# Patient Record
Sex: Male | Born: 1960
Health system: Southern US, Community
[De-identification: ages and names within clinical notes are randomized; demographics above are authoritative.]

## PROBLEM LIST (undated history)

## (undated) DIAGNOSIS — M1990 Unspecified osteoarthritis, unspecified site: Secondary | ICD-10-CM

## (undated) DIAGNOSIS — S8491XA Injury of unspecified nerve at lower leg level, right leg, initial encounter: Secondary | ICD-10-CM

## (undated) DIAGNOSIS — I10 Essential (primary) hypertension: Secondary | ICD-10-CM

## (undated) DIAGNOSIS — T8859XA Other complications of anesthesia, initial encounter: Secondary | ICD-10-CM

## (undated) DIAGNOSIS — I2510 Atherosclerotic heart disease of native coronary artery without angina pectoris: Secondary | ICD-10-CM

## (undated) DIAGNOSIS — S069X9A Unspecified intracranial injury with loss of consciousness of unspecified duration, initial encounter: Secondary | ICD-10-CM

## (undated) DIAGNOSIS — T4145XA Adverse effect of unspecified anesthetic, initial encounter: Secondary | ICD-10-CM

## (undated) DIAGNOSIS — S42102A Fracture of unspecified part of scapula, left shoulder, initial encounter for closed fracture: Secondary | ICD-10-CM

## (undated) DIAGNOSIS — S85001A Unspecified injury of popliteal artery, right leg, initial encounter: Secondary | ICD-10-CM

## (undated) DIAGNOSIS — Z8619 Personal history of other infectious and parasitic diseases: Secondary | ICD-10-CM

## (undated) DIAGNOSIS — S83104A Unspecified dislocation of right knee, initial encounter: Secondary | ICD-10-CM

## (undated) DIAGNOSIS — S82202B Unspecified fracture of shaft of left tibia, initial encounter for open fracture type I or II: Secondary | ICD-10-CM

## (undated) DIAGNOSIS — S32810A Multiple fractures of pelvis with stable disruption of pelvic ring, initial encounter for closed fracture: Secondary | ICD-10-CM

---
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
## 2017-03-25 ENCOUNTER — Encounter (HOSPITAL_BASED_OUTPATIENT_CLINIC_OR_DEPARTMENT_OTHER): Payer: Self-pay | Admitting: Emergency Medicine

## 2017-03-25 ENCOUNTER — Emergency Department (HOSPITAL_BASED_OUTPATIENT_CLINIC_OR_DEPARTMENT_OTHER): Payer: Self-pay

## 2017-03-25 ENCOUNTER — Emergency Department (HOSPITAL_BASED_OUTPATIENT_CLINIC_OR_DEPARTMENT_OTHER)
Admission: EM | Admit: 2017-03-25 | Discharge: 2017-03-25 | Disposition: A | Discharge status: Home / Self Care | Payer: Self-pay | Attending: Emergency Medicine | Admitting: Emergency Medicine

## 2017-03-25 DIAGNOSIS — Z79899 Other long term (current) drug therapy: Secondary | ICD-10-CM | POA: Insufficient documentation

## 2017-03-25 DIAGNOSIS — I10 Essential (primary) hypertension: Secondary | ICD-10-CM | POA: Insufficient documentation

## 2017-03-25 DIAGNOSIS — J209 Acute bronchitis, unspecified: Secondary | ICD-10-CM | POA: Insufficient documentation

## 2017-03-25 HISTORY — DX: Essential (primary) hypertension: I10

## 2017-03-25 MED ORDER — DOXYCYCLINE HYCLATE 100 MG PO CAPS
100.0000 mg | ORAL_CAPSULE | Freq: Two times a day (BID) | ORAL | 0 refills | Status: DC
Start: 2017-03-25 — End: 2019-08-24

## 2017-03-25 MED ORDER — ALBUTEROL SULFATE HFA 108 (90 BASE) MCG/ACT IN AERS
2.0000 | INHALATION_SPRAY | RESPIRATORY_TRACT | Status: DC | PRN
  Administered 2017-03-25: 2 via RESPIRATORY_TRACT
  Filled 2017-03-25: qty 6.7

## 2017-03-25 MED ORDER — IPRATROPIUM-ALBUTEROL 0.5-2.5 (3) MG/3ML IN SOLN
3.0000 mL | RESPIRATORY_TRACT | Status: DC
  Administered 2017-03-25: 3 mL via RESPIRATORY_TRACT
  Filled 2017-03-25: qty 3

## 2017-03-25 MED ORDER — DEXAMETHASONE SODIUM PHOSPHATE 10 MG/ML IJ SOLN
10.0000 mg | Freq: Once | INTRAMUSCULAR | Status: AC
  Administered 2017-03-25: 10 mg via INTRAMUSCULAR
  Filled 2017-03-25: qty 1

## 2017-03-25 MED ORDER — DOXYCYCLINE HYCLATE 100 MG PO TABS
100.0000 mg | ORAL_TABLET | Freq: Once | ORAL | Status: AC
  Administered 2017-03-25: 100 mg via ORAL
  Filled 2017-03-25: qty 1

## 2017-03-25 NOTE — ED Provider Notes (Signed)
[facility] Provider Note: Lowella Dell, MD, FACEP  CSN: [id number] MRN: [id number] ARRIVAL: 03/25/17 at 0331 ROOM: [facility]   CHIEF COMPLAINT  Cough   HISTORY OF PRESENT ILLNESS  Travis Benson is a 56 y.o. male with a two-week history of cough. There was a fever initially but that has resolved. The cough has worsened over the past 2 weeks and is now severe. It occurs in paroxysms. It is exacerbated by deep breathing. It is described as productive. He has taken over-the-counter cough medication without relief. He has had associated headache and sore throat exacerbated by the coughing. He has been having joint pain which has been relieved with ibuprofen. He denies nausea or vomiting. He has had generalized weakness and clamminess.   Past Medical History:  Diagnosis Date  . Hypertension     History reviewed. No pertinent surgical history.  History reviewed. No pertinent family history.  Social History  Substance Use Topics  . Smoking status: Never Smoker  . Smokeless tobacco: Never Used  . Alcohol use No    Prior to Admission medications   Medication Sig Start Date End Date Taking? Authorizing Provider  amLODipine (NORVASC) 10 MG tablet Take 10 mg by mouth daily.   Yes Historical Provider, MD  atorvastatin (LIPITOR) 10 MG tablet Take 10 mg by mouth daily.   Yes Historical Provider, MD    Allergies Patient has no known allergies.   REVIEW OF SYSTEMS  Negative except as noted here or in the History of Present Illness.   PHYSICAL EXAMINATION  Initial Vital Signs Blood pressure (!) 157/84, pulse 76, temperature 98.6 F (37 C), temperature source Oral, resp. rate 20, height 6' (1.829 m), weight 230 lb (104.3 kg), SpO2 98 %.  Examination General: Well-developed, well-nourished male in no acute distress; appearance consistent with age of record HENT: normocephalic; atraumatic; faint palatal petechiae Eyes: pupils equal, round and reactive to light;  extraocular muscles intact Neck: supple Heart: regular rate and rhythm Lungs: Decreased air movement bilaterally; coughing on deep breaths Abdomen: soft; nondistended; nontender; no masses or hepatosplenomegaly; bowel sounds present Extremities: No deformity; full range of motion; pulses normal Neurologic: Awake, alert and oriented; motor function intact in all extremities and symmetric; no facial droop Skin: Warm and dry Psychiatric: Flat affect   RESULTS  Summary of this visit's results, reviewed by myself:   EKG Interpretation  Date/Time:    Ventricular Rate:    PR Interval:    QRS Duration:   QT Interval:    QTC Calculation:   R Axis:     Text Interpretation:        Laboratory Studies: No results found for this or any previous visit (from the past 24 hour(s)). Imaging Studies: Dg Chest 2 View  Result Date: 03/25/2017 CLINICAL DATA:  Intermittent cough for 2 weeks. EXAM: CHEST  2 VIEW COMPARISON:  None. FINDINGS: The cardiomediastinal contours are normal. The lungs are clear. Pulmonary vasculature is normal. No consolidation, pleural effusion, or pneumothorax. No acute osseous abnormalities are seen. IMPRESSION: No active cardiopulmonary disease. Electronically Signed   By: Rubye Oaks M.D.   On: 03/25/2017 05:01    ED COURSE  Nursing notes and initial vitals signs, including pulse oximetry, reviewed.  Vitals:   03/25/17 0338 03/25/17 0340 03/25/17 0417 03/25/17 0418  BP: (!) 157/84     Pulse: 76     Resp: 20     Temp: 98.6 F (37 C)     TempSrc: Oral  SpO2: 98%  98% 97%  Weight:  230 lb (104.3 kg)    Height:  6' (1.829 m)     5:15 AM Air movement improved after [employer] treatment. Patient continues to complain of feeling weak.  PROCEDURES    ED DIAGNOSES     ICD-9-CM ICD-10-CM   1. Acute bronchitis with bronchospasm 466.0 J20.9        Paula LibraJohn Mackenzy Eisenberg, MD 03/25/17 [phone]

## 2017-03-25 NOTE — ED Notes (Signed)
Patient transported to X-ray 

## 2017-03-25 NOTE — ED Triage Notes (Signed)
Patient states that he has a 2 weeks history of intermittent cough. Reports that 2 weeks ago he had a fever but no longer has it. The patient reports that he feels better tonight, but last night he has weakness fatigue and generalizes aches. patient reports that he continues to have the productive cough

## 2019-02-04 ENCOUNTER — Emergency Department (HOSPITAL_BASED_OUTPATIENT_CLINIC_OR_DEPARTMENT_OTHER): Payer: Self-pay

## 2019-02-04 ENCOUNTER — Encounter (HOSPITAL_BASED_OUTPATIENT_CLINIC_OR_DEPARTMENT_OTHER): Payer: Self-pay | Admitting: Emergency Medicine

## 2019-02-04 ENCOUNTER — Other Ambulatory Visit: Payer: Self-pay

## 2019-02-04 ENCOUNTER — Emergency Department (HOSPITAL_BASED_OUTPATIENT_CLINIC_OR_DEPARTMENT_OTHER)
Admission: EM | Admit: 2019-02-04 | Discharge: 2019-02-04 | Disposition: A | Discharge status: Home / Self Care | Payer: Self-pay | Attending: Emergency Medicine | Admitting: Emergency Medicine

## 2019-02-04 DIAGNOSIS — R69 Illness, unspecified: Secondary | ICD-10-CM

## 2019-02-04 DIAGNOSIS — Z79899 Other long term (current) drug therapy: Secondary | ICD-10-CM | POA: Insufficient documentation

## 2019-02-04 DIAGNOSIS — I10 Essential (primary) hypertension: Secondary | ICD-10-CM | POA: Insufficient documentation

## 2019-02-04 DIAGNOSIS — J111 Influenza due to unidentified influenza virus with other respiratory manifestations: Secondary | ICD-10-CM | POA: Insufficient documentation

## 2019-02-04 MED ORDER — GUAIFENESIN 100 MG/5ML PO SOLN
5.0000 mL | Freq: Once | ORAL | Status: AC
  Administered 2019-02-04: 100 mg via ORAL
  Filled 2019-02-04: qty 10

## 2019-02-04 MED ORDER — BENZONATATE 100 MG PO CAPS
200.0000 mg | ORAL_CAPSULE | Freq: Once | ORAL | Status: AC
  Administered 2019-02-04: 200 mg via ORAL
  Filled 2019-02-04: qty 2

## 2019-02-04 MED ORDER — GUAIFENESIN ER 600 MG PO TB12: 600.0000 mg | ORAL_TABLET | Freq: Two times a day (BID) | ORAL | 0 refills | Status: DC

## 2019-02-04 MED ORDER — BENZONATATE 100 MG PO CAPS: 100.0000 mg | ORAL_CAPSULE | Freq: Three times a day (TID) | ORAL | 0 refills | Status: DC

## 2019-02-04 MED ORDER — IBUPROFEN 800 MG PO TABS: 800.0000 mg | ORAL_TABLET | Freq: Three times a day (TID) | ORAL | 0 refills | Status: DC

## 2019-02-04 NOTE — ED Notes (Signed)
PT states understanding of care given, follow up care, and medication prescribed. PT ambulated from ED to car with a steady gait. 

## 2019-02-04 NOTE — ED Provider Notes (Signed)
[facility] EMERGENCY DEPARTMENT Provider Note   CSN: [id number] Arrival date & time: 02/04/19  0409     History   Chief Complaint Chief Complaint  Patient presents with  . Cough    HPI Travis Benson is a 58 y.o. male.  The history is provided by the patient.  Cough  Cough characteristics:  Non-productive Severity:  Mild Onset quality:  Gradual Duration:  5 days Timing:  Intermittent Progression:  Unchanged Chronicity:  New Context: upper respiratory infection   Context: not animal exposure, not exposure to allergens, not fumes, not weather changes and not with activity   Relieved by:  Nothing Worsened by:  Nothing Ineffective treatments: nyquil. Associated symptoms: myalgias   Associated symptoms: no chest pain, no diaphoresis, no ear fullness, no ear pain, no eye discharge, no shortness of breath and no wheezing   Risk factors: no chemical exposure     Past Medical History:  Diagnosis Date  . Hypertension     There are no active problems to display for this patient.   History reviewed. No pertinent surgical history.      Home Medications    Prior to Admission medications   Medication Sig Start Date End Date Taking? Authorizing Provider  amLODipine (NORVASC) 10 MG tablet Take 10 mg by mouth daily.    [provider]  atorvastatin (LIPITOR) 10 MG tablet Take 10 mg by mouth daily.    [provider]  doxycycline (VIBRAMYCIN) 100 MG capsule Take 1 capsule (100 mg total) by mouth 2 (two) times daily. One po bid x 7 days 03/25/17   Molpus, John, MD    Family History Family History  Problem Relation Age of Onset  . Kidney failure Mother   . Diabetes Father     Social History Social History   Tobacco Use  . Smoking status: Never Smoker  . Smokeless tobacco: Never Used  Substance Use Topics  . Alcohol use: No  . Drug use: No     Allergies   Patient has no known allergies.   Review of Systems Review of Systems    Constitutional: Negative for diaphoresis.  HENT: Negative for ear pain.   Eyes: Negative for photophobia and discharge.  Respiratory: Positive for cough. Negative for shortness of breath and wheezing.   Cardiovascular: Negative for chest pain, palpitations and leg swelling.  Gastrointestinal: Negative for diarrhea, nausea and vomiting.  Musculoskeletal: Positive for myalgias. Negative for neck pain and neck stiffness.  All other systems reviewed and are negative.    Physical Exam Updated Vital Signs BP (!) 152/87 (BP Location: Left Arm)   Pulse 90   Temp 100.2 F (37.9 C) (Oral)   Resp 20   Ht 6' (1.829 m)   Wt 108.1 kg   SpO2 96%   BMI 32.31 kg/m   Physical Exam Vitals signs and nursing note reviewed.  Constitutional:      Appearance: Normal appearance.  HENT:     Head: Normocephalic and atraumatic.     Nose: Nose normal.     Mouth/Throat:     Mouth: Mucous membranes are moist.     Pharynx: Oropharynx is clear. No oropharyngeal exudate or posterior oropharyngeal erythema.  Eyes:     Conjunctiva/sclera: Conjunctivae normal.     Pupils: Pupils are equal, round, and reactive to light.  Neck:     Musculoskeletal: Normal range of motion and neck supple.  Cardiovascular:     Rate and Rhythm: Normal rate and regular rhythm.  Pulses: Normal pulses.     Heart sounds: Normal heart sounds.  Pulmonary:     Effort: Pulmonary effort is normal.     Breath sounds: Normal breath sounds.  Abdominal:     General: Abdomen is flat. Bowel sounds are normal.     Tenderness: There is no abdominal tenderness. There is no guarding.  Musculoskeletal: Normal range of motion.  Skin:    General: Skin is warm and dry.     Capillary Refill: Capillary refill takes less than 2 seconds.  Neurological:     General: No focal deficit present.     Mental Status: He is alert and oriented to person, place, and time. Mental status is at baseline.  Psychiatric:        Mood and Affect: Mood  normal.        Behavior: Behavior normal.      ED Treatments / Results  Labs (all labs ordered are listed, but only abnormal results are displayed) Labs Reviewed - No data to display  EKG None  Radiology No results found for this or any previous visit. Dg Chest 2 View  Result Date: 02/04/2019 CLINICAL DATA:  Cough and body aches with fever and generalized weakness for 6 days EXAM: CHEST - 2 VIEW COMPARISON:  03/25/2017 FINDINGS: Normal heart size and mediastinal contours. No acute infiltrate or edema. No effusion or pneumothorax. No acute osseous findings. IMPRESSION: Negative chest. Electronically Signed   By: Marnee SpringJonathon  Watts M.D.   On: 02/04/2019 05:37    Procedures Procedures (including critical care time)  Medications Ordered in ED Medications  benzonatate (TESSALON) capsule 200 mg (has no administration in time range)  guaiFENesin (ROBITUSSIN) 100 MG/5ML solution 100 mg (has no administration in time range)     No PNA, symptoms consistent with flu, but too far out for xofluza or tamiflu.  Symptomatic treatment.  No signs of sepsis.    Final Clinical Impressions(s) / ED Diagnoses   eturn for pain, intractable cough, productive cough,fevers >100.4 unrelieved by medication, shortness of breath, intractable vomiting, or diarrhea, abdominal pain, passing out,Inability to tolerate liquids or food, cough, altered mental status or any concerns. No signs of systemic illness or infection. The patient is nontoxic-appearing on exam and vital signs are within normal limits.   I have reviewed the triage vital signs and the nursing notes. Pertinent labs &imaging results that were available during my care of the patient were reviewed by me and considered in my medical decision making (see chart for details).  After history, exam, and medical workup I feel the patient has been appropriately medically screened and is safe for discharge home. Pertinent diagnoses were discussed with  the patient. Patient was given return precautions.   Shanyah Gattuso, MD 02/04/19 [phone]

## 2019-02-04 NOTE — ED Triage Notes (Signed)
Pt states he has had a cough and body aches  Pt states his cough is nonproductive  Pt is c/o generalized weakness  Sxs started over the weekend and have progressively gotten worse

## 2019-02-28 ENCOUNTER — Emergency Department (HOSPITAL_COMMUNITY): Payer: Medicaid Other

## 2019-02-28 ENCOUNTER — Inpatient Hospital Stay (HOSPITAL_COMMUNITY): Payer: Medicaid Other

## 2019-02-28 ENCOUNTER — Encounter (HOSPITAL_COMMUNITY): Payer: Self-pay | Admitting: Radiology

## 2019-02-28 ENCOUNTER — Emergency Department (HOSPITAL_COMMUNITY): Payer: Medicaid Other | Admitting: Certified Registered Nurse Anesthetist

## 2019-02-28 ENCOUNTER — Inpatient Hospital Stay (HOSPITAL_COMMUNITY)
Admission: EM | Admit: 2019-02-28 | Discharge: 2019-04-28 | DRG: 956 | Disposition: A | Discharge status: Skilled Nursing Facility | Payer: Medicaid Other | Attending: General Surgery | Admitting: General Surgery

## 2019-02-28 ENCOUNTER — Encounter (HOSPITAL_COMMUNITY): Admission: EM | Disposition: A | Discharge status: Skilled Nursing Facility | Payer: Self-pay | Source: Home / Self Care

## 2019-02-28 DIAGNOSIS — R509 Fever, unspecified: Secondary | ICD-10-CM

## 2019-02-28 DIAGNOSIS — B029 Zoster without complications: Secondary | ICD-10-CM | POA: Diagnosis present

## 2019-02-28 DIAGNOSIS — S728X2A Other fracture of left femur, initial encounter for closed fracture: Secondary | ICD-10-CM | POA: Diagnosis not present

## 2019-02-28 DIAGNOSIS — S82402B Unspecified fracture of shaft of left fibula, initial encounter for open fracture type I or II: Secondary | ICD-10-CM | POA: Diagnosis not present

## 2019-02-28 DIAGNOSIS — S81021A Laceration with foreign body, right knee, initial encounter: Secondary | ICD-10-CM | POA: Diagnosis present

## 2019-02-28 DIAGNOSIS — J9601 Acute respiratory failure with hypoxia: Secondary | ICD-10-CM | POA: Diagnosis not present

## 2019-02-28 DIAGNOSIS — S82202B Unspecified fracture of shaft of left tibia, initial encounter for open fracture type I or II: Secondary | ICD-10-CM | POA: Diagnosis not present

## 2019-02-28 DIAGNOSIS — E872 Acidosis: Secondary | ICD-10-CM | POA: Diagnosis present

## 2019-02-28 DIAGNOSIS — S22088A Other fracture of T11-T12 vertebra, initial encounter for closed fracture: Secondary | ICD-10-CM | POA: Diagnosis not present

## 2019-02-28 DIAGNOSIS — S82302B Unspecified fracture of lower end of left tibia, initial encounter for open fracture type I or II: Secondary | ICD-10-CM | POA: Diagnosis present

## 2019-02-28 DIAGNOSIS — S85011A Laceration of popliteal artery, right leg, initial encounter: Secondary | ICD-10-CM | POA: Diagnosis present

## 2019-02-28 DIAGNOSIS — S85091A Other specified injury of popliteal artery, right leg, initial encounter: Secondary | ICD-10-CM

## 2019-02-28 DIAGNOSIS — J9600 Acute respiratory failure, unspecified whether with hypoxia or hypercapnia: Secondary | ICD-10-CM | POA: Diagnosis not present

## 2019-02-28 DIAGNOSIS — N179 Acute kidney failure, unspecified: Secondary | ICD-10-CM | POA: Diagnosis not present

## 2019-02-28 DIAGNOSIS — L723 Sebaceous cyst: Secondary | ICD-10-CM | POA: Diagnosis present

## 2019-02-28 DIAGNOSIS — I10 Essential (primary) hypertension: Secondary | ICD-10-CM | POA: Diagnosis present

## 2019-02-28 DIAGNOSIS — D62 Acute posthemorrhagic anemia: Secondary | ICD-10-CM | POA: Diagnosis present

## 2019-02-28 DIAGNOSIS — S270XXA Traumatic pneumothorax, initial encounter: Secondary | ICD-10-CM

## 2019-02-28 DIAGNOSIS — Y32XXXA Crashing of motor vehicle, undetermined intent, initial encounter: Secondary | ICD-10-CM

## 2019-02-28 DIAGNOSIS — S332XXA Dislocation of sacroiliac and sacrococcygeal joint, initial encounter: Secondary | ICD-10-CM | POA: Diagnosis not present

## 2019-02-28 DIAGNOSIS — S22089A Unspecified fracture of T11-T12 vertebra, initial encounter for closed fracture: Secondary | ICD-10-CM | POA: Diagnosis present

## 2019-02-28 DIAGNOSIS — J15211 Pneumonia due to Methicillin susceptible Staphylococcus aureus: Secondary | ICD-10-CM | POA: Diagnosis not present

## 2019-02-28 DIAGNOSIS — M86161 Other acute osteomyelitis, right tibia and fibula: Secondary | ICD-10-CM | POA: Diagnosis not present

## 2019-02-28 DIAGNOSIS — E870 Hyperosmolality and hypernatremia: Secondary | ICD-10-CM | POA: Diagnosis present

## 2019-02-28 DIAGNOSIS — B966 Bacteroides fragilis [B. fragilis] as the cause of diseases classified elsewhere: Secondary | ICD-10-CM | POA: Diagnosis not present

## 2019-02-28 DIAGNOSIS — S7292XA Unspecified fracture of left femur, initial encounter for closed fracture: Secondary | ICD-10-CM | POA: Diagnosis present

## 2019-02-28 DIAGNOSIS — S8491XA Injury of unspecified nerve at lower leg level, right leg, initial encounter: Secondary | ICD-10-CM | POA: Diagnosis present

## 2019-02-28 DIAGNOSIS — R05 Cough: Secondary | ICD-10-CM | POA: Diagnosis not present

## 2019-02-28 DIAGNOSIS — R410 Disorientation, unspecified: Secondary | ICD-10-CM | POA: Diagnosis not present

## 2019-02-28 DIAGNOSIS — D689 Coagulation defect, unspecified: Secondary | ICD-10-CM | POA: Diagnosis present

## 2019-02-28 DIAGNOSIS — S82202A Unspecified fracture of shaft of left tibia, initial encounter for closed fracture: Secondary | ICD-10-CM

## 2019-02-28 DIAGNOSIS — S329XXA Fracture of unspecified parts of lumbosacral spine and pelvis, initial encounter for closed fracture: Secondary | ICD-10-CM | POA: Diagnosis not present

## 2019-02-28 DIAGNOSIS — T148XXA Other injury of unspecified body region, initial encounter: Secondary | ICD-10-CM

## 2019-02-28 DIAGNOSIS — S83104A Unspecified dislocation of right knee, initial encounter: Secondary | ICD-10-CM | POA: Diagnosis not present

## 2019-02-28 DIAGNOSIS — S82839A Other fracture of upper and lower end of unspecified fibula, initial encounter for closed fracture: Secondary | ICD-10-CM | POA: Diagnosis present

## 2019-02-28 DIAGNOSIS — S4292XA Fracture of left shoulder girdle, part unspecified, initial encounter for closed fracture: Secondary | ICD-10-CM

## 2019-02-28 DIAGNOSIS — L02415 Cutaneous abscess of right lower limb: Secondary | ICD-10-CM | POA: Diagnosis not present

## 2019-02-28 DIAGNOSIS — R0603 Acute respiratory distress: Secondary | ICD-10-CM

## 2019-02-28 DIAGNOSIS — M869 Osteomyelitis, unspecified: Secondary | ICD-10-CM | POA: Diagnosis not present

## 2019-02-28 DIAGNOSIS — G546 Phantom limb syndrome with pain: Secondary | ICD-10-CM | POA: Diagnosis not present

## 2019-02-28 DIAGNOSIS — M79671 Pain in right foot: Secondary | ICD-10-CM | POA: Diagnosis present

## 2019-02-28 DIAGNOSIS — R609 Edema, unspecified: Secondary | ICD-10-CM

## 2019-02-28 DIAGNOSIS — E876 Hypokalemia: Secondary | ICD-10-CM | POA: Diagnosis not present

## 2019-02-28 DIAGNOSIS — S42102A Fracture of unspecified part of scapula, left shoulder, initial encounter for closed fracture: Secondary | ICD-10-CM | POA: Diagnosis not present

## 2019-02-28 DIAGNOSIS — S32810A Multiple fractures of pelvis with stable disruption of pelvic ring, initial encounter for closed fracture: Secondary | ICD-10-CM | POA: Diagnosis present

## 2019-02-28 DIAGNOSIS — S72352A Displaced comminuted fracture of shaft of left femur, initial encounter for closed fracture: Secondary | ICD-10-CM | POA: Diagnosis not present

## 2019-02-28 DIAGNOSIS — I2510 Atherosclerotic heart disease of native coronary artery without angina pectoris: Secondary | ICD-10-CM | POA: Diagnosis present

## 2019-02-28 DIAGNOSIS — S32039A Unspecified fracture of third lumbar vertebra, initial encounter for closed fracture: Secondary | ICD-10-CM | POA: Diagnosis present

## 2019-02-28 DIAGNOSIS — K5900 Constipation, unspecified: Secondary | ICD-10-CM | POA: Diagnosis not present

## 2019-02-28 DIAGNOSIS — S85501A Unspecified injury of popliteal vein, right leg, initial encounter: Secondary | ICD-10-CM | POA: Diagnosis present

## 2019-02-28 DIAGNOSIS — Z9911 Dependence on respirator [ventilator] status: Secondary | ICD-10-CM

## 2019-02-28 DIAGNOSIS — T79A21A Traumatic compartment syndrome of right lower extremity, initial encounter: Secondary | ICD-10-CM | POA: Diagnosis not present

## 2019-02-28 DIAGNOSIS — Y92410 Unspecified street and highway as the place of occurrence of the external cause: Secondary | ICD-10-CM | POA: Diagnosis not present

## 2019-02-28 DIAGNOSIS — B952 Enterococcus as the cause of diseases classified elsewhere: Secondary | ICD-10-CM | POA: Diagnosis not present

## 2019-02-28 DIAGNOSIS — M21371 Foot drop, right foot: Secondary | ICD-10-CM | POA: Diagnosis not present

## 2019-02-28 DIAGNOSIS — B9689 Other specified bacterial agents as the cause of diseases classified elsewhere: Secondary | ICD-10-CM | POA: Diagnosis not present

## 2019-02-28 DIAGNOSIS — T8140XA Infection following a procedure, unspecified, initial encounter: Secondary | ICD-10-CM | POA: Diagnosis not present

## 2019-02-28 DIAGNOSIS — I493 Ventricular premature depolarization: Secondary | ICD-10-CM | POA: Diagnosis present

## 2019-02-28 DIAGNOSIS — Z978 Presence of other specified devices: Secondary | ICD-10-CM

## 2019-02-28 DIAGNOSIS — S72302A Unspecified fracture of shaft of left femur, initial encounter for closed fracture: Secondary | ICD-10-CM | POA: Diagnosis not present

## 2019-02-28 DIAGNOSIS — M2351 Chronic instability of knee, right knee: Secondary | ICD-10-CM | POA: Diagnosis not present

## 2019-02-28 DIAGNOSIS — Z0000 Encounter for general adult medical examination without abnormal findings: Secondary | ICD-10-CM

## 2019-02-28 DIAGNOSIS — S85001A Unspecified injury of popliteal artery, right leg, initial encounter: Secondary | ICD-10-CM | POA: Diagnosis present

## 2019-02-28 DIAGNOSIS — Z0189 Encounter for other specified special examinations: Secondary | ICD-10-CM

## 2019-02-28 DIAGNOSIS — R7881 Bacteremia: Secondary | ICD-10-CM | POA: Diagnosis not present

## 2019-02-28 DIAGNOSIS — I998 Other disorder of circulatory system: Secondary | ICD-10-CM | POA: Diagnosis present

## 2019-02-28 DIAGNOSIS — J9690 Respiratory failure, unspecified, unspecified whether with hypoxia or hypercapnia: Secondary | ICD-10-CM

## 2019-02-28 DIAGNOSIS — R0602 Shortness of breath: Secondary | ICD-10-CM

## 2019-02-28 DIAGNOSIS — R413 Other amnesia: Secondary | ICD-10-CM | POA: Diagnosis present

## 2019-02-28 DIAGNOSIS — S42122A Displaced fracture of acromial process, left shoulder, initial encounter for closed fracture: Secondary | ICD-10-CM | POA: Diagnosis present

## 2019-02-28 DIAGNOSIS — L97104 Non-pressure chronic ulcer of unspecified thigh with necrosis of bone: Secondary | ICD-10-CM | POA: Diagnosis not present

## 2019-02-28 DIAGNOSIS — T8131XA Disruption of external operation (surgical) wound, not elsewhere classified, initial encounter: Secondary | ICD-10-CM | POA: Diagnosis not present

## 2019-02-28 DIAGNOSIS — S065X9A Traumatic subdural hemorrhage with loss of consciousness of unspecified duration, initial encounter: Secondary | ICD-10-CM | POA: Diagnosis present

## 2019-02-28 DIAGNOSIS — S22068A Other fracture of T7-T8 thoracic vertebra, initial encounter for closed fracture: Secondary | ICD-10-CM | POA: Diagnosis not present

## 2019-02-28 DIAGNOSIS — B182 Chronic viral hepatitis C: Secondary | ICD-10-CM

## 2019-02-28 DIAGNOSIS — S82202C Unspecified fracture of shaft of left tibia, initial encounter for open fracture type IIIA, IIIB, or IIIC: Secondary | ICD-10-CM | POA: Diagnosis not present

## 2019-02-28 DIAGNOSIS — M532X8 Spinal instabilities, sacral and sacrococcygeal region: Secondary | ICD-10-CM

## 2019-02-28 DIAGNOSIS — Z23 Encounter for immunization: Secondary | ICD-10-CM

## 2019-02-28 DIAGNOSIS — T1490XA Injury, unspecified, initial encounter: Secondary | ICD-10-CM

## 2019-02-28 DIAGNOSIS — Z419 Encounter for procedure for purposes other than remedying health state, unspecified: Secondary | ICD-10-CM

## 2019-02-28 DIAGNOSIS — M7989 Other specified soft tissue disorders: Secondary | ICD-10-CM | POA: Diagnosis not present

## 2019-02-28 DIAGNOSIS — Z4659 Encounter for fitting and adjustment of other gastrointestinal appliance and device: Secondary | ICD-10-CM

## 2019-02-28 DIAGNOSIS — T796XXA Traumatic ischemia of muscle, initial encounter: Secondary | ICD-10-CM

## 2019-02-28 DIAGNOSIS — R000 Tachycardia, unspecified: Secondary | ICD-10-CM

## 2019-02-28 DIAGNOSIS — R40243 Glasgow coma scale score 3-8, unspecified time: Secondary | ICD-10-CM | POA: Diagnosis present

## 2019-02-28 HISTORY — DX: Injury of unspecified nerve at lower leg level, right leg, initial encounter: S84.91XA

## 2019-02-28 HISTORY — PX: I & D EXTREMITY: SHX5045

## 2019-02-28 HISTORY — DX: Unspecified dislocation of right knee, initial encounter: S83.104A

## 2019-02-28 HISTORY — PX: FASCIOTOMY: SHX132

## 2019-02-28 HISTORY — DX: Essential (primary) hypertension: I10

## 2019-02-28 HISTORY — DX: Unspecified injury of popliteal artery, right leg, initial encounter: S85.001A

## 2019-02-28 HISTORY — PX: FEMORAL-POPLITEAL BYPASS GRAFT: SHX937

## 2019-02-28 HISTORY — DX: Unspecified fracture of shaft of left tibia, initial encounter for open fracture type I or II: S82.202B

## 2019-02-28 HISTORY — DX: Multiple fractures of pelvis with stable disruption of pelvic ring, initial encounter for closed fracture: S32.810A

## 2019-02-28 HISTORY — DX: Personal history of other infectious and parasitic diseases: Z86.19

## 2019-02-28 HISTORY — DX: Atherosclerotic heart disease of native coronary artery without angina pectoris: I25.10

## 2019-02-28 HISTORY — DX: Fracture of unspecified part of scapula, left shoulder, initial encounter for closed fracture: S42.102A

## 2019-02-28 LAB — POCT I-STAT 7, (LYTES, BLD GAS, ICA,H+H)
ACID-BASE DEFICIT: 5 mmol/L — AB (ref 0.0–2.0)
ACID-BASE DEFICIT: 6 mmol/L — AB (ref 0.0–2.0)
Acid-base deficit: 5 mmol/L — ABNORMAL HIGH (ref 0.0–2.0)
Acid-base deficit: 7 mmol/L — ABNORMAL HIGH (ref 0.0–2.0)
Acid-base deficit: 8 mmol/L — ABNORMAL HIGH (ref 0.0–2.0)
Acid-base deficit: 8 mmol/L — ABNORMAL HIGH (ref 0.0–2.0)
BICARBONATE: 18.5 mmol/L — AB (ref 20.0–28.0)
BICARBONATE: 18.5 mmol/L — AB (ref 20.0–28.0)
Bicarbonate: 22.5 mmol/L (ref 20.0–28.0)
Bicarbonate: 19.8 mmol/L — ABNORMAL LOW (ref 20.0–28.0)
Bicarbonate: 19.6 mmol/L — ABNORMAL LOW (ref 20.0–28.0)
Bicarbonate: 18.1 mmol/L — ABNORMAL LOW (ref 20.0–28.0)
Calcium, Ion: 1.14 mmol/L — ABNORMAL LOW (ref 1.15–1.40)
Calcium, Ion: 1.08 mmol/L — ABNORMAL LOW (ref 1.15–1.40)
Calcium, Ion: 1.08 mmol/L — ABNORMAL LOW (ref 1.15–1.40)
Calcium, Ion: 1.11 mmol/L — ABNORMAL LOW (ref 1.15–1.40)
Calcium, Ion: 1.04 mmol/L — ABNORMAL LOW (ref 1.15–1.40)
Calcium, Ion: 1 mmol/L — ABNORMAL LOW (ref 1.15–1.40)
HCT: 27 % — ABNORMAL LOW (ref 39.0–52.0)
HCT: 22 % — ABNORMAL LOW (ref 39.0–52.0)
HCT: 26 % — ABNORMAL LOW (ref 39.0–52.0)
HCT: 22 % — ABNORMAL LOW (ref 39.0–52.0)
HCT: 29 % — ABNORMAL LOW (ref 39.0–52.0)
HCT: 28 % — ABNORMAL LOW (ref 39.0–52.0)
HEMOGLOBIN: 9.2 g/dL — AB (ref 13.0–17.0)
Hemoglobin: 7.5 g/dL — ABNORMAL LOW (ref 13.0–17.0)
Hemoglobin: 8.8 g/dL — ABNORMAL LOW (ref 13.0–17.0)
Hemoglobin: 7.5 g/dL — ABNORMAL LOW (ref 13.0–17.0)
Hemoglobin: 9.9 g/dL — ABNORMAL LOW (ref 13.0–17.0)
Hemoglobin: 9.5 g/dL — ABNORMAL LOW (ref 13.0–17.0)
O2 Saturation: 100 %
O2 Saturation: 100 %
O2 Saturation: 100 %
O2 Saturation: 100 %
O2 Saturation: 100 %
O2 Saturation: 100 %
PH ART: 7.384 (ref 7.350–7.450)
PH ART: 7.242 — AB (ref 7.350–7.450)
POTASSIUM: 4.9 mmol/L (ref 3.5–5.1)
Patient temperature: 35.5
Patient temperature: 35.8
Patient temperature: 36.5
Patient temperature: 36.7
Patient temperature: 36.9
Potassium: 3.8 mmol/L (ref 3.5–5.1)
Potassium: 4.1 mmol/L (ref 3.5–5.1)
Potassium: 4.2 mmol/L (ref 3.5–5.1)
Potassium: 5.8 mmol/L — ABNORMAL HIGH (ref 3.5–5.1)
Potassium: 4.7 mmol/L (ref 3.5–5.1)
Sodium: 141 mmol/L (ref 135–145)
Sodium: 141 mmol/L (ref 135–145)
Sodium: 140 mmol/L (ref 135–145)
Sodium: 141 mmol/L (ref 135–145)
Sodium: 140 mmol/L (ref 135–145)
Sodium: 141 mmol/L (ref 135–145)
TCO2: 24 mmol/L (ref 22–32)
TCO2: 21 mmol/L — ABNORMAL LOW (ref 22–32)
TCO2: 21 mmol/L — ABNORMAL LOW (ref 22–32)
TCO2: 20 mmol/L — AB (ref 22–32)
TCO2: 19 mmol/L — ABNORMAL LOW (ref 22–32)
TCO2: 20 mmol/L — AB (ref 22–32)
pCO2 arterial: 53.5 mmHg — ABNORMAL HIGH (ref 32.0–48.0)
pCO2 arterial: 32.7 mmHg (ref 32.0–48.0)
pCO2 arterial: 35.7 mmHg (ref 32.0–48.0)
pCO2 arterial: 37 mmHg (ref 32.0–48.0)
pCO2 arterial: 38.7 mmHg (ref 32.0–48.0)
pCO2 arterial: 42.9 mmHg (ref 32.0–48.0)
pH, Arterial: 7.233 — ABNORMAL LOW (ref 7.350–7.450)
pH, Arterial: 7.341 — ABNORMAL LOW (ref 7.350–7.450)
pH, Arterial: 7.305 — ABNORMAL LOW (ref 7.350–7.450)
pH, Arterial: 7.275 — ABNORMAL LOW (ref 7.350–7.450)
pO2, Arterial: 455 mmHg — ABNORMAL HIGH (ref 83.0–108.0)
pO2, Arterial: 351 mmHg — ABNORMAL HIGH (ref 83.0–108.0)
pO2, Arterial: 225 mmHg — ABNORMAL HIGH (ref 83.0–108.0)
pO2, Arterial: 216 mmHg — ABNORMAL HIGH (ref 83.0–108.0)
pO2, Arterial: 219 mmHg — ABNORMAL HIGH (ref 83.0–108.0)
pO2, Arterial: 252 mmHg — ABNORMAL HIGH (ref 83.0–108.0)

## 2019-02-28 LAB — CBC
HCT: 30.1 % — ABNORMAL LOW (ref 39.0–52.0)
HEMATOCRIT: 43.1 % (ref 39.0–52.0)
Hemoglobin: 13.4 g/dL (ref 13.0–17.0)
Hemoglobin: 10 g/dL — ABNORMAL LOW (ref 13.0–17.0)
MCH: 29.5 pg (ref 26.0–34.0)
MCH: 29.1 pg (ref 26.0–34.0)
MCHC: 31.1 g/dL (ref 30.0–36.0)
MCHC: 33.2 g/dL (ref 30.0–36.0)
MCV: 94.9 fL (ref 80.0–100.0)
MCV: 87.5 fL (ref 80.0–100.0)
Platelets: 576 10*3/uL — ABNORMAL HIGH (ref 150–400)
Platelets: 219 10*3/uL (ref 150–400)
RBC: 4.54 MIL/uL (ref 4.22–5.81)
RBC: 3.44 MIL/uL — ABNORMAL LOW (ref 4.22–5.81)
RDW: 12.9 % (ref 11.5–15.5)
RDW: 13.5 % (ref 11.5–15.5)
WBC: 20.3 10*3/uL — ABNORMAL HIGH (ref 4.0–10.5)
WBC: 10.8 10*3/uL — ABNORMAL HIGH (ref 4.0–10.5)
nRBC: 0 % (ref 0.0–0.2)
nRBC: 0 % (ref 0.0–0.2)

## 2019-02-28 LAB — URINALYSIS, ROUTINE W REFLEX MICROSCOPIC
Bilirubin Urine: NEGATIVE
Glucose, UA: NEGATIVE mg/dL
Ketones, ur: NEGATIVE mg/dL
Leukocytes,Ua: NEGATIVE
Nitrite: NEGATIVE
Protein, ur: 30 mg/dL — AB
SPECIFIC GRAVITY, URINE: 1.045 — AB (ref 1.005–1.030)
pH: 5 (ref 5.0–8.0)

## 2019-02-28 LAB — COMPREHENSIVE METABOLIC PANEL
ALBUMIN: 3 g/dL — AB (ref 3.5–5.0)
ALT: 50 U/L — ABNORMAL HIGH (ref 0–44)
AST: 84 U/L — AB (ref 15–41)
Alkaline Phosphatase: 126 U/L (ref 38–126)
Anion gap: 11 (ref 5–15)
BUN: 10 mg/dL (ref 6–20)
CO2: 19 mmol/L — AB (ref 22–32)
Calcium: 8 mg/dL — ABNORMAL LOW (ref 8.9–10.3)
Chloride: 108 mmol/L (ref 98–111)
Creatinine, Ser: 1.68 mg/dL — ABNORMAL HIGH (ref 0.61–1.24)
GFR calc Af Amer: 51 mL/min — ABNORMAL LOW (ref >60)
GFR calc non Af Amer: 44 mL/min — ABNORMAL LOW (ref >60)
Glucose, Bld: 243 mg/dL — ABNORMAL HIGH (ref 70–99)
Potassium: 3.1 mmol/L — ABNORMAL LOW (ref 3.5–5.1)
Sodium: 138 mmol/L (ref 135–145)
Total Bilirubin: 0.7 mg/dL (ref 0.3–1.2)
Total Protein: 6.7 g/dL (ref 6.5–8.1)

## 2019-02-28 LAB — CK: Total CK: 2424 U/L — ABNORMAL HIGH (ref 49–397)

## 2019-02-28 LAB — PROTIME-INR
INR: 1.1 (ref 0.8–1.2)
INR: 1.7 — ABNORMAL HIGH (ref 0.8–1.2)
Prothrombin Time: 14.5 seconds (ref 11.4–15.2)
Prothrombin Time: 19.8 seconds — ABNORMAL HIGH (ref 11.4–15.2)

## 2019-02-28 LAB — ETHANOL: Alcohol, Ethyl (B): <10 mg/dL (ref <10)

## 2019-02-28 LAB — LACTIC ACID, PLASMA: LACTIC ACID, VENOUS: 5.1 mmol/L — AB (ref 0.5–1.9)

## 2019-02-28 LAB — CDS SEROLOGY

## 2019-02-28 LAB — ABO/RH: ABO/RH(D): O POS

## 2019-02-28 LAB — PREPARE RBC (CROSSMATCH)

## 2019-02-28 SURGERY — BYPASS GRAFT FEMORAL-POPLITEAL ARTERY
Anesthesia: General | Site: Leg Lower | Laterality: Right

## 2019-02-28 MED ORDER — HEPARIN SODIUM (PORCINE) 1000 UNIT/ML IJ SOLN
INTRAMUSCULAR | Status: DC | PRN
  Administered 2019-02-28: 10000 [IU] via INTRAVENOUS

## 2019-02-28 MED ORDER — ONDANSETRON HCL 4 MG/2ML IJ SOLN
4.0000 mg | Freq: Once | INTRAMUSCULAR | Status: AC
  Administered 2019-02-28: 4 mg via INTRAVENOUS

## 2019-02-28 MED ORDER — SODIUM CHLORIDE 0.9 % IV SOLN
INTRAVENOUS | Status: DC | PRN
  Administered 2019-02-28 (×2): via INTRAVENOUS

## 2019-02-28 MED ORDER — MIDAZOLAM HCL 2 MG/2ML IJ SOLN
INTRAMUSCULAR | Status: AC
  Filled 2019-02-28: qty 2

## 2019-02-28 MED ORDER — 0.9 % SODIUM CHLORIDE (POUR BTL) OPTIME
TOPICAL | Status: DC | PRN
  Administered 2019-02-28: 2000 mL
  Administered 2019-02-28: 1000 mL

## 2019-02-28 MED ORDER — HEMOSTATIC AGENTS (NO CHARGE) OPTIME
TOPICAL | Status: DC | PRN
  Administered 2019-02-28: 1 via TOPICAL

## 2019-02-28 MED ORDER — LACTATED RINGERS IV SOLN
INTRAVENOUS | Status: DC | PRN
  Administered 2019-02-28 (×3): via INTRAVENOUS

## 2019-02-28 MED ORDER — PROPOFOL 10 MG/ML IV BOLUS
INTRAVENOUS | Status: AC
  Filled 2019-02-28: qty 20

## 2019-02-28 MED ORDER — PHENYLEPHRINE 40 MCG/ML (10ML) SYRINGE FOR IV PUSH (FOR BLOOD PRESSURE SUPPORT)
PREFILLED_SYRINGE | INTRAVENOUS | Status: AC
  Filled 2019-02-28: qty 20

## 2019-02-28 MED ORDER — PROPOFOL 500 MG/50ML IV EMUL
INTRAVENOUS | Status: DC | PRN
  Administered 2019-02-28: 50 ug/kg/min via INTRAVENOUS

## 2019-02-28 MED ORDER — FENTANYL CITRATE (PF) 250 MCG/5ML IJ SOLN
INTRAMUSCULAR | Status: AC
  Filled 2019-02-28: qty 5

## 2019-02-28 MED ORDER — ONDANSETRON 4 MG PO TBDP: 4.0000 mg | ORAL_TABLET | Freq: Four times a day (QID) | ORAL | Status: DC | PRN

## 2019-02-28 MED ORDER — FENTANYL CITRATE (PF) 250 MCG/5ML IJ SOLN
INTRAMUSCULAR | Status: AC
  Filled 2019-02-28: qty 10

## 2019-02-28 MED ORDER — ONDANSETRON HCL 4 MG/2ML IJ SOLN
INTRAMUSCULAR | Status: AC
  Administered 2019-02-28: 4 mg via INTRAVENOUS
  Filled 2019-02-28: qty 2

## 2019-02-28 MED ORDER — FENTANYL CITRATE (PF) 250 MCG/5ML IJ SOLN
INTRAMUSCULAR | Status: DC | PRN
  Administered 2019-02-28 (×18): 50 ug via INTRAVENOUS
  Administered 2019-02-28: 100 ug via INTRAVENOUS

## 2019-02-28 MED ORDER — CEFAZOLIN SODIUM-DEXTROSE 2-3 GM-%(50ML) IV SOLR
INTRAVENOUS | Status: DC | PRN
  Administered 2019-02-28: 2 g via INTRAVENOUS

## 2019-02-28 MED ORDER — LACTATED RINGERS IV SOLN
INTRAVENOUS | Status: DC | PRN
  Administered 2019-02-28 (×3): via INTRAVENOUS

## 2019-02-28 MED ORDER — DEXAMETHASONE SODIUM PHOSPHATE 10 MG/ML IJ SOLN
INTRAMUSCULAR | Status: DC | PRN
  Administered 2019-02-28: 10 mg via INTRAVENOUS

## 2019-02-28 MED ORDER — SODIUM CHLORIDE 0.9 % IV SOLN
INTRAVENOUS | Status: AC | PRN
  Administered 2019-02-28: 1000 mL via INTRAVENOUS

## 2019-02-28 MED ORDER — EPINEPHRINE PF 1 MG/10ML IJ SOSY
PREFILLED_SYRINGE | INTRAMUSCULAR | Status: DC | PRN
  Administered 2019-02-28: 0.1 mg via INTRAVENOUS

## 2019-02-28 MED ORDER — MORPHINE SULFATE (PF) 2 MG/ML IV SOLN
2.0000 mg | INTRAVENOUS | Status: DC | PRN
  Administered 2019-03-02: 4 mg via INTRAVENOUS
  Administered 2019-03-03: 2 mg via INTRAVENOUS
  Administered 2019-03-03: 4 mg via INTRAVENOUS
  Filled 2019-02-28 (×3): qty 2
  Filled 2019-02-28: qty 1

## 2019-02-28 MED ORDER — KETAMINE HCL 50 MG/5ML IJ SOSY
PREFILLED_SYRINGE | INTRAMUSCULAR | Status: AC
  Filled 2019-02-28: qty 15

## 2019-02-28 MED ORDER — FENTANYL CITRATE (PF) 100 MCG/2ML IJ SOLN: 50.0000 ug | Freq: Once | INTRAMUSCULAR | Status: DC

## 2019-02-28 MED ORDER — ORAL CARE MOUTH RINSE
15.0000 mL | OROMUCOSAL | Status: DC
  Administered 2019-02-28 – 2019-03-20 (×192): 15 mL via OROMUCOSAL

## 2019-02-28 MED ORDER — SODIUM CHLORIDE 0.9 % IV BOLUS
1000.0000 mL | Freq: Once | INTRAVENOUS | Status: AC
  Administered 2019-02-28: 1000 mL via INTRAVENOUS

## 2019-02-28 MED ORDER — PHENYLEPHRINE HCL 10 MG/ML IJ SOLN
INTRAMUSCULAR | Status: DC | PRN
  Administered 2019-02-28: 12 ug via INTRAVENOUS
  Administered 2019-02-28: 80 ug via INTRAVENOUS
  Administered 2019-02-28 (×2): 120 ug via INTRAVENOUS
  Administered 2019-02-28 (×3): 80 ug via INTRAVENOUS
  Administered 2019-02-28 (×2): 120 ug via INTRAVENOUS
  Administered 2019-02-28: 80 ug via INTRAVENOUS

## 2019-02-28 MED ORDER — PROPOFOL 1000 MG/100ML IV EMUL
INTRAVENOUS | Status: AC
  Filled 2019-02-28: qty 100

## 2019-02-28 MED ORDER — THROMBIN 5000 UNITS EX SOLR
CUTANEOUS | Status: AC
  Filled 2019-02-28: qty 10000

## 2019-02-28 MED ORDER — ONDANSETRON HCL 4 MG/2ML IJ SOLN
4.0000 mg | Freq: Four times a day (QID) | INTRAMUSCULAR | Status: DC | PRN
  Administered 2019-03-02: 4 mg via INTRAVENOUS
  Filled 2019-02-28: qty 2

## 2019-02-28 MED ORDER — MIDAZOLAM HCL 2 MG/2ML IJ SOLN
INTRAMUSCULAR | Status: DC | PRN
  Administered 2019-02-28 (×2): 1 mg via INTRAVENOUS

## 2019-02-28 MED ORDER — CHLORHEXIDINE GLUCONATE 0.12% ORAL RINSE (MEDLINE KIT)
15.0000 mL | Freq: Two times a day (BID) | OROMUCOSAL | Status: DC
  Administered 2019-02-28 – 2019-04-23 (×97): 15 mL via OROMUCOSAL

## 2019-02-28 MED ORDER — FENTANYL CITRATE (PF) 100 MCG/2ML IJ SOLN
INTRAMUSCULAR | Status: AC | PRN
  Administered 2019-02-28: 50 ug via INTRAVENOUS

## 2019-02-28 MED ORDER — FENTANYL 2500MCG IN NS 250ML (10MCG/ML) PREMIX INFUSION
25.0000 ug/h | INTRAVENOUS | Status: DC
  Administered 2019-02-28: 50 ug/h via INTRAVENOUS
  Administered 2019-03-01: 175 ug/h via INTRAVENOUS
  Administered 2019-03-02: 300 ug/h via INTRAVENOUS
  Administered 2019-03-02: 225 ug/h via INTRAVENOUS
  Administered 2019-03-03: 300 ug/h via INTRAVENOUS
  Administered 2019-03-03 (×2): 250 ug/h via INTRAVENOUS
  Administered 2019-03-04: 175 ug/h via INTRAVENOUS
  Administered 2019-03-04: 300 ug/h via INTRAVENOUS
  Administered 2019-03-05: 200 ug/h via INTRAVENOUS
  Administered 2019-03-05: 400 ug/h via INTRAVENOUS
  Administered 2019-03-05: 300 ug/h via INTRAVENOUS
  Administered 2019-03-06 – 2019-03-07 (×5): 400 ug/h via INTRAVENOUS
  Administered 2019-03-07: 300 ug/h via INTRAVENOUS
  Administered 2019-03-07 – 2019-03-08 (×3): 400 ug/h via INTRAVENOUS
  Administered 2019-03-08: 350 ug/h via INTRAVENOUS
  Administered 2019-03-08 – 2019-03-09 (×2): 300 ug/h via INTRAVENOUS
  Administered 2019-03-09: 350 ug/h via INTRAVENOUS
  Administered 2019-03-09 – 2019-03-10 (×2): 300 ug/h via INTRAVENOUS
  Administered 2019-03-10: 200 ug/h via INTRAVENOUS
  Administered 2019-03-10: 300 ug/h via INTRAVENOUS
  Administered 2019-03-11: 200 ug/h via INTRAVENOUS
  Administered 2019-03-11 – 2019-03-12 (×3): 300 ug/h via INTRAVENOUS
  Administered 2019-03-13: 350 ug/h via INTRAVENOUS
  Administered 2019-03-13: 325 ug/h via INTRAVENOUS
  Administered 2019-03-13: 350 ug/h via INTRAVENOUS
  Administered 2019-03-13 – 2019-03-14 (×3): 300 ug/h via INTRAVENOUS
  Administered 2019-03-15: 150 ug/h via INTRAVENOUS
  Filled 2019-02-28 (×45): qty 250

## 2019-02-28 MED ORDER — SODIUM CHLORIDE 0.9 % IV SOLN
INTRAVENOUS | Status: AC
  Filled 2019-02-28: qty 1.2

## 2019-02-28 MED ORDER — FENTANYL BOLUS VIA INFUSION
50.0000 ug | INTRAVENOUS | Status: DC | PRN
  Administered 2019-03-03 (×3): 50 ug via INTRAVENOUS
  Filled 2019-02-28: qty 50

## 2019-02-28 MED ORDER — SODIUM CHLORIDE 0.9 % IV SOLN
INTRAVENOUS | Status: DC | PRN
  Administered 2019-02-28: 500 mL

## 2019-02-28 MED ORDER — FENTANYL CITRATE (PF) 100 MCG/2ML IJ SOLN
INTRAMUSCULAR | Status: AC
  Filled 2019-02-28: qty 2

## 2019-02-28 MED ORDER — TETANUS-DIPHTH-ACELL PERTUSSIS 5-2.5-18.5 LF-MCG/0.5 IM SUSP
0.5000 mL | Freq: Once | INTRAMUSCULAR | Status: AC
  Administered 2019-02-28: 0.5 mL via INTRAMUSCULAR
  Filled 2019-02-28: qty 0.5

## 2019-02-28 MED ORDER — FENTANYL CITRATE (PF) 100 MCG/2ML IJ SOLN: 100.0000 ug | Freq: Once | INTRAMUSCULAR | Status: DC

## 2019-02-28 MED ORDER — PROPOFOL 1000 MG/100ML IV EMUL
0.0000 ug/kg/min | INTRAVENOUS | Status: DC
  Administered 2019-02-28 – 2019-03-01 (×2): 30 ug/kg/min via INTRAVENOUS
  Administered 2019-03-01: 25 ug/kg/min via INTRAVENOUS
  Administered 2019-03-01: 20 ug/kg/min via INTRAVENOUS
  Administered 2019-03-01: 35 ug/kg/min via INTRAVENOUS
  Administered 2019-03-01: 20 ug/kg/min via INTRAVENOUS
  Administered 2019-03-02: 40 ug/kg/min via INTRAVENOUS
  Administered 2019-03-02: 35 ug/kg/min via INTRAVENOUS
  Administered 2019-03-02: 30 ug/kg/min via INTRAVENOUS
  Administered 2019-03-02: 50 ug/kg/min via INTRAVENOUS
  Administered 2019-03-02: 40 ug/kg/min via INTRAVENOUS
  Administered 2019-03-03 (×2): 45 ug/kg/min via INTRAVENOUS
  Administered 2019-03-03: 35 ug/kg/min via INTRAVENOUS
  Administered 2019-03-03 – 2019-03-04 (×8): 50 ug/kg/min via INTRAVENOUS
  Administered 2019-03-04: 40 ug/kg/min via INTRAVENOUS
  Administered 2019-03-05: 50 ug/kg/min via INTRAVENOUS
  Administered 2019-03-05: 40 ug/kg/min via INTRAVENOUS
  Administered 2019-03-05: 10:00:00 via INTRAVENOUS
  Administered 2019-03-05 (×2): 50 ug/kg/min via INTRAVENOUS
  Administered 2019-03-05 (×2): 45 ug/kg/min via INTRAVENOUS
  Administered 2019-03-06 – 2019-03-08 (×17): 50 ug/kg/min via INTRAVENOUS
  Administered 2019-03-08: 40 ug/kg/min via INTRAVENOUS
  Administered 2019-03-08: 25 ug/kg/min via INTRAVENOUS
  Administered 2019-03-08: 50 ug/kg/min via INTRAVENOUS
  Administered 2019-03-09 (×3): 30 ug/kg/min via INTRAVENOUS
  Administered 2019-03-09 – 2019-03-10 (×4): 40 ug/kg/min via INTRAVENOUS
  Administered 2019-03-10: 30 ug/kg/min via INTRAVENOUS
  Administered 2019-03-10: 40 ug/kg/min via INTRAVENOUS
  Administered 2019-03-10: 20 ug/kg/min via INTRAVENOUS
  Administered 2019-03-11 – 2019-03-12 (×5): 30 ug/kg/min via INTRAVENOUS
  Administered 2019-03-12: 40.08 ug/kg/min via INTRAVENOUS
  Administered 2019-03-12 (×2): 20 ug/kg/min via INTRAVENOUS
  Administered 2019-03-13: 40 ug/kg/min via INTRAVENOUS
  Administered 2019-03-13 (×2): 25 ug/kg/min via INTRAVENOUS
  Administered 2019-03-13: 20 ug/kg/min via INTRAVENOUS
  Administered 2019-03-14: 25 ug/kg/min via INTRAVENOUS
  Administered 2019-03-14: 20 ug/kg/min via INTRAVENOUS
  Administered 2019-03-14: 15 ug/kg/min via INTRAVENOUS
  Filled 2019-02-28 (×12): qty 100
  Filled 2019-02-28: qty 200
  Filled 2019-02-28 (×6): qty 100
  Filled 2019-02-28: qty 200
  Filled 2019-02-28 (×12): qty 100
  Filled 2019-02-28: qty 200
  Filled 2019-02-28 (×24): qty 100
  Filled 2019-02-28: qty 200
  Filled 2019-02-28 (×5): qty 100
  Filled 2019-02-28: qty 200
  Filled 2019-02-28 (×4): qty 100

## 2019-02-28 MED ORDER — SODIUM CHLORIDE 0.9 % IV SOLN
INTRAVENOUS | Status: DC
  Administered 2019-03-05 – 2019-04-13 (×17): via INTRAVENOUS

## 2019-02-28 MED ORDER — ROCURONIUM BROMIDE 10 MG/ML (PF) SYRINGE
PREFILLED_SYRINGE | INTRAVENOUS | Status: DC | PRN
  Administered 2019-02-28 (×3): 50 mg via INTRAVENOUS

## 2019-02-28 MED ORDER — ALBUMIN HUMAN 5 % IV SOLN
INTRAVENOUS | Status: DC | PRN
  Administered 2019-02-28 (×3): via INTRAVENOUS

## 2019-02-28 MED ORDER — DEXTROSE-NACL 5-0.45 % IV SOLN
INTRAVENOUS | Status: DC
  Administered 2019-02-28 – 2019-03-05 (×9): via INTRAVENOUS

## 2019-02-28 MED ORDER — IOHEXOL 350 MG/ML SOLN
150.0000 mL | Freq: Once | INTRAVENOUS | Status: AC | PRN
  Administered 2019-02-28: 150 mL via INTRAVENOUS

## 2019-02-28 MED ORDER — SODIUM BICARBONATE 8.4 % IV SOLN
INTRAVENOUS | Status: DC | PRN
  Administered 2019-02-28: 50 meq via INTRAVENOUS

## 2019-02-28 MED ORDER — PROTAMINE SULFATE 10 MG/ML IV SOLN
INTRAVENOUS | Status: DC | PRN
  Administered 2019-02-28: 25 mg via INTRAVENOUS

## 2019-02-28 MED ORDER — DOCUSATE SODIUM 100 MG PO CAPS
100.0000 mg | ORAL_CAPSULE | Freq: Two times a day (BID) | ORAL | Status: DC
  Filled 2019-02-28: qty 1

## 2019-02-28 MED ORDER — SODIUM CHLORIDE 0.9 % IV SOLN
INTRAVENOUS | Status: DC | PRN
  Administered 2019-02-28: 50 ug/min via INTRAVENOUS

## 2019-02-28 MED ORDER — ACETAMINOPHEN 325 MG PO TABS
650.0000 mg | ORAL_TABLET | ORAL | Status: DC | PRN
  Administered 2019-03-10 – 2019-03-14 (×4): 650 mg via ORAL
  Filled 2019-02-28 (×4): qty 2

## 2019-02-28 MED ORDER — FENTANYL CITRATE (PF) 100 MCG/2ML IJ SOLN
INTRAMUSCULAR | Status: AC | PRN
  Administered 2019-02-28: 100 ug via INTRAVENOUS

## 2019-02-28 MED ORDER — SUCCINYLCHOLINE CHLORIDE 200 MG/10ML IV SOSY
PREFILLED_SYRINGE | INTRAVENOUS | Status: AC
  Filled 2019-02-28: qty 10

## 2019-02-28 MED ORDER — SODIUM CHLORIDE 0.9% IV SOLUTION: Freq: Once | INTRAVENOUS | Status: DC

## 2019-02-28 MED ORDER — PROPOFOL 10 MG/ML IV BOLUS
INTRAVENOUS | Status: DC | PRN
  Administered 2019-02-28: 120 mg via INTRAVENOUS

## 2019-02-28 MED ORDER — SODIUM CHLORIDE 0.9 % IR SOLN
Status: DC | PRN
  Administered 2019-02-28: 3000 mL

## 2019-02-28 MED ORDER — SUCCINYLCHOLINE CHLORIDE 200 MG/10ML IV SOSY
PREFILLED_SYRINGE | INTRAVENOUS | Status: DC | PRN
  Administered 2019-02-28: 200 mg via INTRAVENOUS

## 2019-02-28 MED ORDER — PHENYLEPHRINE HCL-NACL 10-0.9 MG/250ML-% IV SOLN
0.0000 ug/min | INTRAVENOUS | Status: DC
  Administered 2019-03-01 (×2): 200 ug/min via INTRAVENOUS
  Administered 2019-03-01: 20 ug/min via INTRAVENOUS
  Administered 2019-03-01: 200 ug/min via INTRAVENOUS
  Filled 2019-02-28: qty 500
  Filled 2019-02-28: qty 250
  Filled 2019-02-28: qty 500

## 2019-02-28 SURGICAL SUPPLY — 126 items
BANDAGE ACE 4X5 VEL STRL LF (GAUZE/BANDAGES/DRESSINGS) ×5 IMPLANT
BANDAGE ACE 6X5 VEL STRL LF (GAUZE/BANDAGES/DRESSINGS) ×5 IMPLANT
BANDAGE ELASTIC 4 VELCRO ST LF (GAUZE/BANDAGES/DRESSINGS) ×5 IMPLANT
BANDAGE ESMARK 6X9 LF (GAUZE/BANDAGES/DRESSINGS) IMPLANT
BIT DRILL 2.5X2.75 QC CALB (BIT) ×5 IMPLANT
BNDG COHESIVE 6X5 TAN STRL LF (GAUZE/BANDAGES/DRESSINGS) ×5 IMPLANT
BNDG ESMARK 6X9 LF (GAUZE/BANDAGES/DRESSINGS)
BNDG GAUZE ELAST 4 BULKY (GAUZE/BANDAGES/DRESSINGS) ×5 IMPLANT
CANISTER SUCT 3000ML PPV (MISCELLANEOUS) ×5 IMPLANT
CANISTER WOUND CARE 500ML ATS (WOUND CARE) ×15 IMPLANT
CANNULA VESSEL 3MM 2 BLNT TIP (CANNULA) ×5 IMPLANT
CATH EMB 3FR 40CM (CATHETERS) ×5 IMPLANT
CLEANER TIP ELECTROSURG 2X2 (MISCELLANEOUS) ×5 IMPLANT
CLIP VESOCCLUDE MED 24/CT (CLIP) ×5 IMPLANT
CLIP VESOCCLUDE SM WIDE 24/CT (CLIP) ×10 IMPLANT
CLIP VESOCCLUDE SM WIDE 6/CT (CLIP) ×15 IMPLANT
CLOSURE WOUND 1/2 X4 (GAUZE/BANDAGES/DRESSINGS)
CONNECTOR Y ATS VAC SYSTEM (MISCELLANEOUS) ×5 IMPLANT
COVER SURGICAL LIGHT HANDLE (MISCELLANEOUS) ×5 IMPLANT
COVER WAND RF STERILE (DRAPES) ×10 IMPLANT
CUFF TOURNIQUET SINGLE 18IN (TOURNIQUET CUFF) ×5 IMPLANT
CUFF TOURNIQUET SINGLE 24IN (TOURNIQUET CUFF) IMPLANT
CUFF TOURNIQUET SINGLE 34IN LL (TOURNIQUET CUFF) IMPLANT
CUFF TOURNIQUET SINGLE 44IN (TOURNIQUET CUFF) IMPLANT
DERMABOND ADVANCED (GAUZE/BANDAGES/DRESSINGS) ×2
DERMABOND ADVANCED .7 DNX12 (GAUZE/BANDAGES/DRESSINGS) ×3 IMPLANT
DRAIN CHANNEL 15F RND FF W/TCR (WOUND CARE) IMPLANT
DRAPE C-ARM 42X72 X-RAY (DRAPES) ×5 IMPLANT
DRAPE HALF SHEET 40X57 (DRAPES) ×5 IMPLANT
DRAPE SURG 17X23 STRL (DRAPES) ×5 IMPLANT
DRAPE U-SHAPE 47X51 STRL (DRAPES) ×5 IMPLANT
DRAPE X-RAY CASS 24X20 (DRAPES) IMPLANT
DRSG COVADERM 4X10 (GAUZE/BANDAGES/DRESSINGS) ×5 IMPLANT
DRSG COVADERM 4X6 (GAUZE/BANDAGES/DRESSINGS) ×5 IMPLANT
DRSG COVADERM 4X8 (GAUZE/BANDAGES/DRESSINGS) ×5 IMPLANT
DRSG EMULSION OIL 3X3 NADH (GAUZE/BANDAGES/DRESSINGS) ×5 IMPLANT
DRSG PAD ABDOMINAL 8X10 ST (GAUZE/BANDAGES/DRESSINGS) ×5 IMPLANT
DRSG VAC ATS LRG SENSATRAC (GAUZE/BANDAGES/DRESSINGS) ×5 IMPLANT
DURAPREP 26ML APPLICATOR (WOUND CARE) IMPLANT
ELECT REM PT RETURN 9FT ADLT (ELECTROSURGICAL) ×5
ELECTRODE REM PT RTRN 9FT ADLT (ELECTROSURGICAL) ×3 IMPLANT
EVACUATOR 1/8 PVC DRAIN (DRAIN) IMPLANT
EVACUATOR SILICONE 100CC (DRAIN) IMPLANT
FACESHIELD WRAPAROUND (MASK) ×5 IMPLANT
GAUZE SPONGE 4X4 12PLY STRL (GAUZE/BANDAGES/DRESSINGS) ×5 IMPLANT
GAUZE SPONGE 4X4 12PLY STRL LF (GAUZE/BANDAGES/DRESSINGS) ×5 IMPLANT
GAUZE XEROFORM 1X8 LF (GAUZE/BANDAGES/DRESSINGS) ×5 IMPLANT
GAUZE XEROFORM 5X9 LF (GAUZE/BANDAGES/DRESSINGS) ×5 IMPLANT
GLOVE BIO SURGEON STRL SZ7.5 (GLOVE) ×5 IMPLANT
GLOVE BIOGEL PI IND STRL 7.5 (GLOVE) ×9 IMPLANT
GLOVE BIOGEL PI IND STRL 8 (GLOVE) ×3 IMPLANT
GLOVE BIOGEL PI INDICATOR 7.5 (GLOVE) ×6
GLOVE BIOGEL PI INDICATOR 8 (GLOVE) ×2
GLOVE ECLIPSE 8.0 STRL XLNG CF (GLOVE) ×5 IMPLANT
GLOVE ORTHO TXT STRL SZ7.5 (GLOVE) ×25 IMPLANT
GLOVE SURG ORTHO 8.0 STRL STRW (GLOVE) ×5 IMPLANT
GOWN STRL REIN 3XL XLG LVL4 (GOWN DISPOSABLE) ×5 IMPLANT
GOWN STRL REUS W/ TWL LRG LVL3 (GOWN DISPOSABLE) ×15 IMPLANT
GOWN STRL REUS W/ TWL XL LVL3 (GOWN DISPOSABLE) ×3 IMPLANT
GOWN STRL REUS W/TWL LRG LVL3 (GOWN DISPOSABLE) ×10
GOWN STRL REUS W/TWL XL LVL3 (GOWN DISPOSABLE) ×2
HANDPIECE INTERPULSE COAX TIP (DISPOSABLE)
HEMOSTAT SNOW SURGICEL 2X4 (HEMOSTASIS) ×5 IMPLANT
IMMOBILIZER KNEE 20 (SOFTGOODS) ×5
IMMOBILIZER KNEE 20 THIGH 36 (SOFTGOODS) ×3 IMPLANT
INSERT FOGARTY SM (MISCELLANEOUS) IMPLANT
KIT BASIN OR (CUSTOM PROCEDURE TRAY) ×10 IMPLANT
KIT TURNOVER KIT B (KITS) ×5 IMPLANT
MANIFOLD NEPTUNE II (INSTRUMENTS) ×5 IMPLANT
MARKER GRAFT CORONARY BYPASS (MISCELLANEOUS) IMPLANT
NEEDLE 22X1 1/2 (OR ONLY) (NEEDLE) IMPLANT
NS IRRIG 1000ML POUR BTL (IV SOLUTION) ×10 IMPLANT
PACK ORTHO EXTREMITY (CUSTOM PROCEDURE TRAY) ×5 IMPLANT
PACK PERIPHERAL VASCULAR (CUSTOM PROCEDURE TRAY) ×5 IMPLANT
PAD ABD 8X10 STRL (GAUZE/BANDAGES/DRESSINGS) ×5 IMPLANT
PAD ARMBOARD 7.5X6 YLW CONV (MISCELLANEOUS) ×20 IMPLANT
PAD CAST 4YDX4 CTTN HI CHSV (CAST SUPPLIES) ×3 IMPLANT
PAD NEG PRESSURE SENSATRAC (MISCELLANEOUS) ×5 IMPLANT
PADDING CAST COTTON 4X4 STRL (CAST SUPPLIES) ×2
PADDING CAST COTTON 6X4 STRL (CAST SUPPLIES) ×5 IMPLANT
PLATE ACE 100DEG 6HOLE (Plate) ×5 IMPLANT
SCREW CORTICAL 3.5MM  28MM (Screw) ×4 IMPLANT
SCREW CORTICAL 3.5MM  30MM (Screw) ×2 IMPLANT
SCREW CORTICAL 3.5MM 28MM (Screw) ×6 IMPLANT
SCREW CORTICAL 3.5MM 30MM (Screw) ×3 IMPLANT
SET COLLECT BLD 21X3/4 12 (NEEDLE) IMPLANT
SET HNDPC FAN SPRY TIP SCT (DISPOSABLE) IMPLANT
SPLINT PLASTER CAST XFAST 5X30 (CAST SUPPLIES) ×3 IMPLANT
SPLINT PLASTER XFAST SET 5X30 (CAST SUPPLIES) ×2
SPONGE LAP 18X18 RF (DISPOSABLE) ×5 IMPLANT
SPONGE LAP 18X18 X RAY DECT (DISPOSABLE) ×5 IMPLANT
SPONGE LAP 4X18 RFD (DISPOSABLE) ×5 IMPLANT
STAPLER VISISTAT 35W (STAPLE) ×5 IMPLANT
STOCKINETTE IMPERVIOUS 9X36 MD (GAUZE/BANDAGES/DRESSINGS) ×5 IMPLANT
STOCKINETTE IMPERVIOUS LG (DRAPES) ×10 IMPLANT
STOPCOCK 4 WAY LG BORE MALE ST (IV SETS) IMPLANT
STRIP CLOSURE SKIN 1/2X4 (GAUZE/BANDAGES/DRESSINGS) IMPLANT
SUCTION FRAZIER HANDLE 10FR (MISCELLANEOUS)
SUCTION TUBE FRAZIER 10FR DISP (MISCELLANEOUS) IMPLANT
SUT ETHILON 2 0 FS 18 (SUTURE) ×15 IMPLANT
SUT ETHILON 3 0 PS 1 (SUTURE) IMPLANT
SUT MNCRL AB 4-0 PS2 18 (SUTURE) ×10 IMPLANT
SUT PROLENE 5 0 C 1 24 (SUTURE) ×20 IMPLANT
SUT PROLENE 6 0 BV (SUTURE) ×45 IMPLANT
SUT PROLENE 7 0 BV 1 (SUTURE) IMPLANT
SUT SILK 2 0 PERMA HAND 18 BK (SUTURE) ×5 IMPLANT
SUT SILK 2 0 SH (SUTURE) IMPLANT
SUT SILK 3 0 (SUTURE) ×2
SUT SILK 3-0 18XBRD TIE 12 (SUTURE) ×3 IMPLANT
SUT VIC AB 0 CT1 27 (SUTURE)
SUT VIC AB 0 CT1 27XBRD ANBCTR (SUTURE) IMPLANT
SUT VIC AB 2-0 CT1 27 (SUTURE) ×8
SUT VIC AB 2-0 CT1 TAPERPNT 27 (SUTURE) ×12 IMPLANT
SUT VIC AB 3-0 SH 27 (SUTURE) ×8
SUT VIC AB 3-0 SH 27X BRD (SUTURE) ×12 IMPLANT
SWAB CULTURE ESWAB REG 1ML (MISCELLANEOUS) IMPLANT
SYR CONTROL 10ML LL (SYRINGE) IMPLANT
TOWEL GREEN STERILE (TOWEL DISPOSABLE) ×5 IMPLANT
TOWEL OR 17X24 6PK STRL BLUE (TOWEL DISPOSABLE) ×10 IMPLANT
TOWEL OR 17X26 10 PK STRL BLUE (TOWEL DISPOSABLE) ×10 IMPLANT
TRAY FOLEY MTR SLVR 16FR STAT (SET/KITS/TRAYS/PACK) ×5 IMPLANT
TUBE CONNECTING 12'X1/4 (SUCTIONS) ×1
TUBE CONNECTING 12X1/4 (SUCTIONS) ×4 IMPLANT
UNDERPAD 30X30 (UNDERPADS AND DIAPERS) ×10 IMPLANT
WATER STERILE IRR 1000ML POUR (IV SOLUTION) ×15 IMPLANT
YANKAUER SUCT BULB TIP NO VENT (SUCTIONS) ×5 IMPLANT
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
## 2019-02-28 NOTE — Anesthesia Procedure Notes (Signed)
Arterial Line Insertion Start/End3/12/2018 3:15 PM, 02/28/2019 3:20 PM Performed by: Dairl Ponder, CRNA  Patient location: Pre-op. Preanesthetic checklist: patient identified, IV checked, site marked, risks and benefits discussed, surgical consent, monitors and equipment checked, pre-op evaluation, timeout performed and anesthesia consent Lidocaine 1% used for infiltration Left, radial was placed Catheter size: 20 Fr Hand hygiene performed  and maximum sterile barriers used   Attempts: 1 Procedure performed without using ultrasound guided technique. Following insertion, dressing applied. Post procedure assessment: normal and unchanged
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
## 2019-02-28 NOTE — Brief Op Note (Signed)
02/28/2019  9:14 PM  PATIENT:  Travis Benson  58 y.o. male  PRE-OPERATIVE DIAGNOSIS:  1. Closed left femur fracture, 2.  Grade II open left tibia/fibula fracture, 3. Right knee dislocation with vascular injury  POST-OPERATIVE DIAGNOSIS:  1. Closed left femur fracture, 2.  Grade II open left tibia/fibula fracture, 3. Right knee dislocation with vascular injury  PROCEDURE:  Procedure(s): BYPASS GRAFT OF ABOVE KNEE POPLITEAL- BELOW KNEE POPLITEAL ARTERY (Right) IRRIGATION AND DEBRIDEMENT OF LEFT LOWER LEG /INTERNAL FIXATION OF LEFT TIBIA PLACEMENT OF FRACTURE PINLEFT FEMUR/ CLOSED REDUCTION OF LEFT FEMUR. (Left) FOUR COMPARTMENT FASCIOTOMY OF RIGHT LOWER LEG (Right)  Closed treatment of right knee dislocation  SURGEON:  Surgeon(s) and Role: Panel 1:    * Maeola Harman, MD - Primary Panel 2:    * Durene Romans, MD - Primary  PHYSICIAN ASSISTANT: None  ANESTHESIA:   general  EBL:  1500 mL   BLOOD ADMINISTERED: per anesthetic record  DRAINS: none   LOCAL MEDICATIONS USED:  NONE  SPECIMEN:  No Specimen  DISPOSITION OF SPECIMEN:  N/A  COUNTS:  YES  TOURNIQUET:  * No tourniquets in log *  DICTATION: .Other Dictation: Dictation Number [id number]  PLAN OF CARE: Admit to inpatient   PATIENT DISPOSITION:  ICU - intubated and hemodynamically stable.   Delay start of Pharmacological VTE agent (>24hrs) due to surgical blood loss or risk of bleeding: yes
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
## 2019-02-28 NOTE — ED Notes (Addendum)
Paged vascular per Dr. Lockie Mola- Dr.Cain
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
## 2019-02-28 NOTE — ED Notes (Signed)
Ortho surgeon at bedside 

## 2019-02-28 NOTE — Anesthesia Procedure Notes (Signed)
Central Venous Catheter Insertion Performed by: Annye Asa, MD, anesthesiologist Start/End3/12/2018 9:13 PM, 02/28/2019 9:29 PM Patient location: OR. Preanesthetic checklist: patient identified, IV checked, risks and benefits discussed, monitors and equipment checked, pre-op evaluation, timeout performed and anesthesia consent Position: supine Hand hygiene performed , maximum sterile barriers used  and Seldinger technique used Catheter size: 8 Fr Total catheter length 16. Central line was placed.Double lumen Procedure performed using ultrasound guided technique. Ultrasound Notes:anatomy identified, needle tip was noted to be adjacent to the nerve/plexus identified, no ultrasound evidence of intravascular and/or intraneural injection and image(s) printed for medical record Attempts: 1 Following insertion, line sutured, dressing applied and Biopatch. Post procedure assessment: blood return through all ports, free fluid flow and no air  Patient tolerated the procedure well with no immediate complications. Additional procedure comments: CVP: Timeout, sterile prep, drape, FBP R neck.  Supine position.  finder and trocar RIJ 1st pass with US guidance.  2 lumen placed over J wire. Biopatch and sterile dressing on.  Patient tolerated well.  VSS.  Jenita Seashore, MD.

## 2019-02-28 NOTE — Progress Notes (Signed)
   02/28/19 1200  Clinical Encounter Type  Visited With Health care provider  Visit Type ED  Providing the [facility]. Made contact with wife and she is on her way. Patient is currently getting x-rays. Follow up as requested.

## 2019-02-28 NOTE — ED Notes (Signed)
I sent down three golds tops

## 2019-02-28 NOTE — ED Notes (Signed)
Paged ortho per Md Curatolo- Dr. Charlann Boxer

## 2019-02-28 NOTE — ED Notes (Signed)
Repeat page to vascular - Dr.Cain On call, Per MD [facility]

## 2019-02-28 NOTE — Anesthesia Postprocedure Evaluation (Addendum)
Anesthesia Post Note  Patient: Daryl Javorsky  Procedure(s) Performed: BYPASS GRAFT OF ABOVE KNEE POPLITEAL- BELOW KNEE POPLITEAL ARTERY (Right ) FOUR COMPARTMENT FASCIOTOMY OF RIGHT LOWER LEG (Right ) IRRIGATION AND DEBRIDEMENT OF LEFT LOWER LEG /INTERNAL FIXATION OF LEFT TIBIA PLACEMENT OF FRACTURE PINLEFT FEMUR/ CLOSED REDUCTION OF LEFT FEMUR. (Left Leg Lower)     Patient location during evaluation: ICU Anesthesia Type: General Level of consciousness: sedated and patient remains intubated per anesthesia plan Pain management: pain level controlled Vital Signs Assessment: post-procedure vital signs reviewed and stable Respiratory status: patient remains intubated per anesthesia plan and patient on ventilator - see flowsheet for VS Cardiovascular status: stable (BP stable) Postop Assessment: no apparent nausea or vomiting Anesthetic complications: no Comments: Pt remains critically ill with vascular injury to RLE, continuing transfusion    Last Vitals:  Vitals:   02/28/19 2210 02/28/19 2307  BP:    Pulse: (!) 107 (!) 114  Resp: 17 17  Temp:    SpO2: 100% 100%    Last Pain:  Vitals:   02/28/19 1353  TempSrc:   PainSc: 10-Worst pain ever                 Chaquana Nichols,E. Deasia Chiu

## 2019-02-28 NOTE — Progress Notes (Signed)
Orthopedic Tech Progress Note Patient Details:  Travis Benson [birth year] [id number] Level 1 trauma, not needed at the moment Patient ID: Travis Benson, male   DOB: 1961-11-26, 58 y.o.   MRN: [id number]   Travis Benson 02/28/2019, 1:04 PM

## 2019-02-28 NOTE — H&P (Signed)
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 right foot without dopplerable pulse, left tib/fib fx with wound, left comminuted femur fracture, concern for left scapula fx -CT HCCAP + b/l CTA Ischemic right foot-consult vasc Randie Heinz) Right comm. Femur fx, right tib fib fx, concern for SI joint widening and Pubic symphysis widening-Consult ortho (  Charlann Boxer) t8 and t12 body fx- Consult NSG Lovell Sheehan) Lumbar TP fxs- pain control -admit to trauma service after initial resuscitation and any necessary ortho/vasc intervention  Procedures: Placement of left posterior splint  De Blanch  03/02/2019, 7:24 AM

## 2019-02-28 NOTE — Transfer of Care (Signed)
Immediate Anesthesia Transfer of Care Note  Patient: Travis Benson  Procedure(s) Performed: BYPASS GRAFT OF ABOVE KNEE POPLITEAL- BELOW KNEE POPLITEAL ARTERY (Right ) FOUR COMPARTMENT FASCIOTOMY OF RIGHT LOWER LEG (Right ) IRRIGATION AND DEBRIDEMENT OF LEFT LOWER LEG /INTERNAL FIXATION OF LEFT TIBIA PLACEMENT OF FRACTURE PINLEFT FEMUR/ CLOSED REDUCTION OF LEFT FEMUR. (Left Leg Lower)  Patient Location: ICU  Anesthesia Type:General  Level of Consciousness: sedated and Patient remains intubated per anesthesia plan  Airway & Oxygen Therapy: Patient remains intubated per anesthesia plan and Patient placed on Ventilator (see vital sign flow sheet for setting)  Post-op Assessment: Report given to RN and Post -op Vital signs reviewed and stable  Post vital signs: Reviewed and stable  Last Vitals:  Vitals Value Taken Time  BP    Temp    Pulse    Resp    SpO2      Last Pain:  Vitals:   02/28/19 1353  TempSrc:   PainSc: 10-Worst pain ever         Complications: No apparent anesthesia complications

## 2019-02-28 NOTE — ED Notes (Signed)
Family at beside. Family given emotional support. 

## 2019-02-28 NOTE — ED Notes (Signed)
Patient transported to CT 

## 2019-02-28 NOTE — ED Notes (Signed)
Vascular surgeon at bedside.

## 2019-02-28 NOTE — Anesthesia Procedure Notes (Signed)
Procedure Name: Intubation Date/Time: 02/28/2019 3:22 PM Performed by: Dairl Ponder, CRNA Pre-anesthesia Checklist: Patient identified, Emergency Drugs available, Suction available, Patient being monitored and Timeout performed Patient Re-evaluated:Patient Re-evaluated prior to induction Oxygen Delivery Method: Circle system utilized Preoxygenation: Pre-oxygenation with 100% oxygen Induction Type: IV induction and Rapid sequence Laryngoscope Size: 4 and Glidescope (limited neck ROM) Grade View: Grade I Tube type: Oral Tube size: 7.5 mm Number of attempts: 1 Airway Equipment and Method: Stylet Placement Confirmation: ETT inserted through vocal cords under direct vision,  positive ETCO2 and breath sounds checked- equal and bilateral Secured at: 23 cm Tube secured with: Tape Dental Injury: Teeth and Oropharynx as per pre-operative assessment

## 2019-03-01 ENCOUNTER — Inpatient Hospital Stay (HOSPITAL_COMMUNITY): Payer: Medicaid Other

## 2019-03-01 ENCOUNTER — Encounter (HOSPITAL_COMMUNITY): Payer: Self-pay | Admitting: Vascular Surgery

## 2019-03-01 LAB — CBC
HCT: 28.8 % — ABNORMAL LOW (ref 39.0–52.0)
HCT: 20.7 % — ABNORMAL LOW (ref 39.0–52.0)
HEMOGLOBIN: 7 g/dL — AB (ref 13.0–17.0)
Hemoglobin: 10 g/dL — ABNORMAL LOW (ref 13.0–17.0)
MCH: 28.9 pg (ref 26.0–34.0)
MCH: 28.5 pg (ref 26.0–34.0)
MCHC: 34.7 g/dL (ref 30.0–36.0)
MCHC: 33.8 g/dL (ref 30.0–36.0)
MCV: 83.2 fL (ref 80.0–100.0)
MCV: 84.1 fL (ref 80.0–100.0)
Platelets: 205 10*3/uL (ref 150–400)
Platelets: 150 10*3/uL (ref 150–400)
RBC: 3.46 MIL/uL — ABNORMAL LOW (ref 4.22–5.81)
RBC: 2.46 MIL/uL — ABNORMAL LOW (ref 4.22–5.81)
RDW: 15.2 % (ref 11.5–15.5)
RDW: 15.6 % — ABNORMAL HIGH (ref 11.5–15.5)
WBC: 13.2 10*3/uL — ABNORMAL HIGH (ref 4.0–10.5)
WBC: 11.9 10*3/uL — ABNORMAL HIGH (ref 4.0–10.5)
nRBC: 0 % (ref 0.0–0.2)
nRBC: 0 % (ref 0.0–0.2)

## 2019-03-01 LAB — BASIC METABOLIC PANEL
Anion gap: 5 (ref 5–15)
BUN: 14 mg/dL (ref 6–20)
CO2: 23 mmol/L (ref 22–32)
Calcium: 6.5 mg/dL — ABNORMAL LOW (ref 8.9–10.3)
Chloride: 112 mmol/L — ABNORMAL HIGH (ref 98–111)
Creatinine, Ser: 2.22 mg/dL — ABNORMAL HIGH (ref 0.61–1.24)
GFR calc non Af Amer: 31 mL/min — ABNORMAL LOW (ref >60)
GFR, EST AFRICAN AMERICAN: 36 mL/min — AB (ref >60)
Glucose, Bld: 176 mg/dL — ABNORMAL HIGH (ref 70–99)
Potassium: 5 mmol/L (ref 3.5–5.1)
Sodium: 140 mmol/L (ref 135–145)

## 2019-03-01 LAB — PREPARE RBC (CROSSMATCH)

## 2019-03-01 LAB — LACTIC ACID, PLASMA: Lactic Acid, Venous: 3.6 mmol/L (ref 0.5–1.9)

## 2019-03-01 LAB — TRIGLYCERIDES: Triglycerides: 96 mg/dL (ref <150)

## 2019-03-01 LAB — HIV ANTIBODY (ROUTINE TESTING W REFLEX): HIV Screen 4th Generation wRfx: NONREACTIVE

## 2019-03-01 LAB — MRSA PCR SCREENING: MRSA by PCR: NEGATIVE

## 2019-03-01 LAB — PROTIME-INR
INR: 1.1 (ref 0.8–1.2)
Prothrombin Time: 14.3 seconds (ref 11.4–15.2)

## 2019-03-01 MED ORDER — SODIUM CHLORIDE 0.9% IV SOLUTION: Freq: Once | INTRAVENOUS | Status: DC

## 2019-03-01 MED ORDER — DOCUSATE SODIUM 50 MG/5ML PO LIQD
100.0000 mg | Freq: Two times a day (BID) | ORAL | Status: DC
  Administered 2019-03-02 – 2019-03-28 (×37): 100 mg
  Filled 2019-03-01 (×37): qty 10

## 2019-03-01 MED ORDER — PIPERACILLIN-TAZOBACTAM 3.375 G IVPB
3.3750 g | Freq: Three times a day (TID) | INTRAVENOUS | Status: DC
  Administered 2019-03-01 – 2019-03-05 (×12): 3.375 g via INTRAVENOUS
  Filled 2019-03-01 (×11): qty 50

## 2019-03-01 MED ORDER — SODIUM CHLORIDE 0.9 % IV SOLN
INTRAVENOUS | Status: DC | PRN
  Administered 2019-03-01 – 2019-03-05 (×3): 250 mL via INTRAVENOUS
  Administered 2019-03-22: 1000 mL via INTRAVENOUS
  Administered 2019-03-27: 08:00:00 via INTRAVENOUS

## 2019-03-01 MED ORDER — CALCIUM GLUCONATE-NACL 1-0.675 GM/50ML-% IV SOLN
1.0000 g | Freq: Once | INTRAVENOUS | Status: AC
  Administered 2019-03-01: 1000 mg via INTRAVENOUS
  Filled 2019-03-01: qty 50

## 2019-03-01 MED ORDER — PHENYLEPHRINE HCL-NACL 40-0.9 MG/250ML-% IV SOLN
0.0000 ug/min | INTRAVENOUS | Status: DC
  Administered 2019-03-01: 100 ug/min via INTRAVENOUS
  Administered 2019-03-02: 9 ug/min via INTRAVENOUS
  Filled 2019-03-01 (×4): qty 250

## 2019-03-01 MED ORDER — PANTOPRAZOLE SODIUM 40 MG IV SOLR
40.0000 mg | INTRAVENOUS | Status: DC
  Administered 2019-03-01 – 2019-03-08 (×7): 40 mg via INTRAVENOUS
  Filled 2019-03-01 (×7): qty 40

## 2019-03-01 MED ORDER — SODIUM CHLORIDE 0.9 % IV BOLUS
1000.0000 mL | Freq: Once | INTRAVENOUS | Status: AC
  Administered 2019-03-01: 1000 mL via INTRAVENOUS

## 2019-03-01 NOTE — Progress Notes (Signed)
  Progress Note    03/01/2019 2:44 PM 1 Day Post-Op  Subjective: Remains intubated and sedated  Vitals:   03/01/19 1300 03/01/19 1400  BP: 115/63 (!) 126/57  Pulse: 77 89  Resp: 18 18  Temp:    SpO2: 100% 100%    Physical Exam: Intubated sedated Right lower extremity knee immobilizer in place Wound VAC to suction medial and lateral right lower extremity There are signals right DP and PT  CBC    Component Value Date/Time   WBC 13.2 (H) 03/01/2019 0406   RBC 3.46 (L) 03/01/2019 0406   HGB 10.0 (L) 03/01/2019 0406   HCT 28.8 (L) 03/01/2019 0406   PLT 205 03/01/2019 0406   MCV 83.2 03/01/2019 0406   MCH 28.9 03/01/2019 0406   MCHC 34.7 03/01/2019 0406   RDW 15.2 03/01/2019 0406    BMET    Component Value Date/Time   NA 140 03/01/2019 0406   K 5.0 03/01/2019 0406   CL 112 (H) 03/01/2019 0406   CO2 23 03/01/2019 0406   GLUCOSE 176 (H) 03/01/2019 0406   BUN 14 03/01/2019 0406   CREATININE 2.22 (H) 03/01/2019 0406   CALCIUM 6.5 (L) 03/01/2019 0406   GFRNONAA 31 (L) 03/01/2019 0406   GFRAA 36 (L) 03/01/2019 0406    INR    Component Value Date/Time   INR 1.7 (H) 02/28/2019 1945     Intake/Output Summary (Last 24 hours) at 03/01/2019 1444 Last data filed at 03/01/2019 1115 Gross per 24 hour  Intake 15400.63 ml  Output 4285 ml  Net 11115.63 ml     Assessment:  58 y.o. male is s/p:   1.  Exploration of right below-knee popliteal artery and vein and ligation of popliteal artery and vein 2.  Harvest the right greater saphenous vein 3.  Bypass right above-knee to below-knee popliteal artery with non-reversed greater saphenous vein 4.  Right lower extremity 4 compartment fasciotomy with negative pressure dressing placement  Plan: I discussed with the family as well as Ortho and trauma trauma services that he has a significant injury to his right lower extremity.  Currently his bypass is patent will need to be on aspirin.  Continue knee immobilization and wound  VAC.  He is in the OR tomorrow I will attempt to stop by.   Alwyn Cordner C. Randie Heinz, MD [facility] Office: [phone] Pager: [phone]  03/01/2019 2:44 PM

## 2019-03-01 NOTE — Progress Notes (Signed)
CRITICAL VALUE ALERT  Critical Value:  Lactic acid 3.6  Date & Time Notied:  03/01/2019, 0450  Provider Notified: Kinsinger  Orders Received/Actions taken: See [facility]

## 2019-03-01 NOTE — Progress Notes (Signed)
Orthopedic Tech Progress Note Patient Details:  Travis Benson 12-26-1961 [id number]  Patient ID: [name], male   DOB: 04-22-1961, 58 y.o.   MRN: [id number]   [name] for TLSO brace. 03/01/2019, 8:34 AM

## 2019-03-01 NOTE — Progress Notes (Signed)
OT Cancellation Note  Patient Details Name: Travis Benson MRN: [id number] DOB: Mar 20, 1961   Cancelled Treatment:    Reason Eval/Treat Not Completed: Patient not medically ready.Pt remains in L LE traction with lactic acid at 3.6. Plan is for OR tues 3/3 for ORIF to L femur and tibia. Acute OT to return as appropriate to complete OT eval.  Ignacia Palma, OTR/L Acute Rehab Services Pager [phone] Office [phone]     Evette Georges 03/01/2019, 11:44 AM
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
## 2019-03-01 NOTE — Consult Note (Signed)
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  There is definitely some concern that the anterior soft tissue which is degloved off of his anterior tibia may not survive may ultimately become necrotic.  I think an in-depth discussion will need to be in terms of amputation needs to be had.  An above-knee amputation.  If limb salvage was desired he would like extensive knee reconstruction.  We could consider placement of a spanning external fixator on the right leg however again the concern remains about the severely traumatized soft tissue to his right lower leg.  Placement of external fixator pins through this tissue could further compromise its integrity.  We may alternatively just wait and allow the soft tissue to declare itself and then proceed accordingly.  Really will not have a good understanding of the amount of soft tissue injury present until we can evaluate in the OR tomorrow.  Would anticipate VAC change in the OR tomorrow as well.  Does sound that given the magnitude of the soft tissue injury that closure of his fasciotomies would be virtually impossible to do as well and would require some type of grafting procedure for limb salvage is soft.  This does place him at increased risk for infection as well.  This could also place a significant physiologic strain on the patient as he is recovering as well.      Will monitor CKs as well as kidney function to evaluate for ongoing muscle injury which may influence the decision to proceed with amputation  -Thoracic and lumbar spine fractures  Per neurosurgery  - Pain management:  Continue per trauma service  - ABL anemia/Hemodynamics  Monitor CBC closely             Blood products as needed  - Medical issues   Per Trauma team   - DVT/PE prophylaxis:  Unable to place SCDs given his bilateral lower extremity injuries.  Would not use foot pumps either.  ?  Candidate for IVC filter  Start pharmacologic's once he is more stable and his CBC stabilizes  - ID:   Zosyn for open fracture treatment.  Electing the Zosyn over Ancef given suspected level of contamination along with his vascular injury to his right leg with open fasciotomies  - Activity:  Patient will be essentially bed to chair nonweightbearing bilaterally for 8 weeks at least barring any complications  - FEN/GI prophylaxis/Foley/Lines:  Npo  Fluids per trauma team  - Impediments to fracture healing:  Open fracture  Significant physiologic strain from magnitude of injury  - Dispo:  Hopeful for OR tomorrow for IM nail tibia and femur, irrigation debridement tibia and VAC change right leg     Mearl Latin, PA-C [phone] (C) 03/01/2019, 10:17 AM  [facility] [address] [place] [address] [phone] Collier Bullock (F)
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
## 2019-03-01 NOTE — Progress Notes (Signed)
PT Cancellation Note  Patient Details Name: Travis Benson MRN: [id number] DOB: [birth year]   Cancelled Treatment:    Reason Eval/Treat Not Completed: Patient not medically ready. Pt remains in L LE traction with lactic acid at 3.6. Plan is for OR tues 3/3 for ORIF to L femur and tibia. Acute PT to return as able, as appropriate to complete PT eval.  Lewis Shock, PT, DPT Acute Rehabilitation Services Pager #: [phone] Office #: [phone]    Iona Hansen 03/01/2019, 8:05 AM
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
## 2019-03-02 ENCOUNTER — Inpatient Hospital Stay (HOSPITAL_COMMUNITY): Payer: Medicaid Other | Admitting: Certified Registered"

## 2019-03-02 ENCOUNTER — Encounter (HOSPITAL_COMMUNITY): Payer: Self-pay | Admitting: Orthopedic Surgery

## 2019-03-02 ENCOUNTER — Inpatient Hospital Stay (HOSPITAL_COMMUNITY): Payer: Medicaid Other

## 2019-03-02 ENCOUNTER — Encounter (HOSPITAL_COMMUNITY): Admission: EM | Disposition: A | Discharge status: Skilled Nursing Facility | Payer: Self-pay | Source: Home / Self Care

## 2019-03-02 DIAGNOSIS — S32810A Multiple fractures of pelvis with stable disruption of pelvic ring, initial encounter for closed fracture: Secondary | ICD-10-CM

## 2019-03-02 DIAGNOSIS — S42102A Fracture of unspecified part of scapula, left shoulder, initial encounter for closed fracture: Secondary | ICD-10-CM

## 2019-03-02 DIAGNOSIS — S85001A Unspecified injury of popliteal artery, right leg, initial encounter: Secondary | ICD-10-CM

## 2019-03-02 DIAGNOSIS — S8491XA Injury of unspecified nerve at lower leg level, right leg, initial encounter: Secondary | ICD-10-CM

## 2019-03-02 DIAGNOSIS — S85501A Unspecified injury of popliteal vein, right leg, initial encounter: Secondary | ICD-10-CM | POA: Diagnosis present

## 2019-03-02 DIAGNOSIS — S83104A Unspecified dislocation of right knee, initial encounter: Secondary | ICD-10-CM

## 2019-03-02 DIAGNOSIS — S82202B Unspecified fracture of shaft of left tibia, initial encounter for open fracture type I or II: Secondary | ICD-10-CM | POA: Diagnosis present

## 2019-03-02 HISTORY — PX: I & D EXTREMITY: SHX5045

## 2019-03-02 HISTORY — PX: EXTERNAL FIXATION REMOVAL: SHX5040

## 2019-03-02 HISTORY — PX: EXTERNAL FIXATION LEG: SHX1549

## 2019-03-02 HISTORY — DX: Multiple fractures of pelvis with stable disruption of pelvic ring, initial encounter for closed fracture: S32.810A

## 2019-03-02 HISTORY — PX: FEMUR IM NAIL: SHX1597

## 2019-03-02 HISTORY — DX: Unspecified dislocation of right knee, initial encounter: S83.104A

## 2019-03-02 HISTORY — DX: Unspecified injury of popliteal artery, right leg, initial encounter: S85.001A

## 2019-03-02 HISTORY — DX: Unspecified fracture of shaft of left tibia, initial encounter for open fracture type I or II: S82.202B

## 2019-03-02 HISTORY — PX: ORIF PELVIC FRACTURE WITH PERCUTANEOUS SCREWS: SHX6800

## 2019-03-02 HISTORY — PX: TIBIA IM NAIL INSERTION: SHX2516

## 2019-03-02 HISTORY — DX: Fracture of unspecified part of scapula, left shoulder, initial encounter for closed fracture: S42.102A

## 2019-03-02 HISTORY — PX: APPLICATION OF WOUND VAC: SHX5189

## 2019-03-02 HISTORY — DX: Injury of unspecified nerve at lower leg level, right leg, initial encounter: S84.91XA

## 2019-03-02 LAB — PREPARE FRESH FROZEN PLASMA
UNIT DIVISION: 0
Unit division: 0
Unit division: 0
Unit division: 0
Unit division: 0
Unit division: 0
Unit division: 0

## 2019-03-02 LAB — BPAM FFP
Blood Product Expiration Date: 202003032359
Blood Product Expiration Date: 202003032359
Blood Product Expiration Date: 202003052359
Blood Product Expiration Date: 202003052359
Blood Product Expiration Date: 202003062359
Blood Product Expiration Date: 202003062359
Blood Product Expiration Date: 202003062359
Blood Product Expiration Date: 202003062359
ISSUE DATE / TIME: 202003011859
ISSUE DATE / TIME: 202003011859
ISSUE DATE / TIME: 202003012027
ISSUE DATE / TIME: 202003012027
ISSUE DATE / TIME: 202003020853
ISSUE DATE / TIME: 202003021109
UNIT TYPE AND RH: 5100
Unit Type and Rh: 5100
Unit Type and Rh: 5100
Unit Type and Rh: 5100
Unit Type and Rh: 5100
Unit Type and Rh: 5100
Unit Type and Rh: 6200
Unit Type and Rh: 6200

## 2019-03-02 LAB — BASIC METABOLIC PANEL
ANION GAP: 3 — AB (ref 5–15)
BUN: 18 mg/dL (ref 6–20)
CO2: 23 mmol/L (ref 22–32)
Calcium: 6.6 mg/dL — ABNORMAL LOW (ref 8.9–10.3)
Chloride: 115 mmol/L — ABNORMAL HIGH (ref 98–111)
Creatinine, Ser: 1.82 mg/dL — ABNORMAL HIGH (ref 0.61–1.24)
GFR calc Af Amer: 46 mL/min — ABNORMAL LOW (ref >60)
GFR calc non Af Amer: 40 mL/min — ABNORMAL LOW (ref >60)
Glucose, Bld: 129 mg/dL — ABNORMAL HIGH (ref 70–99)
POTASSIUM: 4.3 mmol/L (ref 3.5–5.1)
Sodium: 141 mmol/L (ref 135–145)

## 2019-03-02 LAB — POCT I-STAT 7, (LYTES, BLD GAS, ICA,H+H)
ACID-BASE DEFICIT: 4 mmol/L — AB (ref 0.0–2.0)
Acid-base deficit: 4 mmol/L — ABNORMAL HIGH (ref 0.0–2.0)
Acid-base deficit: 3 mmol/L — ABNORMAL HIGH (ref 0.0–2.0)
Bicarbonate: 20.8 mmol/L (ref 20.0–28.0)
Bicarbonate: 22.7 mmol/L (ref 20.0–28.0)
Bicarbonate: 21.9 mmol/L (ref 20.0–28.0)
Calcium, Ion: 0.96 mmol/L — ABNORMAL LOW (ref 1.15–1.40)
Calcium, Ion: 0.94 mmol/L — ABNORMAL LOW (ref 1.15–1.40)
Calcium, Ion: 0.95 mmol/L — ABNORMAL LOW (ref 1.15–1.40)
HCT: 22 % — ABNORMAL LOW (ref 39.0–52.0)
HCT: 24 % — ABNORMAL LOW (ref 39.0–52.0)
HCT: 24 % — ABNORMAL LOW (ref 39.0–52.0)
HEMOGLOBIN: 7.5 g/dL — AB (ref 13.0–17.0)
HEMOGLOBIN: 8.2 g/dL — AB (ref 13.0–17.0)
Hemoglobin: 8.2 g/dL — ABNORMAL LOW (ref 13.0–17.0)
O2 SAT: 99 %
O2 Saturation: 99 %
O2 Saturation: 99 %
PO2 ART: 170 mmHg — AB (ref 83.0–108.0)
Patient temperature: 98.9
Patient temperature: 36.1
Potassium: 4.4 mmol/L (ref 3.5–5.1)
Potassium: 4.3 mmol/L (ref 3.5–5.1)
Potassium: 4.3 mmol/L (ref 3.5–5.1)
SODIUM: 141 mmol/L (ref 135–145)
Sodium: 144 mmol/L (ref 135–145)
Sodium: 144 mmol/L (ref 135–145)
TCO2: 22 mmol/L (ref 22–32)
TCO2: 24 mmol/L (ref 22–32)
TCO2: 23 mmol/L (ref 22–32)
pCO2 arterial: 35.4 mmHg (ref 32.0–48.0)
pCO2 arterial: 42.5 mmHg (ref 32.0–48.0)
pCO2 arterial: 41.9 mmHg (ref 32.0–48.0)
pH, Arterial: 7.379 (ref 7.350–7.450)
pH, Arterial: 7.332 — ABNORMAL LOW (ref 7.350–7.450)
pH, Arterial: 7.327 — ABNORMAL LOW (ref 7.350–7.450)
pO2, Arterial: 125 mmHg — ABNORMAL HIGH (ref 83.0–108.0)
pO2, Arterial: 156 mmHg — ABNORMAL HIGH (ref 83.0–108.0)

## 2019-03-02 LAB — PREPARE RBC (CROSSMATCH)

## 2019-03-02 LAB — CBC
HCT: 26.8 % — ABNORMAL LOW (ref 39.0–52.0)
HCT: 26.4 % — ABNORMAL LOW (ref 39.0–52.0)
Hemoglobin: 9.2 g/dL — ABNORMAL LOW (ref 13.0–17.0)
Hemoglobin: 8.8 g/dL — ABNORMAL LOW (ref 13.0–17.0)
MCH: 28.8 pg (ref 26.0–34.0)
MCH: 28.9 pg (ref 26.0–34.0)
MCHC: 34.3 g/dL (ref 30.0–36.0)
MCHC: 33.3 g/dL (ref 30.0–36.0)
MCV: 84 fL (ref 80.0–100.0)
MCV: 86.8 fL (ref 80.0–100.0)
NRBC: 0 % (ref 0.0–0.2)
NRBC: 0 % (ref 0.0–0.2)
Platelets: 137 10*3/uL — ABNORMAL LOW (ref 150–400)
Platelets: 129 10*3/uL — ABNORMAL LOW (ref 150–400)
RBC: 3.19 MIL/uL — ABNORMAL LOW (ref 4.22–5.81)
RBC: 3.04 MIL/uL — ABNORMAL LOW (ref 4.22–5.81)
RDW: 15.3 % (ref 11.5–15.5)
RDW: 15.8 % — ABNORMAL HIGH (ref 11.5–15.5)
WBC: 10.4 10*3/uL (ref 4.0–10.5)
WBC: 6.4 10*3/uL (ref 4.0–10.5)

## 2019-03-02 LAB — BLOOD PRODUCT ORDER (VERBAL) VERIFICATION

## 2019-03-02 LAB — LACTIC ACID, PLASMA: Lactic Acid, Venous: 1.7 mmol/L (ref 0.5–1.9)

## 2019-03-02 LAB — CK
Total CK: 8722 U/L — ABNORMAL HIGH (ref 49–397)
Total CK: 9708 U/L — ABNORMAL HIGH (ref 49–397)

## 2019-03-02 SURGERY — INSERTION, INTRAMEDULLARY ROD, FEMUR, RETROGRADE
Anesthesia: General | Site: Pelvis | Laterality: Right

## 2019-03-02 MED ORDER — LACTATED RINGERS IV SOLN
INTRAVENOUS | Status: DC | PRN
  Administered 2019-03-02 (×2): via INTRAVENOUS

## 2019-03-02 MED ORDER — ONDANSETRON HCL 4 MG/2ML IJ SOLN
INTRAMUSCULAR | Status: AC
  Filled 2019-03-02: qty 2

## 2019-03-02 MED ORDER — CALCIUM GLUCONATE-NACL 1-0.675 GM/50ML-% IV SOLN
1.0000 g | Freq: Once | INTRAVENOUS | Status: AC
  Administered 2019-03-02: 1000 mg via INTRAVENOUS
  Filled 2019-03-02: qty 50

## 2019-03-02 MED ORDER — SODIUM CHLORIDE 0.9 % IR SOLN
Status: DC | PRN
  Administered 2019-03-02 (×2): 3000 mL

## 2019-03-02 MED ORDER — ALBUMIN HUMAN 5 % IV SOLN
INTRAVENOUS | Status: DC | PRN
  Administered 2019-03-02 (×2): via INTRAVENOUS

## 2019-03-02 MED ORDER — SODIUM CHLORIDE 0.9 % IV SOLN: 10.0000 mL/h | Freq: Once | INTRAVENOUS | Status: DC

## 2019-03-02 MED ORDER — FENTANYL CITRATE (PF) 250 MCG/5ML IJ SOLN
INTRAMUSCULAR | Status: AC
  Filled 2019-03-02: qty 5

## 2019-03-02 MED ORDER — PHENYLEPHRINE 40 MCG/ML (10ML) SYRINGE FOR IV PUSH (FOR BLOOD PRESSURE SUPPORT)
PREFILLED_SYRINGE | INTRAVENOUS | Status: DC | PRN
  Administered 2019-03-02: 80 ug via INTRAVENOUS

## 2019-03-02 MED ORDER — ROCURONIUM BROMIDE 10 MG/ML (PF) SYRINGE
PREFILLED_SYRINGE | INTRAVENOUS | Status: DC | PRN
  Administered 2019-03-02 (×4): 50 mg via INTRAVENOUS
  Administered 2019-03-02: 30 mg via INTRAVENOUS
  Administered 2019-03-02: 20 mg via INTRAVENOUS

## 2019-03-02 MED ORDER — IPRATROPIUM-ALBUTEROL 0.5-2.5 (3) MG/3ML IN SOLN
3.0000 mL | Freq: Four times a day (QID) | RESPIRATORY_TRACT | Status: DC
  Administered 2019-03-02 – 2019-03-03 (×3): 3 mL via RESPIRATORY_TRACT
  Filled 2019-03-02 (×3): qty 3

## 2019-03-02 MED ORDER — 0.9 % SODIUM CHLORIDE (POUR BTL) OPTIME
TOPICAL | Status: DC | PRN
  Administered 2019-03-02 (×2): 1000 mL

## 2019-03-02 MED ORDER — SODIUM CHLORIDE 0.9 % IV SOLN
INTRAVENOUS | Status: DC | PRN
  Administered 2019-03-02: 16:00:00 via INTRAVENOUS

## 2019-03-02 MED ORDER — DEXAMETHASONE SODIUM PHOSPHATE 10 MG/ML IJ SOLN
INTRAMUSCULAR | Status: DC | PRN
  Administered 2019-03-02: 4 mg via INTRAVENOUS

## 2019-03-02 MED ORDER — FENTANYL CITRATE (PF) 100 MCG/2ML IJ SOLN
INTRAMUSCULAR | Status: DC | PRN
  Administered 2019-03-02 (×3): 50 ug via INTRAVENOUS
  Administered 2019-03-02: 150 ug via INTRAVENOUS
  Administered 2019-03-02: 100 ug via INTRAVENOUS
  Administered 2019-03-02 (×2): 50 ug via INTRAVENOUS

## 2019-03-02 MED ORDER — SODIUM CHLORIDE 0.9 % IV SOLN
INTRAVENOUS | Status: DC | PRN
  Administered 2019-03-02: 50 ug/min via INTRAVENOUS

## 2019-03-02 MED ORDER — ONDANSETRON HCL 4 MG/2ML IJ SOLN
INTRAMUSCULAR | Status: DC | PRN
  Administered 2019-03-02: 4 mg via INTRAVENOUS

## 2019-03-02 MED ORDER — SODIUM CHLORIDE 0.9% IV SOLUTION: Freq: Once | INTRAVENOUS | Status: DC

## 2019-03-02 SURGICAL SUPPLY — 114 items
BANDAGE ACE 4X5 VEL STRL LF (GAUZE/BANDAGES/DRESSINGS) ×14 IMPLANT
BANDAGE ACE 6X5 VEL STRL LF (GAUZE/BANDAGES/DRESSINGS) ×12 IMPLANT
BANDAGE ESMARK 6X9 LF (GAUZE/BANDAGES/DRESSINGS) IMPLANT
BAR GLASS FIBER EXFX 11X500 (EXFIX) ×4 IMPLANT
BIT DRILL 3.8X6 NS (BIT) ×2 IMPLANT
BIT DRILL 4.4 NS (BIT) ×2 IMPLANT
BIT DRILL CALIBRATED 4.3MMX365 (DRILL) IMPLANT
BIT DRILL CROWE PNT TWST 4.5MM (DRILL) IMPLANT
BLADE SURG 10 STRL SS (BLADE) ×8 IMPLANT
BNDG COHESIVE 4X5 TAN STRL (GAUZE/BANDAGES/DRESSINGS) ×8 IMPLANT
BNDG ESMARK 6X9 LF (GAUZE/BANDAGES/DRESSINGS)
BNDG GAUZE ELAST 4 BULKY (GAUZE/BANDAGES/DRESSINGS) ×16 IMPLANT
BNDG GAUZE STRTCH 6 (GAUZE/BANDAGES/DRESSINGS) ×18 IMPLANT
BRUSH SCRUB SURG 4.25 DISP (MISCELLANEOUS) ×16 IMPLANT
CANISTER WOUND CARE 500ML ATS (WOUND CARE) ×4 IMPLANT
COVER MAYO STAND STRL (DRAPES) ×6 IMPLANT
COVER SURGICAL LIGHT HANDLE (MISCELLANEOUS) ×16 IMPLANT
COVER WAND RF STERILE (DRAPES) ×6 IMPLANT
DRAPE C-ARM 42X72 X-RAY (DRAPES) ×12 IMPLANT
DRAPE C-ARMOR (DRAPES) ×12 IMPLANT
DRAPE HALF SHEET 40X57 (DRAPES) IMPLANT
DRAPE IMP U-DRAPE 54X76 (DRAPES) ×6 IMPLANT
DRAPE INCISE IOBAN 66X45 STRL (DRAPES) ×10 IMPLANT
DRAPE LAPAROTOMY TRNSV 102X78 (DRAPE) ×2 IMPLANT
DRAPE ORTHO SPLIT 77X108 STRL (DRAPES) ×8
DRAPE SURG ORHT 6 SPLT 77X108 (DRAPES) ×12 IMPLANT
DRAPE U-SHAPE 47X51 STRL (DRAPES) ×6 IMPLANT
DRESSING PREVENA PLUS CUSTOM (GAUZE/BANDAGES/DRESSINGS) IMPLANT
DRILL BIT CANN (BIT) ×2 IMPLANT
DRILL CALIBRATED 4.3MMX365 (DRILL) ×8
DRILL CROWE POINT TWIST 4.5MM (DRILL) ×8
DRSG ADAPTIC 3X8 NADH LF (GAUZE/BANDAGES/DRESSINGS) ×8 IMPLANT
DRSG MEPILEX BORDER 4X4 (GAUZE/BANDAGES/DRESSINGS) ×6 IMPLANT
DRSG MEPILEX BORDER 4X8 (GAUZE/BANDAGES/DRESSINGS) ×4 IMPLANT
DRSG MEPITEL 4X7.2 (GAUZE/BANDAGES/DRESSINGS) ×6 IMPLANT
DRSG PAD ABDOMINAL 8X10 ST (GAUZE/BANDAGES/DRESSINGS) ×2 IMPLANT
DRSG PREVENA PLUS CUSTOM (GAUZE/BANDAGES/DRESSINGS) ×8
DRSG VAC ATS LRG SENSATRAC (GAUZE/BANDAGES/DRESSINGS) ×4 IMPLANT
ELECT REM PT RETURN 9FT ADLT (ELECTROSURGICAL) ×8
ELECTRODE REM PT RTRN 9FT ADLT (ELECTROSURGICAL) ×6 IMPLANT
EVACUATOR 1/8 PVC DRAIN (DRAIN) IMPLANT
GAUZE SPONGE 4X4 12PLY STRL (GAUZE/BANDAGES/DRESSINGS) ×8 IMPLANT
GAUZE XEROFORM 1X8 LF (GAUZE/BANDAGES/DRESSINGS) ×6 IMPLANT
GLOVE BIO SURGEON STRL SZ7.5 (GLOVE) ×22 IMPLANT
GLOVE BIO SURGEON STRL SZ8 (GLOVE) ×18 IMPLANT
GLOVE BIO SURGEON STRL SZ8.5 (GLOVE) ×6 IMPLANT
GLOVE BIOGEL PI IND STRL 7.5 (GLOVE) ×6 IMPLANT
GLOVE BIOGEL PI IND STRL 8 (GLOVE) ×6 IMPLANT
GLOVE BIOGEL PI INDICATOR 7.5 (GLOVE) ×16
GLOVE BIOGEL PI INDICATOR 8 (GLOVE) ×6
GLOVE XGUARD RR 2 7.5 (GLOVE) IMPLANT
GLOVE XGUARD RR2 7.0 (GLOVE) IMPLANT
GLOVE XGUARD RR2 7.5 (GLOVE) ×2
GOWN STRL REUS W/ TWL LRG LVL3 (GOWN DISPOSABLE) ×12 IMPLANT
GOWN STRL REUS W/ TWL XL LVL3 (GOWN DISPOSABLE) ×6 IMPLANT
GOWN STRL REUS W/TWL LRG LVL3 (GOWN DISPOSABLE) ×8
GOWN STRL REUS W/TWL XL LVL3 (GOWN DISPOSABLE) ×6
GUIDEPIN 2.8X300 THRD TIP OIC (PIN) ×2 IMPLANT
GUIDEPIN 3.2X17.5 THRD DISP (PIN) ×2 IMPLANT
GUIDEPIN VERSANAIL DSP 3.2X444 (ORTHOPEDIC DISPOSABLE SUPPLIES) ×2 IMPLANT
GUIDEWIRE BALL NOSE 80CM (WIRE) ×2 IMPLANT
GUIDEWIRE BEAD TIP (WIRE) ×2 IMPLANT
HALF PIN 5.0X160 (EXFIX) ×4 IMPLANT
HANDPIECE INTERPULSE COAX TIP (DISPOSABLE) ×2
KIT BASIN OR (CUSTOM PROCEDURE TRAY) ×8 IMPLANT
KIT INFUSE LRG II (Orthopedic Implant) ×2 IMPLANT
KIT TURNOVER KIT B (KITS) ×8 IMPLANT
MANIFOLD NEPTUNE II (INSTRUMENTS) ×8 IMPLANT
NAIL FEM RETRO 9X400 (Nail) ×2 IMPLANT
NAIL TIBIAL 8X37.5 (Nail) ×2 IMPLANT
NS IRRIG 1000ML POUR BTL (IV SOLUTION) ×8 IMPLANT
PACK ORTHO EXTREMITY (CUSTOM PROCEDURE TRAY) ×10 IMPLANT
PACK UNIVERSAL I (CUSTOM PROCEDURE TRAY) ×8 IMPLANT
PAD ABD 8X10 STRL (GAUZE/BANDAGES/DRESSINGS) ×14 IMPLANT
PAD ARMBOARD 7.5X6 YLW CONV (MISCELLANEOUS) ×16 IMPLANT
PAD CAST 4YDX4 CTTN HI CHSV (CAST SUPPLIES) IMPLANT
PADDING CAST COTTON 4X4 STRL (CAST SUPPLIES) ×4
PADDING CAST COTTON 6X4 STRL (CAST SUPPLIES) ×22 IMPLANT
PENCIL BUTTON HOLSTER BLD 10FT (ELECTRODE) ×2 IMPLANT
PIN CLAMP 2BAR 75MM BLUE (EXFIX) ×4 IMPLANT
PIN HALF 5.0X200MM (EXFIX) ×4 IMPLANT
SCREW ACECAP 44MM (Screw) ×2 IMPLANT
SCREW ACECAP 50MM (Screw) ×2 IMPLANT
SCREW CANN 7.3X70 32MM THRD (Screw) ×2 IMPLANT
SCREW CORT TI DBL LEAD 5X32 (Screw) ×4 IMPLANT
SCREW CORT TI DBL LEAD 5X58 (Screw) ×2 IMPLANT
SCREW CORT TI DBL LEAD 5X75 (Screw) ×2 IMPLANT
SCREW PROXIMAL 4.5MMX18MM (Screw) ×4 IMPLANT
SET HNDPC FAN SPRY TIP SCT (DISPOSABLE) IMPLANT
SPONGE LAP 18X18 RF (DISPOSABLE) ×20 IMPLANT
STAPLER VISISTAT 35W (STAPLE) ×8 IMPLANT
STOCKINETTE IMPERVIOUS 9X36 MD (GAUZE/BANDAGES/DRESSINGS) ×6 IMPLANT
STOCKINETTE IMPERVIOUS LG (DRAPES) ×8 IMPLANT
SUT ETHILON 1 LR 30 (SUTURE) ×4 IMPLANT
SUT ETHILON 1 TP 1 60 (SUTURE) ×2 IMPLANT
SUT ETHILON 2 0 FS 18 (SUTURE) ×6 IMPLANT
SUT ETHILON 2 0 PSLX (SUTURE) ×4 IMPLANT
SUT ETHILON 3 0 PS 1 (SUTURE) ×12 IMPLANT
SUT PDS AB 2-0 CT1 27 (SUTURE) ×2 IMPLANT
SUT PROLENE 3 0 PS 2 (SUTURE) IMPLANT
SUT VIC AB 0 CT1 27 (SUTURE) ×4
SUT VIC AB 0 CT1 27XBRD ANBCTR (SUTURE) IMPLANT
SUT VIC AB 2-0 CT1 27 (SUTURE) ×4
SUT VIC AB 2-0 CT1 TAPERPNT 27 (SUTURE) ×6 IMPLANT
SUT VIC AB 2-0 CT3 27 (SUTURE) IMPLANT
SWAB CULTURE ESWAB REG 1ML (MISCELLANEOUS) IMPLANT
TOWEL OR 17X24 6PK STRL BLUE (TOWEL DISPOSABLE) ×16 IMPLANT
TOWEL OR 17X26 10 PK STRL BLUE (TOWEL DISPOSABLE) ×20 IMPLANT
TUBE CONNECTING 12'X1/4 (SUCTIONS)
TUBE CONNECTING 12X1/4 (SUCTIONS) ×6 IMPLANT
UNDERPAD 30X30 (UNDERPADS AND DIAPERS) ×6 IMPLANT
WASHER OIC 13MM 6 PACK (Screw) ×2 IMPLANT
WATER STERILE IRR 1000ML POUR (IV SOLUTION) ×14 IMPLANT
YANKAUER SUCT BULB TIP NO VENT (SUCTIONS) ×6 IMPLANT
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
## 2019-03-02 NOTE — Progress Notes (Signed)
Patient returned from OR with manual ventilation by CRNA.  Patient placed back on ventilator with previous settings.

## 2019-03-02 NOTE — Transfer of Care (Signed)
Immediate Anesthesia Transfer of Care Note  Patient: Travis Benson  Procedure(s) Performed: INTRAMEDULLARY (IM) RETROGRADE FEMORAL NAILING (Left Leg Upper) IRRIGATION AND DEBRIDEMENT LEG (Left Leg Lower) REMOVAL EXTERNAL FIXATION LEG (Left Knee) INTRAMEDULLARY (IM) NAIL TIBIAL (Left Leg Lower) Orif Pelvic Fracture With Percutaneous Screws (Right Pelvis) Application Of Wound Vac (Bilateral Leg Lower) External Fixation Leg (Right Leg Lower) Wound Vac dressing removal (Right Leg Lower)  Patient Location: NICU  Anesthesia Type:General  Level of Consciousness: Patient remains intubated per anesthesia plan  Airway & Oxygen Therapy: Patient remains intubated per anesthesia plan and Patient placed on Ventilator (see vital sign flow sheet for setting)  Post-op Assessment: Report given to RN and Post -op Vital signs reviewed and stable  Post vital signs: Reviewed and stable  Last Vitals:  Vitals Value Taken Time  BP 114/59 03/02/2019  7:45 PM  Temp    Pulse 100 03/02/2019  7:47 PM  Resp 18 03/02/2019  7:47 PM  SpO2 99 % 03/02/2019  7:47 PM  Vitals shown include unvalidated device data.  Last Pain:  Vitals:   03/02/19 1200  TempSrc: Axillary  PainSc:          Complications: No apparent anesthesia complications

## 2019-03-02 NOTE — Progress Notes (Signed)
OT Cancellation Note  Patient Details Name: Albion Depietro MRN: [id number] DOB: Jan 25, 1961   Cancelled Treatment:    Reason Eval/Treat Not Completed: Patient not medically ready.  Pt for surgery today per notes.  Will check back.  Jeani Hawking, OTR/L Acute Rehabilitation Services Pager [phone] Office [phone]   Jeani Hawking M 03/02/2019, 6:33 AM
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
## 2019-03-02 NOTE — Brief Op Note (Signed)
02/28/2019 - 03/02/2019  7:27 PM  PATIENT:  Travis Benson  58 y.o. male  PRE-OPERATIVE DIAGNOSIS:  left femur fracture, open left tibia fracture, right pelvic ring disruption, right knee dislocation, vascular injury right leg status post repair, open fasciotomy right leg.  POST-OPERATIVE DIAGNOSIS:  left femur fracture, open left tibia fracture, right pelvic ring disruption, right knee dislocation, vascular injury right leg status post repair, open fasciotomy right leg.  PROCEDURE:  Procedure(s): 1. INTRAMEDULLARY (IM) RETROGRADE FEMORAL NAILING (Left) w [place] 9 x 400 statically locked 2. IRRIGATION AND DEBRIDEMENT LEG (Left) grade 3 open tibia including bone 3. REMOVAL traction pin left femur (Left) 4. INTRAMEDULLARY (IM) NAIL TIBIAL (Left) with Versanail 8 x statically locked 5. Removal of implant left tibia 6. Sacroiliac screw fixation of pelvis (Right) 6. Application Of large Wound Vac left 7. Wound Vac dressing change under anesthesia (Right) 8. External Fixation Leg (Right) right knee dislocation 9. Treatment of right knee dislocation with manipulation 10. Stress fluoroscopy   SURGEON:  Surgeon(s) and Role: Panel 1:    Myrene Galas, MD - Primary Panel 2:    * Maeola Harman, MD - Primary  PHYSICIAN ASSISTANT: 1. KEITH PAUL, PA-C, 2. PA Student  ANESTHESIA:   general  EBL:  200 mL   BLOOD ADMINISTERED:see anesthesia record  DRAINS: multiple wound vacs   LOCAL MEDICATIONS USED:  NONE  SPECIMEN:  Left tibia bone open fracture  DISPOSITION OF SPECIMEN:  micro  COUNTS:  YES  TOURNIQUET:  * Missing tourniquet times found for documented tourniquets in log: [id number] *  DICTATION: .Other Dictation: Dictation Number [id number]  PLAN OF CARE: Admit to inpatient   PATIENT DISPOSITION:  PACU - hemodynamically stable.   Delay start of Pharmacological VTE agent (>24hrs) due to surgical blood loss or risk of bleeding: no

## 2019-03-02 NOTE — Progress Notes (Signed)
  Progress Note    03/02/2019 4:53 PM Day of Surgery  Subjective: Intubated and sedated  Vitals:   03/02/19 1215 03/02/19 1300  BP:  (!) 107/57  Pulse: 80 77  Resp: 19 18  Temp:    SpO2: 100% 100%    Physical Exam: Remains intubated and sedated Wound VAC to suction right lower extremity Palpable dorsalis pedis pulse on the right  CBC    Component Value Date/Time   WBC 10.4 03/02/2019 0429   RBC 3.19 (L) 03/02/2019 0429   HGB 9.2 (L) 03/02/2019 0429   HCT 26.8 (L) 03/02/2019 0429   PLT 137 (L) 03/02/2019 0429   MCV 84.0 03/02/2019 0429   MCH 28.8 03/02/2019 0429   MCHC 34.3 03/02/2019 0429   RDW 15.3 03/02/2019 0429    BMET    Component Value Date/Time   NA 141 03/02/2019 0429   K 4.3 03/02/2019 0429   CL 115 (H) 03/02/2019 0429   CO2 23 03/02/2019 0429   GLUCOSE 129 (H) 03/02/2019 0429   BUN 18 03/02/2019 0429   CREATININE 1.82 (H) 03/02/2019 0429   CALCIUM 6.6 (L) 03/02/2019 0429   GFRNONAA 40 (L) 03/02/2019 0429   GFRAA 46 (L) 03/02/2019 0429    INR    Component Value Date/Time   INR 1.1 03/01/2019 1450     Intake/Output Summary (Last 24 hours) at 03/02/2019 1653 Last data filed at 03/02/2019 1645 Gross per 24 hour  Intake 6032.56 ml  Output 5505 ml  Net 527.56 ml     Assessment:  59 y.o. male is s/p:   1.Exploration of right below-knee popliteal artery and vein and ligation of popliteal artery and vein 2.Harvest the right greater saphenous vein 3.Bypass right above-knee to below-knee popliteal artery with non-reversed greater saphenous vein 4.Right lower extremity 4 compartment fasciotomy with negative pressure dressing placement  Plan: OR today with orthopedic trauma and I will try to be present to evaluate wounds.  Brandon C. Randie Heinz, MD [facility] Office: [phone] Pager: [phone]  03/02/2019 4:53 PM

## 2019-03-03 ENCOUNTER — Inpatient Hospital Stay (HOSPITAL_COMMUNITY): Payer: Medicaid Other

## 2019-03-03 ENCOUNTER — Encounter (HOSPITAL_COMMUNITY): Payer: Self-pay | Admitting: Orthopedic Surgery

## 2019-03-03 LAB — CBC
HCT: 25.4 % — ABNORMAL LOW (ref 39.0–52.0)
Hemoglobin: 8.6 g/dL — ABNORMAL LOW (ref 13.0–17.0)
MCH: 29.6 pg (ref 26.0–34.0)
MCHC: 33.9 g/dL (ref 30.0–36.0)
MCV: 87.3 fL (ref 80.0–100.0)
Platelets: 141 10*3/uL — ABNORMAL LOW (ref 150–400)
RBC: 2.91 MIL/uL — ABNORMAL LOW (ref 4.22–5.81)
RDW: 15.9 % — ABNORMAL HIGH (ref 11.5–15.5)
WBC: 7.1 10*3/uL (ref 4.0–10.5)
nRBC: 0.6 % — ABNORMAL HIGH (ref 0.0–0.2)

## 2019-03-03 LAB — BASIC METABOLIC PANEL
Anion gap: 6 (ref 5–15)
BUN: 12 mg/dL (ref 6–20)
CO2: 20 mmol/L — ABNORMAL LOW (ref 22–32)
Calcium: 6.8 mg/dL — ABNORMAL LOW (ref 8.9–10.3)
Chloride: 117 mmol/L — ABNORMAL HIGH (ref 98–111)
Creatinine, Ser: 1.51 mg/dL — ABNORMAL HIGH (ref 0.61–1.24)
GFR calc Af Amer: 58 mL/min — ABNORMAL LOW (ref >60)
GFR calc non Af Amer: 50 mL/min — ABNORMAL LOW (ref >60)
Glucose, Bld: 139 mg/dL — ABNORMAL HIGH (ref 70–99)
Potassium: 3.9 mmol/L (ref 3.5–5.1)
Sodium: 143 mmol/L (ref 135–145)

## 2019-03-03 LAB — CK: CK TOTAL: 5059 U/L — AB (ref 49–397)

## 2019-03-03 LAB — MAGNESIUM: MAGNESIUM: 1.7 mg/dL (ref 1.7–2.4)

## 2019-03-03 LAB — PHOSPHORUS: PHOSPHORUS: 2.7 mg/dL (ref 2.5–4.6)

## 2019-03-03 MED ORDER — PIVOT 1.5 CAL PO LIQD
1000.0000 mL | ORAL | Status: DC
  Administered 2019-03-03: 1000 mL

## 2019-03-03 MED ORDER — PRO-STAT SUGAR FREE PO LIQD
60.0000 mL | Freq: Three times a day (TID) | ORAL | Status: DC
  Administered 2019-03-03 – 2019-03-16 (×37): 60 mL
  Filled 2019-03-03 (×37): qty 60

## 2019-03-03 MED ORDER — IPRATROPIUM-ALBUTEROL 0.5-2.5 (3) MG/3ML IN SOLN
3.0000 mL | Freq: Two times a day (BID) | RESPIRATORY_TRACT | Status: DC
  Administered 2019-03-03 – 2019-03-18 (×30): 3 mL via RESPIRATORY_TRACT
  Filled 2019-03-03 (×30): qty 3

## 2019-03-03 MED ORDER — ALBUTEROL SULFATE (2.5 MG/3ML) 0.083% IN NEBU
2.5000 mg | INHALATION_SOLUTION | Freq: Four times a day (QID) | RESPIRATORY_TRACT | Status: DC | PRN
  Administered 2019-03-11: 2.5 mg via RESPIRATORY_TRACT
  Filled 2019-03-03: qty 3
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
## 2019-03-03 NOTE — Progress Notes (Addendum)
[facility]  Subjective  - Intubated sedated    Objective (!) 111/52 90 99.7 F (37.6 C) (Axillary) (!) 23 100%  Intake/Output Summary (Last 24 hours) at 03/03/2019 0848 Last data filed at 03/03/2019 0700 Gross per 24 hour  Intake 5083.78 ml  Output 4475 ml  Net 608.78 ml    Doppler signals brisk distal DP, feet warm and well perfused Right LE wound vac with 300 cc out put, left wound vac 0 output.  Assessment/Planning: 1.Exploration of right below-knee popliteal artery and vein and ligation of popliteal artery and vein 2.Harvest the right greater saphenous vein 3.Bypass right above-knee to below-knee popliteal artery with non-reversed greater saphenous vein 4.Right lower extremity 4 compartment fasciotomy with negative pressure dressing placement   Patent right LE flow  Travis Benson 03/03/2019 8:48 AM --  Laboratory Lab Results: Recent Labs    03/02/19 2305 03/03/19 0500  WBC 6.4 7.1  HGB 8.8* 8.6*  HCT 26.4* 25.4*  PLT 129* 141*   BMET Recent Labs    03/02/19 0429  03/02/19 1900 03/03/19 0500  NA 141   < > 144 143  K 4.3   < > 4.3 3.9  CL 115*  --   --  117*  CO2 23  --   --  20*  GLUCOSE 129*  --   --  139*  BUN 18  --   --  12  CREATININE 1.82*  --   --  1.51*  CALCIUM 6.6*  --   --  6.8*   < > = values in this interval not displayed.    COAG Lab Results  Component Value Date   INR 1.1 03/01/2019   INR 1.7 (H) 02/28/2019   INR 1.1 02/28/2019   No results found for: PTT   I have independently interviewed and examined patient and agree with PA assessment and plan above. Plan for wound vac change on Friday. Remains high risk to need above knee amputation.  Travis Deriso C. Randie Heinz, MD [facility] Office: [phone] Pager: [phone]

## 2019-03-03 NOTE — Progress Notes (Signed)
While RN was in room giving meds, pt started fighting ventilator, both hands on ETT w/ strong grip. Unable to be redirected with command to release grip on ETT. RN and staff removed pt hands from ETT.  Paged Dr. Donell Beers, verbal order for bilateral wrist restraints. See assoc orders.

## 2019-03-03 NOTE — Progress Notes (Addendum)
PT Cancellation Note  Patient Details Name: Travis Benson MRN: [id number] DOB: [birth year]   Cancelled Treatment:    Reason Eval/Treat Not Completed: Other (comment).  Pt is intubated and sedated.  There is a TLSO in his room, however, I need to get clarification from Dr. Lovell Sheehan re: when he wants the TLSO donned (in bed, at all times, etc) as right now orders state that he needs the TLSO for HOB >20 degrees (which would indicated to me that it has to be donned in supine and has to be on when pt is >HOB 20 degrees, but could likely be taken off when bed is flat or <20 degrees).    Thanks,  Rollene Rotunda. Clinton Wahlberg, PT, DPT  Acute Rehabilitation [phone] pager [phone]) [phone] office     Tyriana Helmkamp B Yamile Roedl 03/03/2019, 11:25 AM   03/03/2019 Addendum: I did get clarification on TLSO.  It must be on anytime [facility] is >30 degrees per Dr. Lovell Sheehan, which means we have to log roll to donn the brace.  I also talked to Montez Morita, trauma surgery PA and he said that L UE can be WBAT.  No further clarifications needed at this time. Verbal orders written for all of his WB status.  Thanks,  Rollene Rotunda. Galan Ghee, PT, DPT  Acute Rehabilitation [phone] pager [phone]) [phone] office

## 2019-03-03 NOTE — Progress Notes (Signed)
OT Cancellation Note  Patient Details Name: Travis Benson MRN: [id number] DOB: Aug 28, 1961   Cancelled Treatment:    Reason Eval/Treat Not Completed: Medical issues which prohibited therapy(Intubated and sedated. Will return as schedule allows and as pt is medically ready. Thank you.)  Keniesha Adderly M Zadaya Cuadra Erich Kochan MSOT, OTR/L Acute Rehab Pager: [phone] Office: [phone] 03/03/2019, 7:51 AM
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
## 2019-03-03 NOTE — Progress Notes (Signed)
Subjective: The patient remains intubated and heavily sedated.  By report he becomes agitated and has respiratory distress when the sedation is decreased.  Objective: Vital signs in last 24 hours: Temp:  [97.6 F (36.4 C)-99.7 F (37.6 C)] 99 F (37.2 C) (03/04 1200) Pulse Rate:  [65-102] 82 (03/04 1400) Resp:  [16-24] 18 (03/04 1400) BP: (90-145)/(42-71) 111/51 (03/04 1400) SpO2:  [99 %-100 %] 100 % (03/04 1400) Arterial Line BP: (88-192)/(42-66) 106/48 (03/04 1400) FiO2 (%):  [40 %] 40 % (03/04 1204) Estimated body mass index is 29.84 kg/m as calculated from the following:   Height as of this encounter: 6' (1.829 m).   Weight as of this encounter: 99.8 kg.   Intake/Output from previous day: 03/03 0701 - 03/04 0700 In: 5240 [I.V.:3958.1; Blood:305; IV Piggyback:661.8] Out: 5215 [Urine:3965; Drains:1050; Blood:200] Intake/Output this shift: Total I/O In: 1044.6 [I.V.:1019.1; IV Piggyback:25.5] Out: 775 [Urine:775]  Physical exam Glasgow Coma Scale 3, intubated and sedated  Lab Results: Recent Labs    03/02/19 2305 03/03/19 0500  WBC 6.4 7.1  HGB 8.8* 8.6*  HCT 26.4* 25.4*  PLT 129* 141*   BMET Recent Labs    03/02/19 0429  03/02/19 1900 03/03/19 0500  NA 141   < > 144 143  K 4.3   < > 4.3 3.9  CL 115*  --   --  117*  CO2 23  --   --  20*  GLUCOSE 129*  --   --  139*  BUN 18  --   --  12  CREATININE 1.82*  --   --  1.51*  CALCIUM 6.6*  --   --  6.8*   < > = values in this interval not displayed.    Studies/Results: Dg Si Joints  Result Date: 03/02/2019 CLINICAL DATA:  ORIF SI joint EXAM: BILATERAL SACROILIAC JOINTS - 3+ VIEW COMPARISON:  CT 02/28/2019 FINDINGS: Six low resolution intraoperative spot views of the SI joints. Images were obtained during operative course of surgical fixation of the SI joints for widened right SI joint. Total fluoroscopy time was 4 minutes 13 seconds IMPRESSION: Intraoperative fluoroscopic assistance provided during surgical  fixation of SI joints Electronically Signed   By: Jasmine Pang M.D.   On: 03/02/2019 20:52   Dg Knee 1-2 Views Right  Result Date: 03/02/2019 CLINICAL DATA:  Closed reduction right knee EXAM: RIGHT KNEE - 1-2 VIEW COMPARISON:  02/28/2019 FINDINGS: Nine low resolution intraoperative spot views of the knee. Total fluoroscopy time was 4 minutes 13 seconds. Initial image demonstrates marked widening of the lateral joint space with probable avulsed fracture fragment along the lateral joint margin. Subsequent images demonstrate placement of external fixation device through the tibia. IMPRESSION: Intraoperative fluoroscopic assistance provided during external fixation of right lower leg and reduction of lateral knee joint. Electronically Signed   By: Jasmine Pang M.D.   On: 03/02/2019 20:58   Dg Tibia/fibula Left  Result Date: 03/02/2019 CLINICAL DATA:  Left tibial IM nail EXAM: LEFT TIBIA AND FIBULA - 2 VIEW COMPARISON:  02/28/2019 FINDINGS: Six low resolution intraoperative spot views of the left tibia and fibula. Total fluoroscopy time was 4 minutes 13 seconds. Partially visualized left femoral intramedullary rod. Intramedullary rod with proximal and distal screw fixation of the tibia across comminuted mid to distal shaft fracture. Acute mildly comminuted and displaced fracture distal shaft of the fibula. Soft tissue defect at the level of the fracture consistent with open fracture. IMPRESSION: Intramedullary rod and screw fixation of the  tibia for comminuted mid to distal shaft fracture. Acute comminuted and slightly displaced distal fibular fracture. Electronically Signed   By: Jasmine Pang M.D.   On: 03/02/2019 20:55   Dg Ankle Complete Right  Result Date: 03/01/2019 CLINICAL DATA:  Right ankle pain and swelling. EXAM: RIGHT ANKLE - COMPLETE 3+ VIEW COMPARISON:  None. FINDINGS: The ankle mortise is maintained. No acute ankle fracture. The talus is intact. No obvious osteochondral lesion. The subtalar  joints appear normal. IMPRESSION: No acute bony findings or significant degenerative changes. Electronically Signed   By: Rudie Meyer M.D.   On: 03/01/2019 16:53   Dg Pelvis Comp Min 3v  Result Date: 03/02/2019 CLINICAL DATA:  Postop ORIF SI joint EXAM: JUDET PELVIS - 3+ VIEW COMPARISON:  02/28/2019 FINDINGS: Interval screw fixation of the SI joints. Slight asymmetric prominence of right SI joint compared to the left. Pubic symphysis is intact. No fracture is seen. IMPRESSION: Interval screw fixation of the SI joints. Electronically Signed   By: Jasmine Pang M.D.   On: 03/02/2019 21:16   Dg Chest Port 1 View  Result Date: 03/03/2019 CLINICAL DATA:  Respiratory failure and history of prior trauma EXAM: PORTABLE CHEST 1 VIEW COMPARISON:  03/01/2019 FINDINGS: Endotracheal tube and gastric catheter are noted in satisfactory position. Right jugular central line is noted. The lungs are well aerated bilaterally. No focal infiltrate or sizable effusion is seen. No bony abnormality is noted. IMPRESSION: Tubes and lines as described.  No acute abnormality noted. Electronically Signed   By: Alcide Clever M.D.   On: 03/03/2019 08:10   Dg Knee Right Port  Result Date: 03/02/2019 CLINICAL DATA:  Postop EXAM: PORTABLE RIGHT KNEE - 1-2 VIEW COMPARISON:  02/28/2019 CT 02/28/2019 FINDINGS: Cutaneous staples over the medial thigh. Trace knee effusion. Mild patellofemoral degenerative change. Suspected avulsed bone fragment along the lateral joint space, likely indicating ligamentous injury. Slight widening of the lateral joint space. IMPRESSION: 1. Ossific opacity adjacent to the lateral joint space, suspicious for avulse bone fragment as may be seen with ligamentous injury. Slight widening of the lateral joint space. Electronically Signed   By: Jasmine Pang M.D.   On: 03/02/2019 21:26   Dg Tibia/fibula Left Port  Result Date: 03/02/2019 CLINICAL DATA:  Postop EXAM: PORTABLE LEFT TIBIA AND FIBULA - 2 VIEW COMPARISON:   02/27/2021 FINDINGS: Intramedullary rod and screw fixation of the tibia across comminuted fracture involving the distal shaft of the tibia with decreased displacement. Surgical drain at the fracture site with overlying soft tissue wound. Heads of the distal fixating screws project into the medial soft tissues. Acute comminuted fracture distal shaft of the fibula with close to 1 bone with medial displacement of distal fracture fragment. IMPRESSION: Interval intramedullary rod and screw fixation of the tibia for comminuted fracture involving the distal shaft. Overlying soft tissue wound. Acute comminuted and displaced fracture distal shaft of the fibula. Electronically Signed   By: Jasmine Pang M.D.   On: 03/02/2019 21:22   Dg Abd Portable 1v  Result Date: 03/03/2019 CLINICAL DATA:  Check gastric catheter placement EXAM: PORTABLE ABDOMEN - 1 VIEW COMPARISON:  None. FINDINGS: Scattered large and small bowel gas is noted. Postsurgical changes are noted traversing the sacroiliac joints bilaterally. Gastric catheter is noted within the mid to distal stomach. No free air is seen. IMPRESSION: Gastric catheter within the mid to distal stomach. Electronically Signed   By: Alcide Clever M.D.   On: 03/03/2019 08:10   Dg C-arm 1-60 Min  Result Date: 03/02/2019 CLINICAL DATA:  ORIF right SI joint, left femoral nail, left tibial IM nail closed reduction right knee with external fixation EXAM: DG C-ARM 61-120 MIN; LEFT FEMUR 2 VIEWS COMPARISON:  Radiographs and CT 02/28/2019 FINDINGS: Five low resolution intraoperative spot views of the left femur. Total fluoroscopy time was 4 minutes 13 seconds. The images demonstrate intramedullary rod and screw fixation of the left femur across comminuted femoral shaft fracture with decreased displacement of fracture fragments. IMPRESSION: Intraoperative fluoroscopic assistance provided during surgical fixation of left femoral fracture Electronically Signed   By: Jasmine Pang M.D.   On:  03/02/2019 20:49   Dg Femur Min 2 Views Left  Result Date: 03/02/2019 CLINICAL DATA:  ORIF right SI joint, left femoral nail, left tibial IM nail closed reduction right knee with external fixation EXAM: DG C-ARM 61-120 MIN; LEFT FEMUR 2 VIEWS COMPARISON:  Radiographs and CT 02/28/2019 FINDINGS: Five low resolution intraoperative spot views of the left femur. Total fluoroscopy time was 4 minutes 13 seconds. The images demonstrate intramedullary rod and screw fixation of the left femur across comminuted femoral shaft fracture with decreased displacement of fracture fragments. IMPRESSION: Intraoperative fluoroscopic assistance provided during surgical fixation of left femoral fracture Electronically Signed   By: Jasmine Pang M.D.   On: 03/02/2019 20:49   Dg Femur Port Min 2 Views Left  Result Date: 03/02/2019 CLINICAL DATA:  Postop EXAM: LEFT FEMUR PORTABLE 2 VIEWS COMPARISON:  02/28/2019 FINDINGS: Interval intramedullary rod and screw fixation of the left femur across comminuted femoral shaft fracture. Displaced femoral shaft fracture fragment which demonstrates approximately 1.9 cm separation from the parent bone on the lateral view. Gas within the suprapatellar soft tissues consistent with recent surgery. IMPRESSION: Interval intramedullary rod and screw fixation of the femur for comminuted fracture involving the femoral shaft. Displaced/separated long femoral shaft fracture fragment. Electronically Signed   By: Jasmine Pang M.D.   On: 03/02/2019 21:19    Assessment/Plan: Thoracic fractures: This should heal in a TLSO.  We will get a better neurologic exam once the sedation is able to be discontinued.  LOS: 3 days     Travis Benson 03/03/2019, 2:13 PM

## 2019-03-04 ENCOUNTER — Other Ambulatory Visit: Payer: Self-pay

## 2019-03-04 ENCOUNTER — Encounter (HOSPITAL_COMMUNITY): Payer: Self-pay | Admitting: Anesthesiology

## 2019-03-04 LAB — BPAM RBC
Blood Product Expiration Date: 202003052359
Blood Product Expiration Date: 202003062359
Blood Product Expiration Date: 202003062359
Blood Product Expiration Date: 202003072359
Blood Product Expiration Date: 202003192359
Blood Product Expiration Date: 202003192359
Blood Product Expiration Date: 202003232359
Blood Product Expiration Date: 202003242359
Blood Product Expiration Date: 202003242359
Blood Product Expiration Date: 202003262359
Blood Product Expiration Date: 202003262359
Blood Product Expiration Date: 202003262359
Blood Product Expiration Date: 202003262359
Blood Product Expiration Date: 202003272359
Blood Product Expiration Date: 202003272359
Blood Product Expiration Date: 202003272359
Blood Product Expiration Date: 202003272359
Blood Product Expiration Date: 202003272359
Blood Product Expiration Date: 202004012359
Blood Product Expiration Date: 202004012359
Blood Product Expiration Date: 202004012359
ISSUE DATE / TIME: 202003011544
ISSUE DATE / TIME: 202003011544
ISSUE DATE / TIME: 202003011653
ISSUE DATE / TIME: 202003011653
ISSUE DATE / TIME: 202003011911
ISSUE DATE / TIME: 202003011911
ISSUE DATE / TIME: 202003011912
ISSUE DATE / TIME: 202003011912
ISSUE DATE / TIME: 202003012028
ISSUE DATE / TIME: 202003020112
ISSUE DATE / TIME: 202003020748
ISSUE DATE / TIME: 202003021039
ISSUE DATE / TIME: 202003021912
ISSUE DATE / TIME: 202003021912
ISSUE DATE / TIME: 202003030043
ISSUE DATE / TIME: 202003030739
ISSUE DATE / TIME: 202003031401
ISSUE DATE / TIME: 202003031401
ISSUE DATE / TIME: 202003031949
ISSUE DATE / TIME: 202003032255
Unit Type and Rh: 5100
Unit Type and Rh: 5100
Unit Type and Rh: 5100
Unit Type and Rh: 5100
Unit Type and Rh: 5100
Unit Type and Rh: 5100
Unit Type and Rh: 5100
Unit Type and Rh: 5100
Unit Type and Rh: 5100
Unit Type and Rh: 5100
Unit Type and Rh: 5100
Unit Type and Rh: 5100
Unit Type and Rh: 5100
Unit Type and Rh: 5100
Unit Type and Rh: 5100
Unit Type and Rh: 5100
Unit Type and Rh: 5100
Unit Type and Rh: 9500
Unit Type and Rh: 9500
Unit Type and Rh: 9500
Unit Type and Rh: 9500

## 2019-03-04 LAB — BASIC METABOLIC PANEL
Anion gap: 10 (ref 5–15)
BUN: 17 mg/dL (ref 6–20)
CO2: 20 mmol/L — ABNORMAL LOW (ref 22–32)
CREATININE: 1.32 mg/dL — AB (ref 0.61–1.24)
Calcium: 6.9 mg/dL — ABNORMAL LOW (ref 8.9–10.3)
Chloride: 114 mmol/L — ABNORMAL HIGH (ref 98–111)
GFR calc Af Amer: >60 mL/min (ref >60)
GFR calc non Af Amer: 59 mL/min — ABNORMAL LOW (ref >60)
Glucose, Bld: 134 mg/dL — ABNORMAL HIGH (ref 70–99)
Potassium: 3.4 mmol/L — ABNORMAL LOW (ref 3.5–5.1)
Sodium: 144 mmol/L (ref 135–145)

## 2019-03-04 LAB — CBC
HCT: 23.1 % — ABNORMAL LOW (ref 39.0–52.0)
Hemoglobin: 7.5 g/dL — ABNORMAL LOW (ref 13.0–17.0)
MCH: 29 pg (ref 26.0–34.0)
MCHC: 32.5 g/dL (ref 30.0–36.0)
MCV: 89.2 fL (ref 80.0–100.0)
Platelets: 158 10*3/uL (ref 150–400)
RBC: 2.59 MIL/uL — ABNORMAL LOW (ref 4.22–5.81)
RDW: 16.1 % — AB (ref 11.5–15.5)
WBC: 9.4 10*3/uL (ref 4.0–10.5)
nRBC: 1.1 % — ABNORMAL HIGH (ref 0.0–0.2)

## 2019-03-04 LAB — TYPE AND SCREEN
ABO/RH(D): O POS
ANTIBODY SCREEN: NEGATIVE
UNIT DIVISION: 0
UNIT DIVISION: 0
UNIT DIVISION: 0
Unit division: 0
Unit division: 0
Unit division: 0
Unit division: 0
Unit division: 0
Unit division: 0
Unit division: 0
Unit division: 0
Unit division: 0
Unit division: 0
Unit division: 0
Unit division: 0
Unit division: 0
Unit division: 0
Unit division: 0
Unit division: 0
Unit division: 0
Unit division: 0

## 2019-03-04 LAB — GLUCOSE, CAPILLARY
GLUCOSE-CAPILLARY: 117 mg/dL — AB (ref 70–99)
Glucose-Capillary: 127 mg/dL — ABNORMAL HIGH (ref 70–99)
Glucose-Capillary: 130 mg/dL — ABNORMAL HIGH (ref 70–99)
Glucose-Capillary: 135 mg/dL — ABNORMAL HIGH (ref 70–99)
Glucose-Capillary: 87 mg/dL (ref 70–99)

## 2019-03-04 LAB — PREPARE RBC (CROSSMATCH)

## 2019-03-04 LAB — PHOSPHORUS
Phosphorus: 2 mg/dL — ABNORMAL LOW (ref 2.5–4.6)
Phosphorus: 1.8 mg/dL — ABNORMAL LOW (ref 2.5–4.6)
Phosphorus: 2.3 mg/dL — ABNORMAL LOW (ref 2.5–4.6)

## 2019-03-04 LAB — MAGNESIUM
Magnesium: 1.7 mg/dL (ref 1.7–2.4)
Magnesium: 1.9 mg/dL (ref 1.7–2.4)
Magnesium: 1.9 mg/dL (ref 1.7–2.4)

## 2019-03-04 LAB — TRIGLYCERIDES: Triglycerides: 102 mg/dL (ref <150)

## 2019-03-04 MED ORDER — POTASSIUM CHLORIDE 20 MEQ/15ML (10%) PO SOLN
40.0000 meq | Freq: Once | ORAL | Status: AC
  Administered 2019-03-04: 40 meq via ORAL
  Filled 2019-03-04: qty 30

## 2019-03-04 MED ORDER — POLYETHYLENE GLYCOL 3350 17 G PO PACK
17.0000 g | PACK | Freq: Every day | ORAL | Status: DC
  Administered 2019-03-04 – 2019-03-14 (×8): 17 g
  Filled 2019-03-04 (×8): qty 1

## 2019-03-04 MED ORDER — CALCIUM GLUCONATE-NACL 1-0.675 GM/50ML-% IV SOLN
1.0000 g | Freq: Once | INTRAVENOUS | Status: AC
  Administered 2019-03-04: 1000 mg via INTRAVENOUS
  Filled 2019-03-04: qty 50

## 2019-03-04 MED ORDER — PIVOT 1.5 CAL PO LIQD
1000.0000 mL | ORAL | Status: DC
  Administered 2019-03-05 – 2019-03-15 (×8): 1000 mL

## 2019-03-04 MED ORDER — SODIUM CHLORIDE 0.9% IV SOLUTION: Freq: Once | INTRAVENOUS | Status: DC

## 2019-03-04 NOTE — H&P (View-Only) (Signed)
  Progress Note    03/04/2019 2:37 PM 2 Days Post-Op  Subjective:  Remains intubated and sedated  Vitals:   03/04/19 1300 03/04/19 1400  BP:  (!) 158/82  Pulse: 93 91  Resp: 19 18  Temp:    SpO2: 100% 100%    Physical Exam: Intubated and sedated Abdomen is soft Right thigh dressings cdi Wound vac to suction Palpable right dp   CBC    Component Value Date/Time   WBC 9.4 03/04/2019 0806   RBC 2.59 (L) 03/04/2019 0806   HGB 7.5 (L) 03/04/2019 0806   HCT 23.1 (L) 03/04/2019 0806   PLT 158 03/04/2019 0806   MCV 89.2 03/04/2019 0806   MCH 29.0 03/04/2019 0806   MCHC 32.5 03/04/2019 0806   RDW 16.1 (H) 03/04/2019 0806    BMET    Component Value Date/Time   NA 144 03/04/2019 0806   K 3.4 (L) 03/04/2019 0806   CL 114 (H) 03/04/2019 0806   CO2 20 (L) 03/04/2019 0806   GLUCOSE 134 (H) 03/04/2019 0806   BUN 17 03/04/2019 0806   CREATININE 1.32 (H) 03/04/2019 0806   CALCIUM 6.9 (L) 03/04/2019 0806   GFRNONAA 59 (L) 03/04/2019 0806   GFRAA >60 03/04/2019 0806    INR    Component Value Date/Time   INR 1.1 03/01/2019 1450     Intake/Output Summary (Last 24 hours) at 03/04/2019 1437 Last data filed at 03/04/2019 1400 Gross per 24 hour  Intake 4490.62 ml  Output 2925 ml  Net 1565.62 ml     Assessment:  58 y.o. male is s/p:  1.Exploration of right below-knee popliteal artery and vein and ligation of popliteal artery and vein 2.Harvest the right greater saphenous vein 3.Bypass right above-knee to below-knee popliteal artery with non-reversed greater saphenous vein 4.Right lower extremity 4 compartment fasciotomy with negative pressure dressing placement  Plan: OR tomorrow for wound vac change   I have discussed with his wife the procedure tomorrow and the best plan moving forward to either continue wound VAC changes with ex-fixator or proceed with above-knee amputation.  Given that his leg is not a threat to his life at this time appears well perfused we  can wait till he is extubated to have this conversation with him.  Will need at least twice weekly wound VAC changes.   Zalman Hull C. Randie Heinz, MD [facility] Office: [phone] Pager: [phone]  03/04/2019 2:37 PM

## 2019-03-04 NOTE — Progress Notes (Signed)
PT Cancellation Note  Patient Details Name: Travis Benson MRN: [id number] DOB: 1961-11-30   Cancelled Treatment:    Reason Eval/Treat Not Completed: Other (comment).  Pt weaning better, but not to extubate today as he will go back to OR tomorrow and hopefully extubate soon after.  PT to check back Saturday to see if initiating mobility is appropriate.    Thanks,  Rollene Rotunda. Travis Benson, PT, DPT  Acute Rehabilitation [phone] pager [phone]) [phone] office     Lurena Joiner B Amin Fornwalt 03/04/2019, 4:07 PM

## 2019-03-04 NOTE — Progress Notes (Signed)
  Progress Note    03/04/2019 2:37 PM 2 Days Post-Op  Subjective:  Remains intubated and sedated  Vitals:   03/04/19 1300 03/04/19 1400  BP:  (!) 158/82  Pulse: 93 91  Resp: 19 18  Temp:    SpO2: 100% 100%    Physical Exam: Intubated and sedated Abdomen is soft Right thigh dressings cdi Wound vac to suction Palpable right dp   CBC    Component Value Date/Time   WBC 9.4 03/04/2019 0806   RBC 2.59 (L) 03/04/2019 0806   HGB 7.5 (L) 03/04/2019 0806   HCT 23.1 (L) 03/04/2019 0806   PLT 158 03/04/2019 0806   MCV 89.2 03/04/2019 0806   MCH 29.0 03/04/2019 0806   MCHC 32.5 03/04/2019 0806   RDW 16.1 (H) 03/04/2019 0806    BMET    Component Value Date/Time   NA 144 03/04/2019 0806   K 3.4 (L) 03/04/2019 0806   CL 114 (H) 03/04/2019 0806   CO2 20 (L) 03/04/2019 0806   GLUCOSE 134 (H) 03/04/2019 0806   BUN 17 03/04/2019 0806   CREATININE 1.32 (H) 03/04/2019 0806   CALCIUM 6.9 (L) 03/04/2019 0806   GFRNONAA 59 (L) 03/04/2019 0806   GFRAA >60 03/04/2019 0806    INR    Component Value Date/Time   INR 1.1 03/01/2019 1450     Intake/Output Summary (Last 24 hours) at 03/04/2019 1437 Last data filed at 03/04/2019 1400 Gross per 24 hour  Intake 4490.62 ml  Output 2925 ml  Net 1565.62 ml     Assessment:  57 y.o. male is s/p:  1.Exploration of right below-knee popliteal artery and vein and ligation of popliteal artery and vein 2.Harvest the right greater saphenous vein 3.Bypass right above-knee to below-knee popliteal artery with non-reversed greater saphenous vein 4.Right lower extremity 4 compartment fasciotomy with negative pressure dressing placement  Plan: OR tomorrow for wound vac change   I have discussed with his wife the procedure tomorrow and the best plan moving forward to either continue wound VAC changes with ex-fixator or proceed with above-knee amputation.  Given that his leg is not a threat to his life at this time appears well perfused we  can wait till he is extubated to have this conversation with him.  Will need at least twice weekly wound VAC changes.   Sylver Vantassell C. Adam Sanjuan, MD [facility] Office: [phone] Pager: [phone]  03/04/2019 2:37 PM  
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
## 2019-03-04 NOTE — Anesthesia Postprocedure Evaluation (Signed)
Anesthesia Post Note  Patient: Ladislav Glotfelty  Procedure(s) Performed: INTRAMEDULLARY (IM) RETROGRADE FEMORAL NAILING (Left Leg Upper) IRRIGATION AND DEBRIDEMENT LEG (Left Leg Lower) REMOVAL EXTERNAL FIXATION LEG (Left Knee) INTRAMEDULLARY (IM) NAIL TIBIAL (Left Leg Lower) Orif Pelvic Fracture With Percutaneous Screws (Right Pelvis) Application Of Wound Vac (Bilateral Leg Lower) External Fixation Leg (Right Leg Lower) Wound Vac dressing removal (Right Leg Lower)     Patient location during evaluation: SICU Anesthesia Type: General Level of consciousness: sedated Pain management: pain level controlled Vital Signs Assessment: post-procedure vital signs reviewed and stable Respiratory status: patient remains intubated per anesthesia plan Cardiovascular status: stable Postop Assessment: no apparent nausea or vomiting Anesthetic complications: no    Last Vitals:  Vitals:   03/04/19 1800 03/04/19 1900  BP: (!) 106/56   Pulse: 66 69  Resp:    Temp:    SpO2: 100% 100%    Last Pain:  Vitals:   03/04/19 1600  TempSrc: Axillary  PainSc:                  Krystelle Prashad
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
## 2019-03-05 ENCOUNTER — Inpatient Hospital Stay (HOSPITAL_COMMUNITY): Payer: Medicaid Other

## 2019-03-05 ENCOUNTER — Inpatient Hospital Stay (HOSPITAL_COMMUNITY): Payer: Medicaid Other | Admitting: Anesthesiology

## 2019-03-05 ENCOUNTER — Encounter (HOSPITAL_COMMUNITY): Admission: EM | Disposition: A | Discharge status: Skilled Nursing Facility | Payer: Self-pay | Source: Home / Self Care

## 2019-03-05 HISTORY — PX: APPLICATION OF WOUND VAC: SHX5189

## 2019-03-05 LAB — BASIC METABOLIC PANEL
ANION GAP: 6 (ref 5–15)
BUN: 21 mg/dL — ABNORMAL HIGH (ref 6–20)
CO2: 19 mmol/L — ABNORMAL LOW (ref 22–32)
Calcium: 6.8 mg/dL — ABNORMAL LOW (ref 8.9–10.3)
Chloride: 117 mmol/L — ABNORMAL HIGH (ref 98–111)
Creatinine, Ser: 1.19 mg/dL (ref 0.61–1.24)
GFR calc non Af Amer: >60 mL/min (ref >60)
Glucose, Bld: 113 mg/dL — ABNORMAL HIGH (ref 70–99)
Potassium: 3.5 mmol/L (ref 3.5–5.1)
Sodium: 142 mmol/L (ref 135–145)

## 2019-03-05 LAB — CBC
HCT: 25.4 % — ABNORMAL LOW (ref 39.0–52.0)
Hemoglobin: 8.4 g/dL — ABNORMAL LOW (ref 13.0–17.0)
MCH: 29.8 pg (ref 26.0–34.0)
MCHC: 33.1 g/dL (ref 30.0–36.0)
MCV: 90.1 fL (ref 80.0–100.0)
Platelets: 190 10*3/uL (ref 150–400)
RBC: 2.82 MIL/uL — ABNORMAL LOW (ref 4.22–5.81)
RDW: 15.8 % — AB (ref 11.5–15.5)
WBC: 12.6 10*3/uL — ABNORMAL HIGH (ref 4.0–10.5)
nRBC: 0.5 % — ABNORMAL HIGH (ref 0.0–0.2)

## 2019-03-05 LAB — GLUCOSE, CAPILLARY
GLUCOSE-CAPILLARY: 97 mg/dL (ref 70–99)
GLUCOSE-CAPILLARY: 121 mg/dL — AB (ref 70–99)
Glucose-Capillary: 111 mg/dL — ABNORMAL HIGH (ref 70–99)
Glucose-Capillary: 70 mg/dL (ref 70–99)
Glucose-Capillary: 71 mg/dL (ref 70–99)
Glucose-Capillary: 76 mg/dL (ref 70–99)

## 2019-03-05 SURGERY — APPLICATION, WOUND VAC
Anesthesia: General | Site: Leg Lower | Laterality: Bilateral

## 2019-03-05 MED ORDER — 0.9 % SODIUM CHLORIDE (POUR BTL) OPTIME
TOPICAL | Status: DC | PRN
  Administered 2019-03-05: 1000 mL

## 2019-03-05 MED ORDER — LACTATED RINGERS IV SOLN
INTRAVENOUS | Status: DC | PRN
  Administered 2019-03-05: 10:00:00 via INTRAVENOUS

## 2019-03-05 MED ORDER — FENTANYL CITRATE (PF) 250 MCG/5ML IJ SOLN
INTRAMUSCULAR | Status: AC
  Filled 2019-03-05: qty 5

## 2019-03-05 MED ORDER — PROPOFOL 10 MG/ML IV BOLUS
INTRAVENOUS | Status: DC | PRN
  Administered 2019-03-05: 50 mg via INTRAVENOUS

## 2019-03-05 MED ORDER — ENOXAPARIN SODIUM 40 MG/0.4ML ~~LOC~~ SOLN
40.0000 mg | SUBCUTANEOUS | Status: DC
Start: 2019-03-06 — End: 2019-04-28
  Administered 2019-03-06 – 2019-04-28 (×51): 40 mg via SUBCUTANEOUS
  Filled 2019-03-05 (×53): qty 0.4

## 2019-03-05 MED ORDER — FENTANYL CITRATE (PF) 250 MCG/5ML IJ SOLN
INTRAMUSCULAR | Status: DC | PRN
  Administered 2019-03-05 (×5): 50 ug via INTRAVENOUS

## 2019-03-05 MED ORDER — CALCIUM GLUCONATE-NACL 1-0.675 GM/50ML-% IV SOLN
1.0000 g | Freq: Once | INTRAVENOUS | Status: AC
  Administered 2019-03-05: 1000 mg via INTRAVENOUS
  Filled 2019-03-05: qty 50

## 2019-03-05 SURGICAL SUPPLY — 37 items
BANDAGE ELASTIC 6 VELCRO ST LF (GAUZE/BANDAGES/DRESSINGS) ×2 IMPLANT
BNDG GAUZE ELAST 4 BULKY (GAUZE/BANDAGES/DRESSINGS) ×2 IMPLANT
CANISTER SUCT 3000ML PPV (MISCELLANEOUS) ×6 IMPLANT
CANISTER WOUND CARE 500ML ATS (WOUND CARE) ×2 IMPLANT
COVER BACK TABLE 60X90IN (DRAPES) ×2 IMPLANT
COVER SURGICAL LIGHT HANDLE (MISCELLANEOUS) ×3 IMPLANT
COVER WAND RF STERILE (DRAPES) ×3 IMPLANT
DRAPE INCISE IOBAN 66X45 STRL (DRAPES) ×4 IMPLANT
DRSG VAC ATS LRG SENSATRAC (GAUZE/BANDAGES/DRESSINGS) ×4 IMPLANT
DRSG VAC ATS MED SENSATRAC (GAUZE/BANDAGES/DRESSINGS) IMPLANT
ELECT REM PT RETURN 9FT ADLT (ELECTROSURGICAL) ×3
ELECTRODE REM PT RTRN 9FT ADLT (ELECTROSURGICAL) ×1 IMPLANT
GLOVE BIOGEL PI IND STRL 6.5 (GLOVE) IMPLANT
GLOVE BIOGEL PI IND STRL 7.0 (GLOVE) IMPLANT
GLOVE BIOGEL PI IND STRL 7.5 (GLOVE) IMPLANT
GLOVE BIOGEL PI INDICATOR 6.5 (GLOVE) ×2
GLOVE BIOGEL PI INDICATOR 7.0 (GLOVE) ×6
GLOVE BIOGEL PI INDICATOR 7.5 (GLOVE) ×2
GLOVE ECLIPSE 7.0 STRL STRAW (GLOVE) ×2 IMPLANT
GLOVE SS BIOGEL STRL SZ 7.5 (GLOVE) ×1 IMPLANT
GLOVE SUPERSENSE BIOGEL SZ 7.5 (GLOVE) ×2
GLOVE SURG SS PI 6.0 STRL IVOR (GLOVE) ×2 IMPLANT
GOWN STRL REUS W/ TWL LRG LVL3 (GOWN DISPOSABLE) ×3 IMPLANT
GOWN STRL REUS W/TWL LRG LVL3 (GOWN DISPOSABLE) ×8
HANDPIECE INTERPULSE COAX TIP (DISPOSABLE)
KIT BASIN OR (CUSTOM PROCEDURE TRAY) ×3 IMPLANT
KIT TURNOVER KIT B (KITS) ×3 IMPLANT
NS IRRIG 1000ML POUR BTL (IV SOLUTION) ×3 IMPLANT
PACK GENERAL/GYN (CUSTOM PROCEDURE TRAY) ×3 IMPLANT
PACK UNIVERSAL I (CUSTOM PROCEDURE TRAY) ×2 IMPLANT
PAD ABD 8X10 STRL (GAUZE/BANDAGES/DRESSINGS) ×2 IMPLANT
PAD ARMBOARD 7.5X6 YLW CONV (MISCELLANEOUS) ×6 IMPLANT
SET HNDPC FAN SPRY TIP SCT (DISPOSABLE) IMPLANT
TOWEL GREEN STERILE (TOWEL DISPOSABLE) ×3 IMPLANT
TUBE CONNECTING 20'X1/4 (TUBING) ×1
TUBE CONNECTING 20X1/4 (TUBING) ×1 IMPLANT
WATER STERILE IRR 1000ML POUR (IV SOLUTION) IMPLANT

## 2019-03-05 NOTE — Transfer of Care (Signed)
Immediate Anesthesia Transfer of Care Note  Patient: Travis Benson  Procedure(s) Performed: WOUND VAC CHANGE RIGHT LOWER LEG; REMOVAL OF WOUND VAC LEFT LOWER LEG WITH APPLICATION OF DRESSINGS (Bilateral Leg Lower)  Patient Location: ICU  Anesthesia Type:General  Level of Consciousness: sedated and Patient remains intubated per anesthesia plan  Airway & Oxygen Therapy: Patient remains intubated per anesthesia plan and Patient placed on Ventilator (see vital sign flow sheet for setting)  Post-op Assessment: Report given to RN and Post -op Vital signs reviewed and stable  Post vital signs: Reviewed and stable  Last Vitals:  Vitals Value Taken Time  BP    Temp    Pulse    Resp    SpO2      Last Pain:  Vitals:   03/05/19 0802  TempSrc: Axillary  PainSc:          Complications: No apparent anesthesia complications
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
## 2019-03-05 NOTE — Interval H&P Note (Signed)
History and Physical Interval Note:  03/05/2019 9:09 AM  Travis Benson  has presented today for surgery, with the diagnosis of wound right leg  The various methods of treatment have been discussed with the patient and family. After consideration of risks, benefits and other options for treatment, the patient has consented to  Procedure(s): APPLICATION OF WOUND VAC CHANGE RIGHT LOWER LEG (Right) as a surgical intervention .  The patient's history has been reviewed, patient examined, no change in status, stable for surgery.  I have reviewed the patient's chart and labs.  Questions were answered to the patient's satisfaction.     [name]

## 2019-03-05 NOTE — Anesthesia Postprocedure Evaluation (Signed)
Anesthesia Post Note  Patient: Travis Benson  Procedure(s) Performed: WOUND VAC CHANGE RIGHT LOWER LEG; REMOVAL OF WOUND VAC LEFT LOWER LEG WITH APPLICATION OF DRESSINGS (Bilateral Leg Lower)     Patient location during evaluation: ICU Anesthesia Type: General Level of consciousness: patient remains intubated per anesthesia plan Pain management: pain level controlled Vital Signs Assessment: post-procedure vital signs reviewed and stable Respiratory status: patient remains intubated per anesthesia plan Cardiovascular status: blood pressure returned to baseline and stable Postop Assessment: no apparent nausea or vomiting Anesthetic complications: no    Last Vitals:  Vitals:   03/05/19 0930 03/05/19 1100  BP:  (!) 105/53  Pulse: 64 65  Resp: 18 18  Temp:    SpO2: 100% 100%    Last Pain:  Vitals:   03/05/19 0802  TempSrc: Axillary  PainSc:                  Travis Benson A.

## 2019-03-05 NOTE — Progress Notes (Signed)
OT Cancellation Note  Patient Details Name: Travis Benson MRN: [id number] DOB: Apr 27, 1961   Cancelled Treatment:    Reason Eval/Treat Not Completed: Patient not medically ready.  Per chart, pt to return to OR today.  Will check back.  Jeani Hawking, OTR/L Acute Rehabilitation Services Pager [phone] Office [phone]   Jeani Hawking M 03/05/2019, 5:16 AM
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
## 2019-03-06 ENCOUNTER — Inpatient Hospital Stay (HOSPITAL_COMMUNITY): Payer: Medicaid Other

## 2019-03-06 LAB — BASIC METABOLIC PANEL
Anion gap: 7 (ref 5–15)
BUN: 23 mg/dL — ABNORMAL HIGH (ref 6–20)
CO2: 21 mmol/L — ABNORMAL LOW (ref 22–32)
Calcium: 7.7 mg/dL — ABNORMAL LOW (ref 8.9–10.3)
Chloride: 114 mmol/L — ABNORMAL HIGH (ref 98–111)
Creatinine, Ser: 1.07 mg/dL (ref 0.61–1.24)
GFR calc Af Amer: >60 mL/min (ref >60)
GFR calc non Af Amer: >60 mL/min (ref >60)
Glucose, Bld: 116 mg/dL — ABNORMAL HIGH (ref 70–99)
Potassium: 3.9 mmol/L (ref 3.5–5.1)
Sodium: 142 mmol/L (ref 135–145)

## 2019-03-06 LAB — CBC
HCT: 30.5 % — ABNORMAL LOW (ref 39.0–52.0)
Hemoglobin: 9.7 g/dL — ABNORMAL LOW (ref 13.0–17.0)
MCH: 29.8 pg (ref 26.0–34.0)
MCHC: 31.8 g/dL (ref 30.0–36.0)
MCV: 93.6 fL (ref 80.0–100.0)
NRBC: 1.2 % — AB (ref 0.0–0.2)
Platelets: 266 10*3/uL (ref 150–400)
RBC: 3.26 MIL/uL — AB (ref 4.22–5.81)
RDW: 15.9 % — ABNORMAL HIGH (ref 11.5–15.5)
WBC: 12.6 10*3/uL — ABNORMAL HIGH (ref 4.0–10.5)

## 2019-03-06 LAB — TRIGLYCERIDES: Triglycerides: 339 mg/dL — ABNORMAL HIGH (ref <150)

## 2019-03-06 LAB — GLUCOSE, CAPILLARY
GLUCOSE-CAPILLARY: 90 mg/dL (ref 70–99)
Glucose-Capillary: 120 mg/dL — ABNORMAL HIGH (ref 70–99)
Glucose-Capillary: 88 mg/dL (ref 70–99)
Glucose-Capillary: 108 mg/dL — ABNORMAL HIGH (ref 70–99)
Glucose-Capillary: 121 mg/dL — ABNORMAL HIGH (ref 70–99)
Glucose-Capillary: 116 mg/dL — ABNORMAL HIGH (ref 70–99)

## 2019-03-06 NOTE — Progress Notes (Signed)
Pt noted to be breathing over the vent at around 35 breaths per minute.  Medication given to sedate the patient with no effect.  After carefully assessing the pt it was noted that tube feed-like fluid was building up in his mouth from the back of his throat.  OG tube placement was still in the same position but upon auscultation of lungs, a small amount of rhonchi were noted bilaterally  Trauma on call MD notified and verbal orders to stop tube feeds and hook OG to low intermittent suction given.  Will continue to monitor patient for any changes in status.

## 2019-03-06 NOTE — Progress Notes (Signed)
PT Cancellation Note  Patient Details Name: Travis Benson MRN: [id number] DOB: 07/25/1961   Cancelled Treatment:    Reason Eval/Treat Not Completed: Patient not medically ready. Discussed pt case with RN who asks PT to hold at this time. Will continue to follow and initiate PT eval when pt medically ready.    Marylynn Pearson 03/06/2019, 10:47 AM   Conni Slipper, PT, DPT Acute Rehabilitation Services Pager: [phone] Office: [phone]
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
## 2019-03-06 NOTE — Progress Notes (Signed)
[facility]  Subjective  - Remains intubated/sedated.  OR yesterday for vac change to right lower extremity lateral and medial calf.     Objective 132/69 80 98.5 F (36.9 C) (Axillary) 19 100%  Intake/Output Summary (Last 24 hours) at 03/06/2019 0924 Last data filed at 03/06/2019 0700 Gross per 24 hour  Intake 3501.65 ml  Output 4345 ml  Net -843.35 ml    Palpable right DP Right leg vac to medial and lateral fasciotomy with good seal - serosanguinous drainage  Laboratory Lab Results: Recent Labs    03/05/19 0420 03/06/19 0615  WBC 12.6* 12.6*  HGB 8.4* 9.7*  HCT 25.4* 30.5*  PLT 190 266   BMET Recent Labs    03/05/19 0420 03/06/19 0615  NA 142 142  K 3.5 3.9  CL 117* 114*  CO2 19* 21*  GLUCOSE 113* 116*  BUN 21* 23*  CREATININE 1.19 1.07  CALCIUM 6.8* 7.7*    COAG Lab Results  Component Value Date   INR 1.1 03/01/2019   INR 1.7 (H) 02/28/2019   INR 1.1 02/28/2019   No results found for: PTT  Assessment/Planning: Right foot remains well perfused with palpable DP.  Continue vacs to right medial and lateral fasciotomy - changed in OR yesterday.  Will need to further evaluate function of right leg after extubated.    Cephus Shelling 03/06/2019 9:24 AM --

## 2019-03-07 ENCOUNTER — Inpatient Hospital Stay (HOSPITAL_COMMUNITY): Payer: Medicaid Other

## 2019-03-07 LAB — GLUCOSE, CAPILLARY
Glucose-Capillary: 98 mg/dL (ref 70–99)
Glucose-Capillary: 87 mg/dL (ref 70–99)
Glucose-Capillary: 108 mg/dL — ABNORMAL HIGH (ref 70–99)
Glucose-Capillary: 89 mg/dL (ref 70–99)
Glucose-Capillary: 105 mg/dL — ABNORMAL HIGH (ref 70–99)
Glucose-Capillary: 99 mg/dL (ref 70–99)

## 2019-03-07 MED ORDER — HYDRALAZINE HCL 20 MG/ML IJ SOLN
5.0000 mg | INTRAMUSCULAR | Status: DC | PRN
  Administered 2019-03-08 – 2019-03-12 (×2): 5 mg via INTRAVENOUS
  Filled 2019-03-07 (×2): qty 1

## 2019-03-07 MED ORDER — BISACODYL 10 MG RE SUPP
10.0000 mg | Freq: Every day | RECTAL | Status: DC | PRN
  Administered 2019-03-07 – 2019-03-11 (×2): 10 mg via RECTAL
  Filled 2019-03-07 (×2): qty 1

## 2019-03-07 MED ORDER — LABETALOL HCL 5 MG/ML IV SOLN
10.0000 mg | INTRAVENOUS | Status: DC | PRN
  Administered 2019-03-07 – 2019-04-03 (×11): 10 mg via INTRAVENOUS
  Filled 2019-03-07 (×14): qty 4
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
## 2019-03-07 NOTE — Progress Notes (Signed)
[facility] [place]  Subjective  - Remains intubated/sedated.  OR Friday for vac change to right lower extremity lateral and medial calf.     Objective (!) 152/70 (!) 110 99.1 F (37.3 C) (Axillary) (!) 22 100%  Intake/Output Summary (Last 24 hours) at 03/07/2019 0922 Last data filed at 03/07/2019 0800 Gross per 24 hour  Intake 4195.33 ml  Output 3950 ml  Net 245.33 ml    Palpable right DP Right leg vac to medial and lateral fasciotomy with good seal - serosanguinous drainage  Laboratory Lab Results: Recent Labs    03/05/19 0420 03/06/19 0615  WBC 12.6* 12.6*  HGB 8.4* 9.7*  HCT 25.4* 30.5*  PLT 190 266   BMET Recent Labs    03/05/19 0420 03/06/19 0615  NA 142 142  K 3.5 3.9  CL 117* 114*  CO2 19* 21*  GLUCOSE 113* 116*  BUN 21* 23*  CREATININE 1.19 1.07  CALCIUM 6.8* 7.7*    COAG Lab Results  Component Value Date   INR 1.1 03/01/2019   INR 1.7 (H) 02/28/2019   INR 1.1 02/28/2019   No results found for: PTT  Assessment/Planning: Right foot remains well perfused with palpable DP.  Continue vacs to right medial and lateral fasciotomy - changed in OR Friday.  Dr. Randie Heinz will decide next vac change to fasciotomies this week.  Will need to further evaluate function of right leg after extubated.    Cephus Shelling 03/07/2019 9:22 AM --

## 2019-03-08 ENCOUNTER — Encounter (HOSPITAL_COMMUNITY): Payer: Self-pay | Admitting: Vascular Surgery

## 2019-03-08 LAB — CBC
HCT: 30.9 % — ABNORMAL LOW (ref 39.0–52.0)
Hemoglobin: 9.5 g/dL — ABNORMAL LOW (ref 13.0–17.0)
MCH: 28.9 pg (ref 26.0–34.0)
MCHC: 30.7 g/dL (ref 30.0–36.0)
MCV: 93.9 fL (ref 80.0–100.0)
Platelets: 312 10*3/uL (ref 150–400)
RBC: 3.29 MIL/uL — ABNORMAL LOW (ref 4.22–5.81)
RDW: 16.2 % — ABNORMAL HIGH (ref 11.5–15.5)
WBC: 15.9 10*3/uL — ABNORMAL HIGH (ref 4.0–10.5)
nRBC: 0.6 % — ABNORMAL HIGH (ref 0.0–0.2)

## 2019-03-08 LAB — BPAM RBC
Blood Product Expiration Date: 202004042359
Blood Product Expiration Date: 202004042359
Blood Product Expiration Date: 202004052359
ISSUE DATE / TIME: 202003052213
Unit Type and Rh: 5100
Unit Type and Rh: 5100
Unit Type and Rh: 5100

## 2019-03-08 LAB — BASIC METABOLIC PANEL
Anion gap: 5 (ref 5–15)
BUN: 24 mg/dL — ABNORMAL HIGH (ref 6–20)
CO2: 20 mmol/L — ABNORMAL LOW (ref 22–32)
Calcium: 7.8 mg/dL — ABNORMAL LOW (ref 8.9–10.3)
Chloride: 116 mmol/L — ABNORMAL HIGH (ref 98–111)
Creatinine, Ser: 1.21 mg/dL (ref 0.61–1.24)
GFR calc Af Amer: >60 mL/min (ref >60)
GFR calc non Af Amer: >60 mL/min (ref >60)
Glucose, Bld: 109 mg/dL — ABNORMAL HIGH (ref 70–99)
Potassium: 4.1 mmol/L (ref 3.5–5.1)
Sodium: 141 mmol/L (ref 135–145)

## 2019-03-08 LAB — GLUCOSE, CAPILLARY
GLUCOSE-CAPILLARY: 112 mg/dL — AB (ref 70–99)
Glucose-Capillary: 94 mg/dL (ref 70–99)
Glucose-Capillary: 82 mg/dL (ref 70–99)
Glucose-Capillary: 88 mg/dL (ref 70–99)
Glucose-Capillary: 104 mg/dL — ABNORMAL HIGH (ref 70–99)
Glucose-Capillary: 109 mg/dL — ABNORMAL HIGH (ref 70–99)

## 2019-03-08 LAB — MAGNESIUM: Magnesium: 2 mg/dL (ref 1.7–2.4)

## 2019-03-08 LAB — TYPE AND SCREEN
ABO/RH(D): O POS
ANTIBODY SCREEN: NEGATIVE
Unit division: 0
Unit division: 0
Unit division: 0

## 2019-03-08 LAB — CK: Total CK: 462 U/L — ABNORMAL HIGH (ref 49–397)

## 2019-03-08 MED ORDER — QUETIAPINE FUMARATE 25 MG PO TABS
50.0000 mg | ORAL_TABLET | Freq: Two times a day (BID) | ORAL | Status: DC
  Administered 2019-03-08 – 2019-03-13 (×11): 50 mg
  Filled 2019-03-08 (×12): qty 2

## 2019-03-08 MED ORDER — CLONAZEPAM 0.5 MG PO TABS
0.5000 mg | ORAL_TABLET | Freq: Two times a day (BID) | ORAL | Status: DC
  Administered 2019-03-08 – 2019-03-13 (×11): 0.5 mg
  Filled 2019-03-08 (×12): qty 1

## 2019-03-08 MED ORDER — ALBUMIN HUMAN 5 % IV SOLN
25.0000 g | Freq: Once | INTRAVENOUS | Status: AC
  Administered 2019-03-08: 25 g via INTRAVENOUS
  Filled 2019-03-08: qty 500

## 2019-03-08 NOTE — Progress Notes (Signed)
Discussed foley catheter with Dr. Janee Morn. Patient is on max dose of propofol. Will try to wean propofol before removing catheter.

## 2019-03-08 NOTE — Progress Notes (Signed)
OT Cancellation Note  Patient Details Name: Oaklan Serven MRN: [id number] DOB: 05/31/1961   Cancelled Treatment:    Reason Eval/Treat Not Completed: (P) Patient not medically ready.  Pt remains intubated and sedated, with plans to return to OR today or tomorrow.  Will check back.  Jeani Hawking, OTR/L Acute Rehabilitation Services Pager [phone] Office [phone]   Jeani Hawking M 03/08/2019, 10:26 AM
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
## 2019-03-08 NOTE — Progress Notes (Addendum)
[facility] [place]  Subjective  - Intubated and sedated.   Objective (!) 165/80 97 98.5 F (36.9 C) (Axillary) 15 100%  Intake/Output Summary (Last 24 hours) at 03/08/2019 0817 Last data filed at 03/08/2019 0800 Gross per 24 hour  Intake 3624.17 ml  Output 2425 ml  Net 1199.17 ml    Palpable DP B LE, feet warm and well perfused to the toes. Right LE wound vac in place SS drainage 1325 cc out put.  Assessment/Planning: POD # 8  Procedure Performed: 1.  Exploration of right below-knee popliteal artery and vein and ligation of popliteal artery and vein 2.  Harvest the right greater saphenous vein 3.  Bypass right above-knee to below-knee popliteal artery with non-reversed greater saphenous vein 4.  Right lower extremity 4 compartment fasciotomy with negative pressure dressing placement  Patent arterial flow B LE Wound vac change right LE 03/05/2019  Will discuss plan for next wound vac change with Dr. Randie Heinz.  Mosetta Pigeon 03/08/2019 8:17 AM --  Laboratory Lab Results: Recent Labs    03/06/19 0615 03/08/19 0532  WBC 12.6* 15.9*  HGB 9.7* 9.5*  HCT 30.5* 30.9*  PLT 266 312   BMET Recent Labs    03/06/19 0615 03/08/19 0532  NA 142 141  K 3.9 4.1  CL 114* 116*  CO2 21* 20*  GLUCOSE 116* 109*  BUN 23* 24*  CREATININE 1.07 1.21  CALCIUM 7.7* 7.8*    COAG Lab Results  Component Value Date   INR 1.1 03/01/2019   INR 1.7 (H) 02/28/2019   INR 1.1 02/28/2019   No results found for: PTT  I have independently interviewed and examined patient and agree with PA assessment and plan above. Wound vac change at bedside tomorrow.   Londynn Sonoda C. Randie Heinz, MD [facility] [place] Office: [phone] Pager: [phone]
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
## 2019-03-08 NOTE — Progress Notes (Signed)
Bite block removed from right side and moved to left side.  RT will continue to monitor.

## 2019-03-08 NOTE — Progress Notes (Signed)
PT Cancellation Note  Patient Details Name: Travis Benson MRN: [id number] DOB: 1961-03-17   Cancelled Treatment:    Reason Eval/Treat Not Completed: Patient not medically ready. Pt remains intubated and sedated. Per RN pt planned to go back to OR either today or tomorrow. Acute PT to return as able as appropriate to complete PT eval.  Lewis Shock, PT, DPT Acute Rehabilitation Services Pager #: [phone] Office #: [phone]    Iona Hansen 03/08/2019, 9:46 AM

## 2019-03-09 ENCOUNTER — Inpatient Hospital Stay (HOSPITAL_COMMUNITY): Payer: Medicaid Other

## 2019-03-09 ENCOUNTER — Inpatient Hospital Stay: Payer: Self-pay

## 2019-03-09 DIAGNOSIS — M7989 Other specified soft tissue disorders: Secondary | ICD-10-CM

## 2019-03-09 LAB — BASIC METABOLIC PANEL
Anion gap: 7 (ref 5–15)
BUN: 27 mg/dL — ABNORMAL HIGH (ref 6–20)
CO2: 20 mmol/L — ABNORMAL LOW (ref 22–32)
Calcium: 8.4 mg/dL — ABNORMAL LOW (ref 8.9–10.3)
Chloride: 115 mmol/L — ABNORMAL HIGH (ref 98–111)
Creatinine, Ser: 1.15 mg/dL (ref 0.61–1.24)
GFR calc Af Amer: >60 mL/min (ref >60)
GFR calc non Af Amer: >60 mL/min (ref >60)
Glucose, Bld: 134 mg/dL — ABNORMAL HIGH (ref 70–99)
Potassium: 4.1 mmol/L (ref 3.5–5.1)
Sodium: 142 mmol/L (ref 135–145)

## 2019-03-09 LAB — GLUCOSE, CAPILLARY
GLUCOSE-CAPILLARY: 118 mg/dL — AB (ref 70–99)
GLUCOSE-CAPILLARY: 119 mg/dL — AB (ref 70–99)
Glucose-Capillary: 114 mg/dL — ABNORMAL HIGH (ref 70–99)
Glucose-Capillary: 128 mg/dL — ABNORMAL HIGH (ref 70–99)
Glucose-Capillary: 129 mg/dL — ABNORMAL HIGH (ref 70–99)
Glucose-Capillary: 129 mg/dL — ABNORMAL HIGH (ref 70–99)

## 2019-03-09 LAB — CBC
HCT: 29.2 % — ABNORMAL LOW (ref 39.0–52.0)
Hemoglobin: 8.8 g/dL — ABNORMAL LOW (ref 13.0–17.0)
MCH: 28.9 pg (ref 26.0–34.0)
MCHC: 30.1 g/dL (ref 30.0–36.0)
MCV: 95.7 fL (ref 80.0–100.0)
Platelets: 337 10*3/uL (ref 150–400)
RBC: 3.05 MIL/uL — ABNORMAL LOW (ref 4.22–5.81)
RDW: 16.2 % — ABNORMAL HIGH (ref 11.5–15.5)
WBC: 11.5 10*3/uL — ABNORMAL HIGH (ref 4.0–10.5)
nRBC: 0.4 % — ABNORMAL HIGH (ref 0.0–0.2)

## 2019-03-09 LAB — TRIGLYCERIDES: Triglycerides: 296 mg/dL — ABNORMAL HIGH (ref <150)

## 2019-03-09 MED ORDER — PANTOPRAZOLE SODIUM 40 MG PO PACK
40.0000 mg | PACK | Freq: Every day | ORAL | Status: DC
  Administered 2019-03-09 – 2019-03-18 (×10): 40 mg
  Filled 2019-03-09 (×10): qty 20

## 2019-03-09 MED ORDER — METOPROLOL TARTRATE 25 MG/10 ML ORAL SUSPENSION
25.0000 mg | Freq: Two times a day (BID) | ORAL | Status: DC
  Administered 2019-03-09 – 2019-03-11 (×5): 25 mg
  Filled 2019-03-09 (×5): qty 10

## 2019-03-09 MED ORDER — SODIUM CHLORIDE 0.9% FLUSH: 10.0000 mL | INTRAVENOUS | Status: DC | PRN

## 2019-03-09 MED ORDER — SODIUM CHLORIDE 0.9% FLUSH
10.0000 mL | Freq: Two times a day (BID) | INTRAVENOUS | Status: DC
  Administered 2019-03-09: 10 mL
  Administered 2019-03-10: 20 mL
  Administered 2019-03-11: 10 mL
  Administered 2019-03-11: 30 mL
  Administered 2019-03-12: 10 mL
  Administered 2019-03-12: 20 mL
  Administered 2019-03-13: 30 mL
  Administered 2019-03-13: 10 mL
  Administered 2019-03-14: 30 mL
  Administered 2019-03-15 – 2019-03-24 (×17): 10 mL
  Administered 2019-03-24: 20 mL
  Administered 2019-03-25 – 2019-03-28 (×8): 10 mL
  Administered 2019-03-29: 20 mL
  Administered 2019-03-29 – 2019-04-01 (×6): 10 mL
  Administered 2019-04-01: 10:00:00 20 mL
  Administered 2019-04-02 – 2019-04-28 (×37): 10 mL

## 2019-03-09 NOTE — Progress Notes (Signed)
Left lower extremity venous duplex exam completed. Please see preliminary notes on CV PROC under chart review.  Vidur Knust H Cristobal Advani(RDMS RVT) 03/09/19 1:36 PM
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
## 2019-03-09 NOTE — Progress Notes (Signed)
Oral airway - bite block moved from the left to right side of patient's mouth.  No complications.

## 2019-03-09 NOTE — Progress Notes (Signed)
Rehab Admissions Coordinator Note:  Patient was screened by Stephania Fragmin for appropriateness for an Inpatient Acute Rehab Consult.  At this time, pt on ventilator and not appropriate for CIR level therapies.  We will follow along for medical readiness.    Stephania Fragmin 03/09/2019, 3:10 PM  I can be reached at [phone].

## 2019-03-09 NOTE — Progress Notes (Signed)
Peripherally Inserted Central Catheter/Midline Placement  The IV Nurse has discussed with the patient and/or persons authorized to consent for the patient, the purpose of this procedure and the potential benefits and risks involved with this procedure.  The benefits include less needle sticks, lab draws from the catheter, and the patient may be discharged home with the catheter. Risks include, but not limited to, infection, bleeding, blood clot (thrombus formation), and puncture of an artery; nerve damage and irregular heartbeat and possibility to perform a PICC exchange if needed/ordered by physician.  Alternatives to this procedure were also discussed.  Bard Power PICC patient education guide, fact sheet on infection prevention and patient information card has been provided to patient /or left at bedside.    PICC/Midline Placement Documentation        Timmothy Sours 03/09/2019, 5:34 PM

## 2019-03-10 LAB — GLUCOSE, CAPILLARY
GLUCOSE-CAPILLARY: 129 mg/dL — AB (ref 70–99)
GLUCOSE-CAPILLARY: 108 mg/dL — AB (ref 70–99)
Glucose-Capillary: 115 mg/dL — ABNORMAL HIGH (ref 70–99)
Glucose-Capillary: 137 mg/dL — ABNORMAL HIGH (ref 70–99)
Glucose-Capillary: 118 mg/dL — ABNORMAL HIGH (ref 70–99)
Glucose-Capillary: 121 mg/dL — ABNORMAL HIGH (ref 70–99)

## 2019-03-10 LAB — BASIC METABOLIC PANEL
Anion gap: 4 — ABNORMAL LOW (ref 5–15)
BUN: 33 mg/dL — ABNORMAL HIGH (ref 6–20)
CO2: 25 mmol/L (ref 22–32)
Calcium: 8.7 mg/dL — ABNORMAL LOW (ref 8.9–10.3)
Chloride: 117 mmol/L — ABNORMAL HIGH (ref 98–111)
Creatinine, Ser: 1.11 mg/dL (ref 0.61–1.24)
GFR calc Af Amer: >60 mL/min (ref >60)
Glucose, Bld: 138 mg/dL — ABNORMAL HIGH (ref 70–99)
Potassium: 4.2 mmol/L (ref 3.5–5.1)
Sodium: 146 mmol/L — ABNORMAL HIGH (ref 135–145)

## 2019-03-10 LAB — CBC
HCT: 32.2 % — ABNORMAL LOW (ref 39.0–52.0)
Hemoglobin: 10 g/dL — ABNORMAL LOW (ref 13.0–17.0)
MCH: 29.9 pg (ref 26.0–34.0)
MCHC: 31.1 g/dL (ref 30.0–36.0)
MCV: 96.4 fL (ref 80.0–100.0)
Platelets: 411 10*3/uL — ABNORMAL HIGH (ref 150–400)
RBC: 3.34 MIL/uL — ABNORMAL LOW (ref 4.22–5.81)
RDW: 16.3 % — ABNORMAL HIGH (ref 11.5–15.5)
WBC: 15.3 10*3/uL — AB (ref 4.0–10.5)
nRBC: 0.2 % (ref 0.0–0.2)

## 2019-03-10 LAB — MYOGLOBIN, URINE: Myoglobin, Ur: 2 ng/mL (ref 0–13)

## 2019-03-10 MED ORDER — ACYCLOVIR 200 MG/5ML PO SUSP
800.0000 mg | Freq: Every day | ORAL | Status: DC
  Filled 2019-03-10 (×2): qty 20

## 2019-03-10 MED ORDER — NONFORMULARY OR COMPOUNDED ITEM
1000.0000 mg | Freq: Two times a day (BID) | Status: DC
  Administered 2019-03-10 – 2019-03-11 (×3): 1000 mg
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
## 2019-03-11 ENCOUNTER — Inpatient Hospital Stay (HOSPITAL_COMMUNITY): Payer: Medicaid Other

## 2019-03-11 LAB — GLUCOSE, CAPILLARY
Glucose-Capillary: 123 mg/dL — ABNORMAL HIGH (ref 70–99)
Glucose-Capillary: 122 mg/dL — ABNORMAL HIGH (ref 70–99)
Glucose-Capillary: 107 mg/dL — ABNORMAL HIGH (ref 70–99)
Glucose-Capillary: 114 mg/dL — ABNORMAL HIGH (ref 70–99)
Glucose-Capillary: 105 mg/dL — ABNORMAL HIGH (ref 70–99)

## 2019-03-11 LAB — BASIC METABOLIC PANEL
Anion gap: 4 — ABNORMAL LOW (ref 5–15)
BUN: 37 mg/dL — ABNORMAL HIGH (ref 6–20)
CO2: 24 mmol/L (ref 22–32)
Calcium: 8.3 mg/dL — ABNORMAL LOW (ref 8.9–10.3)
Chloride: 118 mmol/L — ABNORMAL HIGH (ref 98–111)
Creatinine, Ser: 0.99 mg/dL (ref 0.61–1.24)
GFR calc Af Amer: >60 mL/min (ref >60)
GFR calc non Af Amer: >60 mL/min (ref >60)
Glucose, Bld: 164 mg/dL — ABNORMAL HIGH (ref 70–99)
Potassium: 3.8 mmol/L (ref 3.5–5.1)
Sodium: 146 mmol/L — ABNORMAL HIGH (ref 135–145)

## 2019-03-11 LAB — CBC
HCT: 31.9 % — ABNORMAL LOW (ref 39.0–52.0)
Hemoglobin: 9.5 g/dL — ABNORMAL LOW (ref 13.0–17.0)
MCH: 29.1 pg (ref 26.0–34.0)
MCHC: 29.8 g/dL — ABNORMAL LOW (ref 30.0–36.0)
MCV: 97.6 fL (ref 80.0–100.0)
Platelets: 446 10*3/uL — ABNORMAL HIGH (ref 150–400)
RBC: 3.27 MIL/uL — ABNORMAL LOW (ref 4.22–5.81)
RDW: 16.3 % — ABNORMAL HIGH (ref 11.5–15.5)
WBC: 14.7 10*3/uL — ABNORMAL HIGH (ref 4.0–10.5)
nRBC: 0 % (ref 0.0–0.2)

## 2019-03-11 MED ORDER — NONFORMULARY OR COMPOUNDED ITEM
1000.0000 mg | Freq: Three times a day (TID) | Status: AC
  Administered 2019-03-11 – 2019-03-17 (×18): 1000 mg

## 2019-03-11 MED ORDER — METOPROLOL TARTRATE 25 MG/10 ML ORAL SUSPENSION
50.0000 mg | Freq: Two times a day (BID) | ORAL | Status: DC
  Administered 2019-03-11 – 2019-03-19 (×15): 50 mg
  Filled 2019-03-11 (×15): qty 20
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
## 2019-03-12 ENCOUNTER — Inpatient Hospital Stay (HOSPITAL_COMMUNITY): Payer: Medicaid Other

## 2019-03-12 LAB — BASIC METABOLIC PANEL
Anion gap: 6 (ref 5–15)
BUN: 40 mg/dL — ABNORMAL HIGH (ref 6–20)
CO2: 23 mmol/L (ref 22–32)
CREATININE: 1.09 mg/dL (ref 0.61–1.24)
Calcium: 8.6 mg/dL — ABNORMAL LOW (ref 8.9–10.3)
Chloride: 118 mmol/L — ABNORMAL HIGH (ref 98–111)
GFR calc Af Amer: >60 mL/min (ref >60)
GFR calc non Af Amer: >60 mL/min (ref >60)
Glucose, Bld: 155 mg/dL — ABNORMAL HIGH (ref 70–99)
Potassium: 4.1 mmol/L (ref 3.5–5.1)
Sodium: 147 mmol/L — ABNORMAL HIGH (ref 135–145)

## 2019-03-12 LAB — CBC
HCT: 33.1 % — ABNORMAL LOW (ref 39.0–52.0)
Hemoglobin: 10 g/dL — ABNORMAL LOW (ref 13.0–17.0)
MCH: 29.2 pg (ref 26.0–34.0)
MCHC: 30.2 g/dL (ref 30.0–36.0)
MCV: 96.5 fL (ref 80.0–100.0)
Platelets: 513 10*3/uL — ABNORMAL HIGH (ref 150–400)
RBC: 3.43 MIL/uL — ABNORMAL LOW (ref 4.22–5.81)
RDW: 16.5 % — AB (ref 11.5–15.5)
WBC: 19 10*3/uL — ABNORMAL HIGH (ref 4.0–10.5)
nRBC: 0 % (ref 0.0–0.2)

## 2019-03-12 LAB — TRIGLYCERIDES: Triglycerides: 210 mg/dL — ABNORMAL HIGH (ref <150)

## 2019-03-12 LAB — GLUCOSE, CAPILLARY
Glucose-Capillary: 101 mg/dL — ABNORMAL HIGH (ref 70–99)
Glucose-Capillary: 141 mg/dL — ABNORMAL HIGH (ref 70–99)

## 2019-03-12 MED ORDER — FREE WATER
200.0000 mL | Freq: Three times a day (TID) | Status: DC
  Administered 2019-03-12 – 2019-03-20 (×24): 200 mL
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
## 2019-03-12 NOTE — Progress Notes (Signed)
The patient is in the CT scanner.  I am told he has been slow to arouse from his long-term sedation.  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  This is not big enough to evacuate.  I suggest repeating his CAT scan in a few days.  I discussed this with Dr. Janee Morn.
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
## 2019-03-13 ENCOUNTER — Inpatient Hospital Stay (HOSPITAL_COMMUNITY): Payer: Medicaid Other

## 2019-03-13 LAB — GLUCOSE, CAPILLARY
GLUCOSE-CAPILLARY: 144 mg/dL — AB (ref 70–99)
Glucose-Capillary: 140 mg/dL — ABNORMAL HIGH (ref 70–99)
Glucose-Capillary: 131 mg/dL — ABNORMAL HIGH (ref 70–99)
Glucose-Capillary: 141 mg/dL — ABNORMAL HIGH (ref 70–99)
Glucose-Capillary: 125 mg/dL — ABNORMAL HIGH (ref 70–99)

## 2019-03-13 LAB — BASIC METABOLIC PANEL
Anion gap: 7 (ref 5–15)
BUN: 44 mg/dL — ABNORMAL HIGH (ref 6–20)
CALCIUM: 8.3 mg/dL — AB (ref 8.9–10.3)
CO2: 21 mmol/L — ABNORMAL LOW (ref 22–32)
Chloride: 116 mmol/L — ABNORMAL HIGH (ref 98–111)
Creatinine, Ser: 1.17 mg/dL (ref 0.61–1.24)
GFR calc Af Amer: >60 mL/min (ref >60)
Glucose, Bld: 142 mg/dL — ABNORMAL HIGH (ref 70–99)
Potassium: 4 mmol/L (ref 3.5–5.1)
Sodium: 144 mmol/L (ref 135–145)

## 2019-03-13 LAB — CBC
HCT: 30.6 % — ABNORMAL LOW (ref 39.0–52.0)
Hemoglobin: 9.1 g/dL — ABNORMAL LOW (ref 13.0–17.0)
MCH: 29 pg (ref 26.0–34.0)
MCHC: 29.7 g/dL — ABNORMAL LOW (ref 30.0–36.0)
MCV: 97.5 fL (ref 80.0–100.0)
Platelets: 522 10*3/uL — ABNORMAL HIGH (ref 150–400)
RBC: 3.14 MIL/uL — ABNORMAL LOW (ref 4.22–5.81)
RDW: 16.4 % — AB (ref 11.5–15.5)
WBC: 17.7 10*3/uL — ABNORMAL HIGH (ref 4.0–10.5)
nRBC: 0 % (ref 0.0–0.2)
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
## 2019-03-14 LAB — GLUCOSE, CAPILLARY
GLUCOSE-CAPILLARY: 118 mg/dL — AB (ref 70–99)
Glucose-Capillary: 111 mg/dL — ABNORMAL HIGH (ref 70–99)
Glucose-Capillary: 114 mg/dL — ABNORMAL HIGH (ref 70–99)
Glucose-Capillary: 115 mg/dL — ABNORMAL HIGH (ref 70–99)
Glucose-Capillary: 121 mg/dL — ABNORMAL HIGH (ref 70–99)

## 2019-03-14 MED ORDER — DEXMEDETOMIDINE HCL IN NACL 400 MCG/100ML IV SOLN
0.4000 ug/kg/h | INTRAVENOUS | Status: DC
  Administered 2019-03-14: 1.2 ug/kg/h via INTRAVENOUS
  Administered 2019-03-14: 0.4 ug/kg/h via INTRAVENOUS
  Administered 2019-03-15 (×2): 1.2 ug/kg/h via INTRAVENOUS
  Administered 2019-03-15: 1 ug/kg/h via INTRAVENOUS
  Administered 2019-03-15 (×3): 1.2 ug/kg/h via INTRAVENOUS
  Administered 2019-03-15: 1 ug/kg/h via INTRAVENOUS
  Administered 2019-03-16: 0.6 ug/kg/h via INTRAVENOUS
  Administered 2019-03-16: 1 ug/kg/h via INTRAVENOUS
  Administered 2019-03-16: 0.6 ug/kg/h via INTRAVENOUS
  Administered 2019-03-16 (×3): 1.2 ug/kg/h via INTRAVENOUS
  Administered 2019-03-16: 1 ug/kg/h via INTRAVENOUS
  Administered 2019-03-17: 0.5 ug/kg/h via INTRAVENOUS
  Administered 2019-03-17: 0.7 ug/kg/h via INTRAVENOUS
  Administered 2019-03-17: 0.8 ug/kg/h via INTRAVENOUS
  Administered 2019-03-17: 0.5 ug/kg/h via INTRAVENOUS
  Administered 2019-03-18 (×2): 0.6 ug/kg/h via INTRAVENOUS
  Filled 2019-03-14 (×5): qty 100
  Filled 2019-03-14: qty 200
  Filled 2019-03-14: qty 100
  Filled 2019-03-14 (×2): qty 200
  Filled 2019-03-14 (×2): qty 100
  Filled 2019-03-14: qty 200
  Filled 2019-03-14 (×7): qty 100

## 2019-03-14 NOTE — Progress Notes (Signed)
Trauma MD order to attempt to change sedation to Precedex.

## 2019-03-14 NOTE — Progress Notes (Signed)
Vascular MD made aware of difficulties with wound vac reading Blockage alert and failing to pull suction properly. No new orders. Will address on rounds.

## 2019-03-14 NOTE — Progress Notes (Signed)
Due to pt neuro exam discussed with Trauma MD to hold Seroquel on Klonopin. Rounding team to review in AM.

## 2019-03-14 NOTE — Progress Notes (Signed)
Patient ID: Travis Benson, male   DOB: December 12, 1961, 58 y.o.   MRN: [id number] Patient was having malfunctioning of his VAC.  VAC changed at the bedside.  Medial and lateral muscle groups are all viable.
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
## 2019-03-15 ENCOUNTER — Inpatient Hospital Stay (HOSPITAL_COMMUNITY): Payer: Medicaid Other

## 2019-03-15 LAB — GLUCOSE, CAPILLARY
GLUCOSE-CAPILLARY: 127 mg/dL — AB (ref 70–99)
Glucose-Capillary: 106 mg/dL — ABNORMAL HIGH (ref 70–99)
Glucose-Capillary: 116 mg/dL — ABNORMAL HIGH (ref 70–99)
Glucose-Capillary: 134 mg/dL — ABNORMAL HIGH (ref 70–99)

## 2019-03-15 LAB — CBC WITH DIFFERENTIAL/PLATELET
ABS IMMATURE GRANULOCYTES: 0.05 10*3/uL (ref 0.00–0.07)
Basophils Absolute: 0 10*3/uL (ref 0.0–0.1)
Basophils Relative: 0 %
Eosinophils Absolute: 0.2 10*3/uL (ref 0.0–0.5)
Eosinophils Relative: 2 %
HCT: 26.8 % — ABNORMAL LOW (ref 39.0–52.0)
Hemoglobin: 8.2 g/dL — ABNORMAL LOW (ref 13.0–17.0)
Immature Granulocytes: 0 %
LYMPHS ABS: 1 10*3/uL (ref 0.7–4.0)
LYMPHS PCT: 7 %
MCH: 29.9 pg (ref 26.0–34.0)
MCHC: 30.6 g/dL (ref 30.0–36.0)
MCV: 97.8 fL (ref 80.0–100.0)
Monocytes Absolute: 1.4 10*3/uL — ABNORMAL HIGH (ref 0.1–1.0)
Monocytes Relative: 11 %
Neutro Abs: 10.5 10*3/uL — ABNORMAL HIGH (ref 1.7–7.7)
Neutrophils Relative %: 80 %
Platelets: 551 10*3/uL — ABNORMAL HIGH (ref 150–400)
RBC: 2.74 MIL/uL — ABNORMAL LOW (ref 4.22–5.81)
RDW: 16 % — ABNORMAL HIGH (ref 11.5–15.5)
WBC: 13.1 10*3/uL — ABNORMAL HIGH (ref 4.0–10.5)
nRBC: 0 % (ref 0.0–0.2)

## 2019-03-15 LAB — BASIC METABOLIC PANEL
Anion gap: 8 (ref 5–15)
BUN: 45 mg/dL — ABNORMAL HIGH (ref 6–20)
CO2: 21 mmol/L — ABNORMAL LOW (ref 22–32)
Calcium: 8.4 mg/dL — ABNORMAL LOW (ref 8.9–10.3)
Chloride: 118 mmol/L — ABNORMAL HIGH (ref 98–111)
Creatinine, Ser: 1.15 mg/dL (ref 0.61–1.24)
GFR calc Af Amer: >60 mL/min (ref >60)
GFR calc non Af Amer: >60 mL/min (ref >60)
GLUCOSE: 137 mg/dL — AB (ref 70–99)
POTASSIUM: 4 mmol/L (ref 3.5–5.1)
Sodium: 147 mmol/L — ABNORMAL HIGH (ref 135–145)

## 2019-03-15 MED ORDER — POLYETHYLENE GLYCOL 3350 17 G PO PACK
17.0000 g | PACK | Freq: Every day | ORAL | Status: DC | PRN
  Administered 2019-03-30: 17 g
  Filled 2019-03-15: qty 1

## 2019-03-15 MED ORDER — FENTANYL CITRATE (PF) 100 MCG/2ML IJ SOLN
50.0000 ug | INTRAMUSCULAR | Status: DC | PRN
  Administered 2019-03-15 – 2019-03-24 (×23): 100 ug via INTRAVENOUS
  Administered 2019-03-25: 50 ug via INTRAVENOUS
  Administered 2019-03-26: 100 ug via INTRAVENOUS
  Administered 2019-04-03: 50 ug via INTRAVENOUS
  Administered 2019-04-13 (×2): 100 ug via INTRAVENOUS
  Filled 2019-03-15 (×28): qty 2

## 2019-03-15 MED ORDER — FENTANYL CITRATE (PF) 2500 MCG/50ML IJ SOLN
25.0000 ug/h | Status: DC
  Filled 2019-03-15: qty 100

## 2019-03-15 MED ORDER — ACETAMINOPHEN 325 MG PO TABS
650.0000 mg | ORAL_TABLET | ORAL | Status: DC | PRN
  Administered 2019-03-16 – 2019-03-30 (×12): 650 mg
  Filled 2019-03-15 (×11): qty 2

## 2019-03-15 MED ORDER — BACITRACIN-POLYMYXIN B 500-10000 UNIT/GM OP OINT
TOPICAL_OINTMENT | Freq: Two times a day (BID) | OPHTHALMIC | Status: DC
  Administered 2019-03-15 – 2019-03-17 (×4): via OPHTHALMIC
  Administered 2019-03-17 – 2019-03-18 (×2): 1 via OPHTHALMIC
  Administered 2019-03-18: 22:00:00 via OPHTHALMIC
  Administered 2019-03-19 (×2): 1 via OPHTHALMIC
  Administered 2019-03-20 – 2019-03-23 (×8): via OPHTHALMIC
  Administered 2019-03-24: 1 via OPHTHALMIC
  Administered 2019-03-24 – 2019-03-26 (×4): via OPHTHALMIC
  Administered 2019-03-26: 1 via OPHTHALMIC
  Administered 2019-03-27 – 2019-03-28 (×3): via OPHTHALMIC
  Administered 2019-03-28: 1 via OPHTHALMIC
  Administered 2019-03-29 – 2019-04-07 (×18): via OPHTHALMIC
  Filled 2019-03-15 (×2): qty 3.5

## 2019-03-15 MED ORDER — FUROSEMIDE 10 MG/ML IJ SOLN
40.0000 mg | Freq: Once | INTRAMUSCULAR | Status: AC
  Administered 2019-03-15: 40 mg via INTRAVENOUS
  Filled 2019-03-15: qty 4

## 2019-03-15 NOTE — Progress Notes (Signed)
Patient transported to CT and back without complications. RN at bedside.  
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
## 2019-03-15 NOTE — Progress Notes (Signed)
Patient ID: Travis Benson, male   DOB: [birth year], 58 y.o.   MRN: [id number] I met with his wife, Manuela Schwartz, at the bedside at length and updated her on the plan of care. We discussed the possibility of trach if we cannot get him extubated over the next couple days. I answered her questions.  Patient examined and I agree with the assessment and plan  Georganna Skeans, MD, MPH, FACS Trauma: [phone] General Surgery: [phone]  03/15/2019 3:29 PM

## 2019-03-16 ENCOUNTER — Inpatient Hospital Stay (HOSPITAL_COMMUNITY): Payer: Medicaid Other

## 2019-03-16 LAB — BASIC METABOLIC PANEL
Anion gap: 5 (ref 5–15)
BUN: 45 mg/dL — ABNORMAL HIGH (ref 6–20)
CALCIUM: 8 mg/dL — AB (ref 8.9–10.3)
CO2: 23 mmol/L (ref 22–32)
Chloride: 120 mmol/L — ABNORMAL HIGH (ref 98–111)
Creatinine, Ser: 1.17 mg/dL (ref 0.61–1.24)
GFR calc Af Amer: >60 mL/min (ref >60)
Glucose, Bld: 157 mg/dL — ABNORMAL HIGH (ref 70–99)
Potassium: 3.4 mmol/L — ABNORMAL LOW (ref 3.5–5.1)
Sodium: 148 mmol/L — ABNORMAL HIGH (ref 135–145)

## 2019-03-16 LAB — GLUCOSE, CAPILLARY
GLUCOSE-CAPILLARY: 115 mg/dL — AB (ref 70–99)
Glucose-Capillary: 145 mg/dL — ABNORMAL HIGH (ref 70–99)
Glucose-Capillary: 131 mg/dL — ABNORMAL HIGH (ref 70–99)
Glucose-Capillary: 126 mg/dL — ABNORMAL HIGH (ref 70–99)
Glucose-Capillary: 134 mg/dL — ABNORMAL HIGH (ref 70–99)
Glucose-Capillary: 139 mg/dL — ABNORMAL HIGH (ref 70–99)

## 2019-03-16 LAB — CBC
HCT: 27.2 % — ABNORMAL LOW (ref 39.0–52.0)
Hemoglobin: 8.3 g/dL — ABNORMAL LOW (ref 13.0–17.0)
MCH: 30.2 pg (ref 26.0–34.0)
MCHC: 30.5 g/dL (ref 30.0–36.0)
MCV: 98.9 fL (ref 80.0–100.0)
PLATELETS: 557 10*3/uL — AB (ref 150–400)
RBC: 2.75 MIL/uL — ABNORMAL LOW (ref 4.22–5.81)
RDW: 16.1 % — ABNORMAL HIGH (ref 11.5–15.5)
WBC: 11.8 10*3/uL — ABNORMAL HIGH (ref 4.0–10.5)
nRBC: 0 % (ref 0.0–0.2)

## 2019-03-16 MED ORDER — OXYCODONE HCL 5 MG/5ML PO SOLN
10.0000 mg | ORAL | Status: DC | PRN
  Administered 2019-03-16 – 2019-03-31 (×14): 10 mg
  Filled 2019-03-16 (×14): qty 10

## 2019-03-16 MED ORDER — PRO-STAT SUGAR FREE PO LIQD
30.0000 mL | Freq: Two times a day (BID) | ORAL | Status: DC
  Administered 2019-03-17 – 2019-03-18 (×3): 30 mL
  Filled 2019-03-16 (×3): qty 30

## 2019-03-16 MED ORDER — PIVOT 1.5 CAL PO LIQD
1000.0000 mL | ORAL | Status: DC
  Administered 2019-03-16 – 2019-03-17 (×2): 1000 mL

## 2019-03-16 MED ORDER — PROPOFOL 1000 MG/100ML IV EMUL
5.0000 ug/kg/min | INTRAVENOUS | Status: DC
  Administered 2019-03-16: 10 ug/kg/min via INTRAVENOUS
  Administered 2019-03-16: 5 ug/kg/min via INTRAVENOUS
  Administered 2019-03-17: 10 ug/kg/min via INTRAVENOUS
  Administered 2019-03-17: 15 ug/kg/min via INTRAVENOUS
  Administered 2019-03-18: 12 ug/kg/min via INTRAVENOUS
  Filled 2019-03-16 (×6): qty 100

## 2019-03-16 NOTE — Progress Notes (Signed)
Subjective: The patient is intubated and sedated on Precedex.  By the nurses report he will move his upper extremities but no one has noticed any lower extremity movement yet.  Objective: Vital signs in last 24 hours: Temp:  [99.5 F (37.5 C)-100.5 F (38.1 C)] 100.5 F (38.1 C) (03/16 1600) Pulse Rate:  [58-92] 70 (03/17 0748) Resp:  [18-27] 23 (03/17 0748) BP: (95-140)/(46-68) 124/68 (03/17 0748) SpO2:  [98 %-100 %] 99 % (03/17 0749) FiO2 (%):  [40 %] 40 % (03/17 0749) Weight:  [115.3 kg] 115.3 kg (03/17 0500) Estimated body mass index is 34.47 kg/m as calculated from the following:   Height as of this encounter: 6' (1.829 m).   Weight as of this encounter: 115.3 kg.   Intake/Output from previous day: 03/16 0701 - 03/17 0700 In: 2065.7 [I.V.:1525.7; NG/GT:540] Out: 3560 [Urine:3085; Drains:475] Intake/Output this shift: No intake/output data recorded.  Physical exam heavily sedated with Precedex.  As above  I reviewed the patient's head CT performed yesterday.  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  Lab Results: Recent Labs    03/15/19 0604 03/16/19 0651  WBC 13.1* 11.8*  HGB 8.2* 8.3*  HCT 26.8* 27.2*  PLT 551* 557*   BMET Recent Labs    03/15/19 0604 03/16/19 0651  NA 147* 148*  K 4.0 3.4*  CL 118* 120*  CO2 21* 23  GLUCOSE 137* 157*  BUN 45* 45*  CREATININE 1.15 1.17  CALCIUM 8.4* 8.0*    Studies/Results: Ct Head Wo Contrast  Result Date: 03/15/2019 CLINICAL DATA:  Left subdural hematoma/hygroma.  Follow-up. EXAM: CT HEAD WITHOUT CONTRAST TECHNIQUE: Contiguous axial images were obtained from the base of the skull through the vertex without intravenous contrast. COMPARISON:  03/12/2019.  02/28/2019. FINDINGS: Brain: There is no longer any hyperdense component of the left subdural collection. 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 Left frontal  component now measures up to 12 mm, previously 10 mm. Left-to-right shift persists, 3-4 mm. No evidence of intraparenchymal brain injury. No sign of infarction, mass or hydrocephalus. Vascular: No abnormal vascular finding. Skull: No visible skull fracture. No visible skull base or temporal bone fracture. Sinuses/Orbits: Fluid opacification of the sphenoid sinus. In the setting of trauma, this is worrisome for skull base fracture but with careful with view of this exam in prior exams I do not see that. Additionally, bilateral mastoid and middle ear fluid opacification is worsened, again without evidence of temporal bone fracture. Other: None IMPRESSION: Resolution of hyperdense subdural blood. Slight enlargement of the subdural hygroma on the left, now up to 12 mm in thickness. Previous maximal measurement was 10 mm. Stable hygroma on the right, maximal thickness 6 mm. Left-to-right shift unchanged at 3 to 4 mm. Fluid opacification of the sphenoid sinus, mastoids and middle ears. In the setting of trauma, this is worrisome for fractures of the skull base or temporal bones, but I do not find that on review of the imaging. Electronically Signed   By: Paulina Fusi M.D.   On: 03/15/2019 11:30   Dg Chest Port 1 View  Result Date: 03/15/2019 CLINICAL DATA:  58 year old male post trauma.  Subsequent encounter. EXAM: PORTABLE CHEST 1 VIEW COMPARISON:  03/11/2019 chest x-ray. FINDINGS: Right PICC line in place. The tip is difficult adequately assess secondary to overlying leads and nasogastric tube and appears to be in the region of the right atrium. This can be assessed  on follow-up with change in cardiac lead position. Nasogastric tube curled within the gastric fundus. Endotracheal tube tip 6 cm above the carina. Minimal basilar/perihilar subsegmental atelectasis. No pulmonary edema or obvious pneumothorax. Heart size within normal limits. IMPRESSION: 1. Right PICC line in place. The tip is difficult adequately assess  secondary to overlying leads and appears to be in the region of the right atrium. This can be assessed on follow-up with change in cardiac lead position. 2. Minimal basilar/perihilar subsegmental atelectasis. These results will be called to the ordering clinician or representative by the Radiologist Assistant, and communication documented in the PACS or zVision Dashboard. Electronically Signed   By: Lacy Duverney M.D.   On: 03/15/2019 07:42    Assessment/Plan: Left subdural hematoma/hygroma: I will plan to repeat his CAT scan in a few days.  I do not think this needs to be evacuated presently.  Thoracic fractures: At some point, when he is more clinically stable, we may need to get a thoracic MRI, but I suspect his lack of lower extremity motion is secondary to his severe orthopedic trauma.  LOS: 16 days     Cristi Loron 03/16/2019, 8:03 AM
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
## 2019-03-17 ENCOUNTER — Inpatient Hospital Stay (HOSPITAL_COMMUNITY): Payer: Medicaid Other

## 2019-03-17 LAB — TRIGLYCERIDES: Triglycerides: 455 mg/dL — ABNORMAL HIGH (ref <150)

## 2019-03-17 LAB — GLUCOSE, CAPILLARY
Glucose-Capillary: 124 mg/dL — ABNORMAL HIGH (ref 70–99)
Glucose-Capillary: 139 mg/dL — ABNORMAL HIGH (ref 70–99)
Glucose-Capillary: 128 mg/dL — ABNORMAL HIGH (ref 70–99)
Glucose-Capillary: 123 mg/dL — ABNORMAL HIGH (ref 70–99)
Glucose-Capillary: 132 mg/dL — ABNORMAL HIGH (ref 70–99)
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
## 2019-03-17 NOTE — Procedures (Signed)
Cortrak  Person Inserting Tube:  Govind Furey C, RD Tube Type:  Cortrak - 43 inches Tube Location:  Left nare Initial Placement:  Postpyloric Secured by: Bridle Technique Used to Measure Tube Placement:  Documented cm marking at nare/ corner of mouth Cortrak Secured At:  79 cm    Cortrak Tube Team Note:  Consult received to place a Cortrak feeding tube.   No x-ray is required. RN may begin using tube.   If the tube becomes dislodged please keep the tube and contact the Cortrak team at [email or web address] ([email or web address]) for replacement.  If after hours and replacement cannot be delayed, place a NG tube and confirm placement with an abdominal x-ray.    Davison Ohms RD, LDN, CNSC [phone] Pager [phone] After Hours Pager   
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
## 2019-03-17 NOTE — Progress Notes (Signed)
Subjective: The patient remains intubated and sedated.  Objective: Vital signs in last 24 hours: Temp:  [99.4 F (37.4 C)-100.5 F (38.1 C)] 99.5 F (37.5 C) (03/18 0400) Pulse Rate:  [69-99] 92 (03/18 1200) Resp:  [18-29] 27 (03/18 1200) BP: (78-145)/(46-120) 126/68 (03/18 1200) SpO2:  [97 %-100 %] 99 % (03/18 1200) FiO2 (%):  [40 %] 40 % (03/18 1200) Weight:  [113.5 kg] 113.5 kg (03/18 0500) Estimated body mass index is 33.94 kg/m as calculated from the following:   Height as of this encounter: 6' (1.829 m).   Weight as of this encounter: 113.5 kg.   Intake/Output from previous day: 03/17 0701 - 03/18 0700 In: 2336.3 [I.V.:949.6; NG/GT:1386.7] Out: 2650 [Urine:900; Emesis/NG output:1075; Drains:675] Intake/Output this shift: Total I/O In: 554.9 [I.V.:174.9; NG/GT:380] Out: 400 [Drains:400]  Physical exam by report the patient will move his left foot.  Lab Results: Recent Labs    03/15/19 0604 03/16/19 0651  WBC 13.1* 11.8*  HGB 8.2* 8.3*  HCT 26.8* 27.2*  PLT 551* 557*   BMET Recent Labs    03/15/19 0604 03/16/19 0651  NA 147* 148*  K 4.0 3.4*  CL 118* 120*  CO2 21* 23  GLUCOSE 137* 157*  BUN 45* 45*  CREATININE 1.15 1.17  CALCIUM 8.4* 8.0*    Studies/Results: Dg Chest Port 1 View  Result Date: 03/17/2019 CLINICAL DATA:  Shortness of breath EXAM: PORTABLE CHEST 1 VIEW COMPARISON:  03/16/2019 FINDINGS: Cardiac shadow is stable. Endotracheal tube is noted in satisfactory position. The gastric catheter is coiled within the stomach. The lungs are well aerated bilaterally. No focal infiltrate or sizable effusion is seen. No bony abnormality is noted. IMPRESSION: No acute abnormality seen. Tubes and lines as described. Electronically Signed   By: Alcide Clever M.D.   On: 03/17/2019 09:41   Dg Chest Port 1 View  Result Date: 03/16/2019 CLINICAL DATA:  Intubation EXAM: PORTABLE CHEST 1 VIEW COMPARISON:  Portable exam 1000 hours compared to 03/15/2019 FINDINGS:  Tip of endotracheal tube projects 5.7 cm above carina. Orogastric tube extends to stomach. Normal heart size, mediastinal contours, and pulmonary vascularity. Scattered subsegmental atelectasis RIGHT perihilar. No acute infiltrate, pleural effusion or pneumothorax. Bones unremarkable. IMPRESSION: Subsegmental atelectasis RIGHT perihilar. Electronically Signed   By: Ulyses Southward M.D.   On: 03/16/2019 10:28    Assessment/Plan: Spinal fractures: This should heal in an orthosis.  Left hygroma versus subdural hematoma: I will plan to repeat his CAT scan next week.  LOS: 17 days     Cristi Loron 03/17/2019, 1:02 PM
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
## 2019-03-18 LAB — BASIC METABOLIC PANEL
Anion gap: 4 — ABNORMAL LOW (ref 5–15)
BUN: 34 mg/dL — ABNORMAL HIGH (ref 6–20)
CALCIUM: 8.3 mg/dL — AB (ref 8.9–10.3)
CO2: 26 mmol/L (ref 22–32)
Chloride: 120 mmol/L — ABNORMAL HIGH (ref 98–111)
Creatinine, Ser: 0.99 mg/dL (ref 0.61–1.24)
GFR calc Af Amer: >60 mL/min (ref >60)
GFR calc non Af Amer: >60 mL/min (ref >60)
Glucose, Bld: 145 mg/dL — ABNORMAL HIGH (ref 70–99)
Potassium: 3.7 mmol/L (ref 3.5–5.1)
SODIUM: 150 mmol/L — AB (ref 135–145)

## 2019-03-18 LAB — GLUCOSE, CAPILLARY
Glucose-Capillary: 101 mg/dL — ABNORMAL HIGH (ref 70–99)
Glucose-Capillary: 105 mg/dL — ABNORMAL HIGH (ref 70–99)
Glucose-Capillary: 115 mg/dL — ABNORMAL HIGH (ref 70–99)
Glucose-Capillary: 123 mg/dL — ABNORMAL HIGH (ref 70–99)
Glucose-Capillary: 125 mg/dL — ABNORMAL HIGH (ref 70–99)
Glucose-Capillary: 128 mg/dL — ABNORMAL HIGH (ref 70–99)
Glucose-Capillary: 131 mg/dL — ABNORMAL HIGH (ref 70–99)

## 2019-03-18 LAB — TRIGLYCERIDES: Triglycerides: 372 mg/dL — ABNORMAL HIGH (ref <150)

## 2019-03-18 MED ORDER — ALBUTEROL SULFATE HFA 108 (90 BASE) MCG/ACT IN AERS
1.0000 | INHALATION_SPRAY | RESPIRATORY_TRACT | Status: DC | PRN
  Filled 2019-03-18: qty 6.7

## 2019-03-18 MED ORDER — PIVOT 1.5 CAL PO LIQD
1000.0000 mL | ORAL | Status: DC
  Administered 2019-03-18 – 2019-03-31 (×11): 1000 mL
  Filled 2019-03-18 (×20): qty 1000

## 2019-03-18 NOTE — Progress Notes (Signed)
Patient ID: Travis Benson, male   DOB: Feb 22, 1961, 58 y.o.   MRN: [id number] Extubated and doing well so far. Speaking some. ST eval. I spoke with his wife.  Violeta Gelinas, MD, MPH, FACS Trauma: [phone] General Surgery: [phone]

## 2019-03-18 NOTE — Procedures (Signed)
Extubation Procedure Note  Patient Details:   Name: Travis Benson DOB: May 09, 1961 MRN: [id number]   Airway Documentation:    Vent end date: 03/18/19 Vent end time: 0934   Evaluation  O2 sats: stable throughout Complications: No apparent complications Patient did tolerate procedure well. Bilateral Breath Sounds: Clear, Diminished   No   RT extubated patient to 3L [place] with RN at beside per MD order. Positive cuff leak noted. No stridor noted. Patient sating 100% and seems comfortable, no increase WOB. RT will continue to monitor as needed.   Lura Em 03/18/2019, 9:39 AM
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
## 2019-03-19 ENCOUNTER — Inpatient Hospital Stay (HOSPITAL_COMMUNITY): Payer: Medicaid Other

## 2019-03-19 LAB — CBC
HCT: 28.9 % — ABNORMAL LOW (ref 39.0–52.0)
Hemoglobin: 8.5 g/dL — ABNORMAL LOW (ref 13.0–17.0)
MCH: 29.2 pg (ref 26.0–34.0)
MCHC: 29.4 g/dL — ABNORMAL LOW (ref 30.0–36.0)
MCV: 99.3 fL (ref 80.0–100.0)
Platelets: 744 10*3/uL — ABNORMAL HIGH (ref 150–400)
RBC: 2.91 MIL/uL — ABNORMAL LOW (ref 4.22–5.81)
RDW: 16 % — AB (ref 11.5–15.5)
WBC: 9.5 10*3/uL (ref 4.0–10.5)
nRBC: 0 % (ref 0.0–0.2)

## 2019-03-19 LAB — BASIC METABOLIC PANEL
Anion gap: 7 (ref 5–15)
BUN: 29 mg/dL — AB (ref 6–20)
CO2: 26 mmol/L (ref 22–32)
Calcium: 8.2 mg/dL — ABNORMAL LOW (ref 8.9–10.3)
Chloride: 118 mmol/L — ABNORMAL HIGH (ref 98–111)
Creatinine, Ser: 1 mg/dL (ref 0.61–1.24)
GFR calc Af Amer: >60 mL/min (ref >60)
GFR calc non Af Amer: >60 mL/min (ref >60)
Glucose, Bld: 123 mg/dL — ABNORMAL HIGH (ref 70–99)
Potassium: 3.4 mmol/L — ABNORMAL LOW (ref 3.5–5.1)
Sodium: 151 mmol/L — ABNORMAL HIGH (ref 135–145)

## 2019-03-19 LAB — POCT I-STAT 7, (LYTES, BLD GAS, ICA,H+H)
Acid-Base Excess: 4 mmol/L — ABNORMAL HIGH (ref 0.0–2.0)
BICARBONATE: 28.6 mmol/L — AB (ref 20.0–28.0)
Calcium, Ion: 1.2 mmol/L (ref 1.15–1.40)
HEMATOCRIT: 28 % — AB (ref 39.0–52.0)
Hemoglobin: 9.5 g/dL — ABNORMAL LOW (ref 13.0–17.0)
O2 Saturation: 100 %
Patient temperature: 101.8
Potassium: 3.8 mmol/L (ref 3.5–5.1)
Sodium: 152 mmol/L — ABNORMAL HIGH (ref 135–145)
TCO2: 30 mmol/L (ref 22–32)
pCO2 arterial: 44.4 mmHg (ref 32.0–48.0)
pH, Arterial: 7.424 (ref 7.350–7.450)
pO2, Arterial: 174 mmHg — ABNORMAL HIGH (ref 83.0–108.0)

## 2019-03-19 LAB — GLUCOSE, CAPILLARY
GLUCOSE-CAPILLARY: 123 mg/dL — AB (ref 70–99)
Glucose-Capillary: 125 mg/dL — ABNORMAL HIGH (ref 70–99)
Glucose-Capillary: 118 mg/dL — ABNORMAL HIGH (ref 70–99)
Glucose-Capillary: 146 mg/dL — ABNORMAL HIGH (ref 70–99)
Glucose-Capillary: 111 mg/dL — ABNORMAL HIGH (ref 70–99)

## 2019-03-19 MED ORDER — METOPROLOL TARTRATE 5 MG/5ML IV SOLN
INTRAVENOUS | Status: AC
  Filled 2019-03-19: qty 10

## 2019-03-19 MED ORDER — METOPROLOL TARTRATE 25 MG/10 ML ORAL SUSPENSION
100.0000 mg | Freq: Two times a day (BID) | ORAL | Status: DC
  Administered 2019-03-19 – 2019-03-29 (×21): 100 mg
  Filled 2019-03-19 (×23): qty 40

## 2019-03-19 MED ORDER — METOPROLOL TARTRATE 5 MG/5ML IV SOLN: 10.0000 mg | INTRAVENOUS | Status: DC | PRN

## 2019-03-19 MED ORDER — METOPROLOL TARTRATE 5 MG/5ML IV SOLN
10.0000 mg | Freq: Once | INTRAVENOUS | Status: AC
  Administered 2019-03-19: 10 mg via INTRAVENOUS

## 2019-03-19 MED ORDER — FUROSEMIDE 10 MG/ML IJ SOLN
20.0000 mg | Freq: Once | INTRAMUSCULAR | Status: AC
  Administered 2019-03-19: 20 mg via INTRAVENOUS
  Filled 2019-03-19: qty 2

## 2019-03-19 MED ORDER — SODIUM CHLORIDE 0.9 % IV SOLN
2.0000 g | Freq: Two times a day (BID) | INTRAVENOUS | Status: DC
  Administered 2019-03-19 – 2019-03-24 (×11): 2 g via INTRAVENOUS
  Filled 2019-03-19 (×11): qty 2

## 2019-03-19 MED ORDER — ALBUTEROL SULFATE (2.5 MG/3ML) 0.083% IN NEBU: 2.5000 mg | INHALATION_SOLUTION | Freq: Four times a day (QID) | RESPIRATORY_TRACT | Status: DC | PRN

## 2019-03-19 MED ORDER — LORAZEPAM 2 MG/ML IJ SOLN
1.0000 mg | INTRAMUSCULAR | Status: DC | PRN
  Administered 2019-03-19 – 2019-04-04 (×7): 1 mg via INTRAVENOUS
  Filled 2019-03-19 (×9): qty 1

## 2019-03-19 MED ORDER — POTASSIUM CHLORIDE 20 MEQ/15ML (10%) PO SOLN
40.0000 meq | Freq: Two times a day (BID) | ORAL | Status: AC
  Administered 2019-03-19 (×2): 40 meq
  Filled 2019-03-19 (×2): qty 30

## 2019-03-19 NOTE — Progress Notes (Signed)
Patient ID: Travis Benson, male   DOB: 03-10-1961, 58 y.o.   MRN: [id number] Notified of patient HR 190s Sats 100% on BiPAP SBP 145 Lopressor IV x 1 broke tachycardia EKG shows junctional tachycardia Increased enteral lopressor If keeps happening will consult Cardiology  Violeta Gelinas, MD, MPH, FACS Trauma: [phone] General Surgery: [phone]
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
## 2019-03-19 NOTE — Progress Notes (Signed)
Patient's HR became more tachycardiac and irregular. MD on phone when patient's HR increased to there 200s. Order given to give 10 mg lopressor as well as 1 mg Ativan. MD on his way to the bedside. Ripley Fraise D

## 2019-03-19 NOTE — Progress Notes (Signed)
Patient started having frequent PVCs and increased HR as well as increased RR. Ativan was given and patient was placed on BiPAP. RR respond to treatment but patient was still having frequent PVCs. 100 mcg Fent given and Trauma MD was paged.Orders to give labetalol. Nurse tech reported that patient's temperature was 102.4. Tylenol given. MD will be up to see patient. Ripley Fraise D
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
## 2019-03-19 NOTE — Progress Notes (Signed)
Subjective:    The patient is extubated and in no apparent distress.  Objective: Vital signs in last 24 hours: Temp:  [99.9 F (37.7 C)-101.7 F (38.7 C)] 100.7 F (38.2 C) (03/20 0400) Pulse Rate:  [88-117] 95 (03/20 0500) Resp:  [20-38] 22 (03/20 0500) BP: (107-170)/(64-129) 160/70 (03/20 0500) SpO2:  [97 %-100 %] 100 % (03/20 0500) FiO2 (%):  [40 %] 40 % (03/19 2348) Weight:  [114.3 kg] 114.3 kg (03/20 0500) Estimated body mass index is 34.18 kg/m as calculated from the following:   Height as of this encounter: 6' (1.829 m).   Weight as of this encounter: 114.3 kg.   Intake/Output from previous day: 03/19 0701 - 03/20 0700 In: 1573.3 [I.V.:283.3; NG/GT:1290] Out: 2125 [Urine:1675; Drains:450] Intake/Output this shift: No intake/output data recorded.  Physical exam   He follows commands and moves all 4 extremities.  Lab Results: Recent Labs   03/19/19 0528 WBC 9.5 HGB 8.5* HCT 28.9* PLT 744*  BMET Recent Labs   03/18/19 0945 03/19/19 0528 NA 150* 151* K 3.7 3.4* CL 120* 118* CO2 26 26 GLUCOSE 145* 123* BUN 34* 29* CREATININE 0.99 1.00 CALCIUM 8.3* 8.2*   Studies/Results: Dg Chest Port 1 View  Result Date: 03/17/2019 CLINICAL DATA:  Shortness of breath EXAM: PORTABLE CHEST 1 VIEW COMPARISON:  03/16/2019 FINDINGS: Cardiac shadow is stable. Endotracheal tube is noted in satisfactory position. The gastric catheter is coiled within the stomach. The lungs are well aerated bilaterally. No focal infiltrate or sizable effusion is seen. No bony abnormality is noted. IMPRESSION: No acute abnormality seen. Tubes and lines as described. Electronically Signed   By: Alcide Clever M.D.   On: 03/17/2019 09:41    Assessment/Plan:   Left subdural hematoma versus hygroma, thoracic fractures: The patient is clinically stable.  I will plan to repeat his CT scan on Monday.  LOS: 19 days     Cristi Loron 03/19/2019, 8:02 AM
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
## 2019-03-19 NOTE — Progress Notes (Signed)
Rehab Admissions Coordinator Note:  Patient was screened by Clois Dupes for appropriateness for an Inpatient Acute Rehab Consult per PT and SLP recommendations.  At this time, we are recommending Inpatient Rehab consult. Please place order for consult.  Clois Dupes RN MSN 03/19/2019, 3:46 PM  I can be reached at [phone].

## 2019-03-19 NOTE — Consult Note (Signed)
WOC consult: for right lower extremity fasciotomies wound vac changes, medial and lateral aspects.  Pt was medicated prior to dressing change. Pt has reduced sensation to right LE. External fixator in place on upper and lower right extremity.  Wound 1: medical aspect  Right lower extremity lower extremity (calf) Appearance: beefy red wound bed, muscle exposed, with approx 1 cm of yellow slough around perimeter of wound, covering approx 25% of wound bed.  Also, yellow slough, dotted throughout wound bed that was mechanically debrided when previous wound vac sponge was removed. Drainage: mod amount of serosanguinous drainage, no odor noted Measurements: 37 x 10 x 0.8 cm Periwound: slightly macerated from moisture associated damage posterior to wound. Dressing: Mepitel was applied to wound bed,then 2 pieces of black sponge were applied, suction to continuous.   Wound 2: lateral aspect of right lower extremtiy Appearance: beefy red, with muscle exposed.  Yellow slough concentrated and distal and proximal ends of wounds of wound, 15 % yellow slough dotted through out wound bed.  Was mechanically debrided when sponge was removed.   Drainage: moderate amount of serosanguinous drainage, no odor noted. Measurements: 24 x 7 x 0.6 cm  Periwound: slightly macerated from MASD concentrated  posterior to wound. Dressing: Mepitel applied to wound bed, then 2 pieces of black sponge were applied, suction to continuous.   Pt tolerate procedure well.  Will continue to follow patient and change wound vac on M, W, F.    Extra supplies left at bedside.   Darlin Coco, WOC RN, MSN Janna Arch. Katrinka Blazing, RN, MSN student
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
## 2019-03-19 NOTE — Progress Notes (Signed)
Patient ID: Travis Benson, male   DOB: 04/13/1961, 58 y.o.   MRN: [id number] Tachycardia with PVCs Labetalol X 1 Increase lopressor BiPAP resumed I spoke with his wife  Violeta Gelinas, MD, MPH, FACS Trauma: [phone] General Surgery: [phone]

## 2019-03-20 ENCOUNTER — Inpatient Hospital Stay (HOSPITAL_COMMUNITY): Payer: Medicaid Other

## 2019-03-20 LAB — BASIC METABOLIC PANEL
Anion gap: 9 (ref 5–15)
BUN: 32 mg/dL — ABNORMAL HIGH (ref 6–20)
CO2: 26 mmol/L (ref 22–32)
Calcium: 8.4 mg/dL — ABNORMAL LOW (ref 8.9–10.3)
Chloride: 115 mmol/L — ABNORMAL HIGH (ref 98–111)
Creatinine, Ser: 1.08 mg/dL (ref 0.61–1.24)
GFR calc Af Amer: >60 mL/min (ref >60)
GFR calc non Af Amer: >60 mL/min (ref >60)
Glucose, Bld: 149 mg/dL — ABNORMAL HIGH (ref 70–99)
Potassium: 3.8 mmol/L (ref 3.5–5.1)
Sodium: 150 mmol/L — ABNORMAL HIGH (ref 135–145)

## 2019-03-20 LAB — GLUCOSE, CAPILLARY
GLUCOSE-CAPILLARY: 107 mg/dL — AB (ref 70–99)
GLUCOSE-CAPILLARY: 110 mg/dL — AB (ref 70–99)
Glucose-Capillary: 104 mg/dL — ABNORMAL HIGH (ref 70–99)
Glucose-Capillary: 113 mg/dL — ABNORMAL HIGH (ref 70–99)
Glucose-Capillary: 116 mg/dL — ABNORMAL HIGH (ref 70–99)
Glucose-Capillary: 123 mg/dL — ABNORMAL HIGH (ref 70–99)
Glucose-Capillary: 130 mg/dL — ABNORMAL HIGH (ref 70–99)

## 2019-03-20 LAB — CBC
HCT: 31.1 % — ABNORMAL LOW (ref 39.0–52.0)
Hemoglobin: 9 g/dL — ABNORMAL LOW (ref 13.0–17.0)
MCH: 28.8 pg (ref 26.0–34.0)
MCHC: 28.9 g/dL — ABNORMAL LOW (ref 30.0–36.0)
MCV: 99.7 fL (ref 80.0–100.0)
Platelets: 724 10*3/uL — ABNORMAL HIGH (ref 150–400)
RBC: 3.12 MIL/uL — ABNORMAL LOW (ref 4.22–5.81)
RDW: 16.2 % — ABNORMAL HIGH (ref 11.5–15.5)
WBC: 10.6 10*3/uL — AB (ref 4.0–10.5)
nRBC: 0 % (ref 0.0–0.2)

## 2019-03-20 MED ORDER — ORAL CARE MOUTH RINSE
15.0000 mL | Freq: Two times a day (BID) | OROMUCOSAL | Status: DC
  Administered 2019-03-21 – 2019-04-04 (×28): 15 mL via OROMUCOSAL

## 2019-03-20 MED ORDER — FREE WATER
250.0000 mL | Freq: Three times a day (TID) | Status: DC
  Administered 2019-03-20 – 2019-03-28 (×20): 250 mL

## 2019-03-20 MED ORDER — ACETAMINOPHEN 160 MG/5ML PO SOLN: 650.0000 mg | ORAL | Status: DC | PRN

## 2019-03-20 MED ORDER — ACETAMINOPHEN 160 MG/5ML PO SOLN
650.0000 mg | ORAL | Status: DC | PRN
  Administered 2019-03-23: 650 mg via ORAL
  Filled 2019-03-20 (×2): qty 20.3
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
## 2019-03-20 NOTE — Progress Notes (Signed)
  Patient ID: Travis Benson, male   DOB: 1961/07/13, 58 y.o.   MRN: [id number] Vital signs are stable For follow-up CT on Monday regarding subdural collections Without change clinically
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
## 2019-03-20 NOTE — Progress Notes (Signed)
Pt having multiple large liquid stools.  Dr. Janee Morn, Trauma on call notified, flexiseal orders given.  Will continue to monitor.

## 2019-03-21 LAB — BASIC METABOLIC PANEL
Anion gap: 8 (ref 5–15)
BUN: 35 mg/dL — ABNORMAL HIGH (ref 6–20)
CALCIUM: 8.4 mg/dL — AB (ref 8.9–10.3)
CO2: 27 mmol/L (ref 22–32)
Chloride: 113 mmol/L — ABNORMAL HIGH (ref 98–111)
Creatinine, Ser: 0.96 mg/dL (ref 0.61–1.24)
GFR calc Af Amer: >60 mL/min (ref >60)
GFR calc non Af Amer: >60 mL/min (ref >60)
Glucose, Bld: 132 mg/dL — ABNORMAL HIGH (ref 70–99)
Potassium: 3.9 mmol/L (ref 3.5–5.1)
Sodium: 148 mmol/L — ABNORMAL HIGH (ref 135–145)

## 2019-03-21 LAB — GLUCOSE, CAPILLARY
GLUCOSE-CAPILLARY: 119 mg/dL — AB (ref 70–99)
GLUCOSE-CAPILLARY: 122 mg/dL — AB (ref 70–99)
Glucose-Capillary: 114 mg/dL — ABNORMAL HIGH (ref 70–99)
Glucose-Capillary: 122 mg/dL — ABNORMAL HIGH (ref 70–99)
Glucose-Capillary: 148 mg/dL — ABNORMAL HIGH (ref 70–99)
Glucose-Capillary: 122 mg/dL — ABNORMAL HIGH (ref 70–99)

## 2019-03-21 MED ORDER — GUAIFENESIN 100 MG/5ML PO SOLN
15.0000 mL | Freq: Four times a day (QID) | ORAL | Status: DC
  Administered 2019-03-21 – 2019-04-06 (×49): 300 mg via ORAL
  Filled 2019-03-21: qty 20
  Filled 2019-03-21: qty 10
  Filled 2019-03-21: qty 20
  Filled 2019-03-21 (×4): qty 10
  Filled 2019-03-21 (×3): qty 20
  Filled 2019-03-21 (×2): qty 10
  Filled 2019-03-21: qty 20
  Filled 2019-03-21: qty 10
  Filled 2019-03-21 (×3): qty 20
  Filled 2019-03-21: qty 150
  Filled 2019-03-21: qty 20
  Filled 2019-03-21: qty 100
  Filled 2019-03-21: qty 10
  Filled 2019-03-21: qty 20
  Filled 2019-03-21: qty 10
  Filled 2019-03-21 (×9): qty 20
  Filled 2019-03-21: qty 30
  Filled 2019-03-21 (×2): qty 20
  Filled 2019-03-21: qty 10
  Filled 2019-03-21 (×3): qty 20
  Filled 2019-03-21: qty 10
  Filled 2019-03-21 (×8): qty 20
  Filled 2019-03-21 (×2): qty 10

## 2019-03-21 NOTE — Progress Notes (Signed)
Patient ID: Travis Benson, male   DOB: 29-Nov-1961, 58 y.o.   MRN: [id number] Vital signs are stable Patient opens eyes and seems to attend better I could not get him to follow commands though nurses note that he spoke a few words to his wife today Continue supportive care
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
## 2019-03-22 ENCOUNTER — Inpatient Hospital Stay (HOSPITAL_COMMUNITY): Payer: Medicaid Other

## 2019-03-22 ENCOUNTER — Encounter (HOSPITAL_COMMUNITY): Payer: Self-pay

## 2019-03-22 DIAGNOSIS — S069X2S Unspecified intracranial injury with loss of consciousness of 31 minutes to 59 minutes, sequela: Secondary | ICD-10-CM

## 2019-03-22 DIAGNOSIS — S32810S Multiple fractures of pelvis with stable disruption of pelvic ring, sequela: Secondary | ICD-10-CM

## 2019-03-22 DIAGNOSIS — S83104S Unspecified dislocation of right knee, sequela: Secondary | ICD-10-CM

## 2019-03-22 LAB — GLUCOSE, CAPILLARY
GLUCOSE-CAPILLARY: 117 mg/dL — AB (ref 70–99)
Glucose-Capillary: 115 mg/dL — ABNORMAL HIGH (ref 70–99)
Glucose-Capillary: 118 mg/dL — ABNORMAL HIGH (ref 70–99)
Glucose-Capillary: 132 mg/dL — ABNORMAL HIGH (ref 70–99)
Glucose-Capillary: 140 mg/dL — ABNORMAL HIGH (ref 70–99)

## 2019-03-22 LAB — CBC
HCT: 30.5 % — ABNORMAL LOW (ref 39.0–52.0)
HEMOGLOBIN: 9.2 g/dL — AB (ref 13.0–17.0)
MCH: 29.2 pg (ref 26.0–34.0)
MCHC: 30.2 g/dL (ref 30.0–36.0)
MCV: 96.8 fL (ref 80.0–100.0)
Platelets: 703 10*3/uL — ABNORMAL HIGH (ref 150–400)
RBC: 3.15 MIL/uL — ABNORMAL LOW (ref 4.22–5.81)
RDW: 15.6 % — ABNORMAL HIGH (ref 11.5–15.5)
WBC: 11.1 10*3/uL — ABNORMAL HIGH (ref 4.0–10.5)
nRBC: 0 % (ref 0.0–0.2)

## 2019-03-22 MED ORDER — CHLORHEXIDINE GLUCONATE 0.12 % MT SOLN
OROMUCOSAL | Status: AC
  Administered 2019-03-22: 15 mL
  Filled 2019-03-22: qty 15
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
## 2019-03-22 NOTE — Progress Notes (Signed)
  Progress Note    03/22/2019 10:40 AM 17 Days Post-Op  Subjective:  No overnight issues  Vitals:   03/22/19 0800 03/22/19 0827  BP:  119/62  Pulse: (!) 103 (!) 101  Resp: (!) 27 (!) 22  Temp:  98.5 F (36.9 C)  SpO2: 100% 96%    Physical Exam: He is alert this morning and answering questions appropriately Right above-knee wound sites healing well with staples Below the knee wound VAC clean dry intact Palpable right dorsalis pedis Patient does not appear to have sensory or motor function of his right foot.  CBC    Component Value Date/Time   WBC 11.1 (H) 03/22/2019 0515   RBC 3.15 (L) 03/22/2019 0515   HGB 9.2 (L) 03/22/2019 0515   HCT 30.5 (L) 03/22/2019 0515   PLT 703 (H) 03/22/2019 0515   MCV 96.8 03/22/2019 0515   MCH 29.2 03/22/2019 0515   MCHC 30.2 03/22/2019 0515   RDW 15.6 (H) 03/22/2019 0515   LYMPHSABS 1.0 03/15/2019 0604   MONOABS 1.4 (H) 03/15/2019 0604   EOSABS 0.2 03/15/2019 0604   BASOSABS 0.0 03/15/2019 0604    BMET    Component Value Date/Time   NA 148 (H) 03/21/2019 0415   K 3.9 03/21/2019 0415   CL 113 (H) 03/21/2019 0415   CO2 27 03/21/2019 0415   GLUCOSE 132 (H) 03/21/2019 0415   BUN 35 (H) 03/21/2019 0415   CREATININE 0.96 03/21/2019 0415   CALCIUM 8.4 (L) 03/21/2019 0415   GFRNONAA >60 03/21/2019 0415   GFRAA >60 03/21/2019 0415    INR    Component Value Date/Time   INR 1.1 03/01/2019 1450     Intake/Output Summary (Last 24 hours) at 03/22/2019 1040 Last data filed at 03/22/2019 0900 Gross per 24 hour  Intake 2788.94 ml  Output 3695 ml  Net -906.06 ml     Assessment:  58 y.o. male is s/p bypass and 4 compartment fasciotomy right lower extremity for trauma.  Plan: Appears to have insight state non-functional right foot and ankle however does not appear to have mental capacity yet to make decisions for future. Continue wound VAC change Monday Wednesday Friday Likely will remove staples this week.   Keithen Capo C. Randie Heinz,  MD [facility] Office: [phone] Pager: [phone]  03/22/2019 10:40 AM
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
## 2019-03-22 NOTE — Progress Notes (Signed)
Inpatient Rehabilitation Admissions Coordinator  Patient not at a level to tolerate the intensity of an inpt rehab admit at this time and RLE issues continue. I will follow his progress.  Ottie Glazier, RN, MSN Rehab Admissions Coordinator [phone] 03/22/2019 12:50 PM
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
## 2019-03-22 NOTE — Progress Notes (Signed)
Orthopedic Tech Progress Note Patient Details:  Travis Benson 1961-04-07 [id number] Called to [facility] to get resting hand splints Patient ID: Travis Benson, male   DOB: 06-13-1961, 58 y.o.   MRN: [id number]   Travis Benson 03/22/2019, 10:55 AM

## 2019-03-22 NOTE — Consult Note (Signed)
WOC Nurse wound follow up Wound type: surgical; lateral and medial fasciotomy RLE  Measurement: RLE; lateral; 25cm x 7cm x 0.2cm  RLE; medial; 33cm x 10cm x 0.2cm  Wound bed: both muscles appear viable, bleeding, pink. 10% slough noted along the distal aspect of the medial calf wound Drainage (amount, consistency, odor) see flowsheets for VAC canister recordings. No odor with dressing change Periwound: edema Dressing procedure/placement/frequency: Removed old NPWT dressing Muscle protected with silicone layer  Filled wound with __1__ piece of black foam; lateral incision Filled wound with 3 pc of black foam; medial incision Sealed NPWT dressing at HG Patient received IV pain medication per bedside nurse prior to dressing change Patient tolerated procedure well  WOC nurse will continue to provide NPWT dressing changed due to the complexity of the dressing change. M/W/F  Verified with vascular surgery team, they did not need to assess wounds today.   Maisey Deandrade [facility], CNS, [employer] [phone]

## 2019-03-22 NOTE — Progress Notes (Signed)
Subjective: The patient is in no apparent distress.  Objective: Vital signs in last 24 hours: Temp:  [98.2 F (36.8 C)-99.2 F (37.3 C)] 98.5 F (36.9 C) (03/23 0827) Pulse Rate:  [86-108] 101 (03/23 0827) Resp:  [16-34] 22 (03/23 0827) BP: (116-157)/(61-109) 119/62 (03/23 0827) SpO2:  [96 %-100 %] 96 % (03/23 0827) Estimated body mass index is 34.09 kg/m as calculated from the following:   Height as of this encounter: 6' (1.829 m).   Weight as of this encounter: 114 kg.   Intake/Output from previous day: 03/22 0701 - 03/23 0700 In: 2788.9 [I.V.:228.9; NG/GT:2340; IV Piggyback:200] Out: 3445 [Urine:2775; Drains:350; Stool:320] Intake/Output this shift: Total I/O In: 0  Out: 700 [Urine:700]  Physical exam the patient is alert and attentive.  He follows commands.  We will move his left toes.  I do not see any movement in his right lower extremities.  I reviewed the patient's follow-up head CT.  It demonstrates no significant change in the patient's left frontal chronic subdural hematoma with some mass-effect and midline shift.  Lab Results: Recent Labs    03/20/19 0451 03/22/19 0515  WBC 10.6* 11.1*  HGB 9.0* 9.2*  HCT 31.1* 30.5*  PLT 724* 703*   BMET Recent Labs    03/20/19 0451 03/21/19 0415  NA 150* 148*  K 3.8 3.9  CL 115* 113*  CO2 26 27  GLUCOSE 149* 132*  BUN 32* 35*  CREATININE 1.08 0.96  CALCIUM 8.4* 8.4*    Studies/Results: Ct Head Wo Contrast  Result Date: 03/22/2019 CLINICAL DATA:  Follow-up head trauma EXAM: CT HEAD WITHOUT CONTRAST TECHNIQUE: Contiguous axial images were obtained from the base of the skull through the vertex without intravenous contrast. COMPARISON:  03/15/2019, 03/12/2019, 02/28/2019 FINDINGS: Brain: Redemonstrated low-attenuation left frontal subdural collection, measuring approximately 1.1 cm in maximal thickness, unchanged from prior. Unchanged, approximately 4 mm left-to-right midline shift. Vascular: No hyperdense vessel  or unexpected calcification. Skull: Normal. Negative for fracture or focal lesion. Sinuses/Orbits: No acute finding. Redemonstrated fluid opacification of the bilateral mastoid air cells. Other: None. IMPRESSION: 1. 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 Unchanged, approximately 4 mm left-to-right midline shift. 2. Redemonstrated fluid opacification of the bilateral mastoid air cells. Electronically Signed   By: Lauralyn Primes M.D.   On: 03/22/2019 09:29    Assessment/Plan: Left frontal subdural hematoma: He seems to be improving neurologically.  Observation is warranted.  Thoracic fractures: These should heal without intervention.  He should wear TLSO when he is out of bed or head of bed is greater than 45 degrees.  LOS: 22 days     Cristi Loron 03/22/2019, 10:19 AM
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
## 2019-03-23 ENCOUNTER — Inpatient Hospital Stay (HOSPITAL_COMMUNITY): Payer: Medicaid Other

## 2019-03-23 LAB — CBC
HEMATOCRIT: 30.5 % — AB (ref 39.0–52.0)
HEMOGLOBIN: 9.2 g/dL — AB (ref 13.0–17.0)
MCH: 28.8 pg (ref 26.0–34.0)
MCHC: 30.2 g/dL (ref 30.0–36.0)
MCV: 95.6 fL (ref 80.0–100.0)
Platelets: 650 10*3/uL — ABNORMAL HIGH (ref 150–400)
RBC: 3.19 MIL/uL — ABNORMAL LOW (ref 4.22–5.81)
RDW: 15.3 % (ref 11.5–15.5)
WBC: 12.7 10*3/uL — ABNORMAL HIGH (ref 4.0–10.5)
nRBC: 0 % (ref 0.0–0.2)

## 2019-03-23 LAB — GLUCOSE, CAPILLARY
GLUCOSE-CAPILLARY: 105 mg/dL — AB (ref 70–99)
GLUCOSE-CAPILLARY: 134 mg/dL — AB (ref 70–99)
Glucose-Capillary: 121 mg/dL — ABNORMAL HIGH (ref 70–99)
Glucose-Capillary: 123 mg/dL — ABNORMAL HIGH (ref 70–99)
Glucose-Capillary: 131 mg/dL — ABNORMAL HIGH (ref 70–99)
Glucose-Capillary: 131 mg/dL — ABNORMAL HIGH (ref 70–99)
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
## 2019-03-23 NOTE — Progress Notes (Signed)
Patient off the unit for multiple CTs. Spoke with Physical therapy and Nurse to remove TLSO brace when patient comes back.  Patient head of bed 20 degrees or less when TLSO brace is off.

## 2019-03-23 NOTE — Progress Notes (Signed)
   Palpable dorsalis pedis pulse.  Patient has foot drop appears that he does not have any sensory or motor function of the right foot below the knee does have proximal right lower extremity movement.  Staples can come out of the right thigh and the 3 incisions.  If there are questions please contact me.  I discussed with Dr. Carola Frost possible skin grafts prior to discharge.  Keryl Gholson C. Randie Heinz, MD [facility]  Cell: [phone]  Pager: [phone]

## 2019-03-23 NOTE — Progress Notes (Signed)
PT/OT please call wife for an update about how patient did this morning, she has already called. Thank you!
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
## 2019-03-23 NOTE — Progress Notes (Signed)
Patient had a splint made for foot drop of right lower extremity earlier today.  Adhesive has come off of the bottom of the splint. Nurse tried to reapply with no success. Nurse paged OT to alert.

## 2019-03-23 NOTE — Progress Notes (Signed)
Occupational Therapy NOTE  Resting Hand Splint  RN STAFF  Wear schedule: Splint to be worn at night with extremities elevated on pillows  Please check skin when removing splint to assess for: * pain * redness *swelling  If any symptoms above present remove splint for 15 minutes. If symptoms continue - keep the splint removed and notify OT staff [phone] immediately.   Keep the UE elevated at all times on pillows / towels.  Splint can be cleaned with warm soapy water and alcohol swab. Splint should not be placed in heat of any kind because the splint with mold into a new shape.

## 2019-03-23 NOTE — Progress Notes (Signed)
Sutures removed.  Patient unable to cough up a sputum sample. Nurse reached out to respiratory therapy about other options to collect sputum.

## 2019-03-23 NOTE — Progress Notes (Signed)
Temperature checked at 0000,=101.2. Tylenol given, Temperature rechecked at 0125, temperature =99.0

## 2019-03-23 NOTE — Progress Notes (Signed)
Wife called for an update of patient overnight.

## 2019-03-23 NOTE — Progress Notes (Signed)
Occupational Therapy NOTE  Foot Plate for Right Leg  RN STAFF  Wear Schedule: Foot Plate to be worn for 4 hours Twice a day - once in morning (8am-12pm) and once in afternoon (2pm-6pm) AND at night when patient is sleeping  Please remove splint check skin every 4 hours during shift to assess for: * pain * redness *swelling  If any symptoms above present remove splint for 15 minutes. If symptoms continue - keep the splint removed and notify OT staff [phone] immediately.   Keep the RLE elevated at all times on pillows / towels.  Splint can be cleaned with warm soapy water and alcohol swab. Splint should not be placed in heat of any kind because the splint with mold into a new shape.     [name] MSOT, OTR/L Acute Rehab Pager: [phone] Office: [phone]
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
## 2019-03-23 NOTE — Progress Notes (Signed)
Staples removed 

## 2019-03-23 NOTE — Progress Notes (Signed)
[phone] Infectious Disease called asking about airborne contact for patient with shingles. Nurse found positive shingle results in infection/precaution tab. Valtrex started 3/11 then discontinued 3/12.  Will ask Trauma about the order and return call to I.D.

## 2019-03-24 LAB — BASIC METABOLIC PANEL
ANION GAP: 6 (ref 5–15)
Anion gap: 9 (ref 5–15)
BUN: 32 mg/dL — ABNORMAL HIGH (ref 6–20)
BUN: 31 mg/dL — ABNORMAL HIGH (ref 6–20)
CALCIUM: 8.4 mg/dL — AB (ref 8.9–10.3)
CO2: 25 mmol/L (ref 22–32)
CO2: 26 mmol/L (ref 22–32)
Calcium: 8.2 mg/dL — ABNORMAL LOW (ref 8.9–10.3)
Chloride: 101 mmol/L (ref 98–111)
Chloride: 105 mmol/L (ref 98–111)
Creatinine, Ser: 1.02 mg/dL (ref 0.61–1.24)
Creatinine, Ser: 1 mg/dL (ref 0.61–1.24)
GFR calc Af Amer: >60 mL/min (ref >60)
GFR calc non Af Amer: >60 mL/min (ref >60)
GFR calc non Af Amer: >60 mL/min (ref >60)
Glucose, Bld: 139 mg/dL — ABNORMAL HIGH (ref 70–99)
Glucose, Bld: 143 mg/dL — ABNORMAL HIGH (ref 70–99)
Potassium: 4 mmol/L (ref 3.5–5.1)
Potassium: 4.1 mmol/L (ref 3.5–5.1)
Sodium: 135 mmol/L (ref 135–145)
Sodium: 137 mmol/L (ref 135–145)

## 2019-03-24 LAB — CULTURE, BLOOD (ROUTINE X 2)
Culture: NO GROWTH
Culture: NO GROWTH

## 2019-03-24 LAB — CBC
HCT: 30.1 % — ABNORMAL LOW (ref 39.0–52.0)
HEMOGLOBIN: 9.3 g/dL — AB (ref 13.0–17.0)
MCH: 29.1 pg (ref 26.0–34.0)
MCHC: 30.9 g/dL (ref 30.0–36.0)
MCV: 94.1 fL (ref 80.0–100.0)
Platelets: 630 10*3/uL — ABNORMAL HIGH (ref 150–400)
RBC: 3.2 MIL/uL — ABNORMAL LOW (ref 4.22–5.81)
RDW: 15.3 % (ref 11.5–15.5)
WBC: 14.1 10*3/uL — ABNORMAL HIGH (ref 4.0–10.5)
nRBC: 0 % (ref 0.0–0.2)

## 2019-03-24 LAB — GLUCOSE, CAPILLARY
GLUCOSE-CAPILLARY: 114 mg/dL — AB (ref 70–99)
GLUCOSE-CAPILLARY: 139 mg/dL — AB (ref 70–99)
Glucose-Capillary: 151 mg/dL — ABNORMAL HIGH (ref 70–99)
Glucose-Capillary: 119 mg/dL — ABNORMAL HIGH (ref 70–99)
Glucose-Capillary: 151 mg/dL — ABNORMAL HIGH (ref 70–99)
Glucose-Capillary: 152 mg/dL — ABNORMAL HIGH (ref 70–99)

## 2019-03-24 MED ORDER — SODIUM CHLORIDE 0.9 % IV SOLN
2.0000 g | Freq: Three times a day (TID) | INTRAVENOUS | Status: DC
  Administered 2019-03-24 – 2019-03-28 (×11): 2 g via INTRAVENOUS
  Filled 2019-03-24 (×12): qty 2

## 2019-03-24 NOTE — Progress Notes (Signed)
PHARMACY NOTE:  ANTIMICROBIAL RENAL DOSAGE ADJUSTMENT  Current antimicrobial regimen includes a mismatch between antimicrobial dosage and estimated renal function.  As per policy approved by the Pharmacy & Therapeutics and Medical Executive Committees, the antimicrobial dosage will be adjusted accordingly.  Current antimicrobial dosage:  Cefepime 2g IV every 12 hours  Indication: Possible Pneumonia  Renal Function:  Estimated Creatinine Clearance: 101.9 mL/min (by C-G formula based on SCr of 1.02 mg/dL). []      On intermittent HD, scheduled: []      On CRRT    Antimicrobial dosage has been changed to:  Cefepime 2g IV every 8 hours   Additional comments: at risk of pseudomonas due to lengthy hospital stay    Thank you for allowing pharmacy to be a part of this patient's care.  Fayne Norrie, [facility] 03/24/2019 12:43 PM
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
## 2019-03-24 NOTE — Consult Note (Signed)
WOC Nurse wound follow up Wound type: surgical medial and lateral RLE Measurement: see previous note Wound bed: both are mostly clean, viable muscle, noted small amount of slough along the proximal end of the lateral incision. Overall otherwise clean. Slough improved at the distal aspect of the medial incision, however exposed tendon note. New areas of soft slough at the proximal end of the medial incision less than 5%. Drainage (amount, consistency, odor) serous mainly in the [facility] canisters, see nursing flowsheets for amounts. No odor with dressing change  Periwound: edema, still quite significant. New area of 100% eschar noted distal to the malleolus. Aprox. 3cm x 4.5cm x 0cm, not open Dressing procedure/placement/frequency: Removed old NPWT dressing Cleaned with dry dressing for gelatinous drainage noted on the lateral wound. Covered lateral wound with ___1_ piece of silicone nonadherent and 1 piece of black foam. Covered medial wound with 2 pieces of silicone nonadherent and 2 pieces of black foam  Sealed NPWT dressing at HG Patient received IVpain medication per bedside nurse prior to dressing change Patient tolerated procedure well Spoke with Dr. Randie Heinz prior to dressing change to verify if he needed to assess wounds today, requested I proceed without him today. Dr. Janee Morn from trauma in the room to see patient while dressing being changed.   WOC nurse will continue to provide NPWT dressing changed due to the complexity of the dressing change.    Supplies ordered for next dressing change (M/W/F).  Arnell Mausolf [facility], CNS, [employer] [phone]

## 2019-03-24 NOTE — Progress Notes (Signed)
Inpatient Rehabilitation Admissions Coordinator  Patient not a candidate for an inpt rehab admission in the foreseeable future. Wife unable to provide the projected caregiver support due to work. Complex medical case. I contacted RN CM, Raynelle Fanning and we will sign off at this time. SNF recommended after medical issues resolved.  Ottie Glazier, RN, MSN Rehab Admissions Coordinator [phone] 03/24/2019 11:58 AM
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
## 2019-03-25 LAB — GLUCOSE, CAPILLARY
Glucose-Capillary: 134 mg/dL — ABNORMAL HIGH (ref 70–99)
Glucose-Capillary: 144 mg/dL — ABNORMAL HIGH (ref 70–99)
Glucose-Capillary: 145 mg/dL — ABNORMAL HIGH (ref 70–99)
Glucose-Capillary: 138 mg/dL — ABNORMAL HIGH (ref 70–99)
Glucose-Capillary: 132 mg/dL — ABNORMAL HIGH (ref 70–99)
Glucose-Capillary: 128 mg/dL — ABNORMAL HIGH (ref 70–99)

## 2019-03-25 LAB — CBC
HCT: 31.8 % — ABNORMAL LOW (ref 39.0–52.0)
Hemoglobin: 9.9 g/dL — ABNORMAL LOW (ref 13.0–17.0)
MCH: 29.3 pg (ref 26.0–34.0)
MCHC: 31.1 g/dL (ref 30.0–36.0)
MCV: 94.1 fL (ref 80.0–100.0)
Platelets: 748 10*3/uL — ABNORMAL HIGH (ref 150–400)
RBC: 3.38 MIL/uL — ABNORMAL LOW (ref 4.22–5.81)
RDW: 15.6 % — ABNORMAL HIGH (ref 11.5–15.5)
WBC: 16.5 10*3/uL — ABNORMAL HIGH (ref 4.0–10.5)
nRBC: 0 % (ref 0.0–0.2)

## 2019-03-25 LAB — BASIC METABOLIC PANEL
ANION GAP: 4 — AB (ref 5–15)
BUN: 32 mg/dL — ABNORMAL HIGH (ref 6–20)
CALCIUM: 8.3 mg/dL — AB (ref 8.9–10.3)
CO2: 25 mmol/L (ref 22–32)
CREATININE: 0.96 mg/dL (ref 0.61–1.24)
Chloride: 107 mmol/L (ref 98–111)
GFR calc Af Amer: >60 mL/min (ref >60)
GFR calc non Af Amer: >60 mL/min (ref >60)
Glucose, Bld: 146 mg/dL — ABNORMAL HIGH (ref 70–99)
Potassium: 4.2 mmol/L (ref 3.5–5.1)
Sodium: 136 mmol/L (ref 135–145)

## 2019-03-25 MED ORDER — CEFAZOLIN SODIUM-DEXTROSE 2-4 GM/100ML-% IV SOLN
2.0000 g | INTRAVENOUS | Status: AC
  Administered 2019-03-26: 2 g via INTRAVENOUS
  Filled 2019-03-25 (×2): qty 100
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
## 2019-03-25 NOTE — Progress Notes (Signed)
Subjective: The patient is alert and pleasant.  He is in no apparent distress.  Objective: Vital signs in last 24 hours: Temp:  [97.9 F (36.6 C)-100.7 F (38.2 C)] 100.7 F (38.2 C) (03/26 1112) Pulse Rate:  [78-109] 78 (03/26 1112) Resp:  [15-34] 15 (03/26 1112) BP: (108-169)/(73-102) 108/73 (03/26 1112) SpO2:  [95 %-100 %] 98 % (03/26 1112) Estimated body mass index is 34.24 kg/m as calculated from the following:   Height as of this encounter: 6' (1.829 m).   Weight as of this encounter: 114.5 kg.   Intake/Output from previous day: 03/25 0701 - 03/26 0700 In: 1653.7 [I.V.:233.7; NG/GT:1120; IV Piggyback:300] Out: 4750 [Urine:4575; Drains:125; Stool:50] Intake/Output this shift: Total I/O In: 44.6 [I.V.:44.6] Out: 200 [Urine:200]  Physical exam patient is alert and pleasant.  He will answer simple questions.  He is moving all 4 extremities.  He follows commands.  Lab Results: Recent Labs    03/24/19 0540 03/25/19 0427  WBC 14.1* 16.5*  HGB 9.3* 9.9*  HCT 30.1* 31.8*  PLT 630* 748*   BMET Recent Labs    03/24/19 1345 03/25/19 0427  NA 137 136  K 4.1 4.2  CL 105 107  CO2 26 25  GLUCOSE 143* 146*  BUN 31* 32*  CREATININE 1.00 0.96  CALCIUM 8.4* 8.3*    Studies/Results: No results found.  Assessment/Plan: Left subdural hematoma, thoracic fractures: The patient is stable, and improving, clinically.  LOS: 25 days     Cristi Loron 03/25/2019, 12:49 PM
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
## 2019-03-25 NOTE — Progress Notes (Signed)
Called to room by PT.  PT states they set patient up to bedside and patient became unresponsive.  Patient was broke out into profuse sweat upon entering the room.  Patient responsive when I arrived and answered questions appropriately.  Will monitor closely.
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
## 2019-03-25 NOTE — Progress Notes (Signed)
[facility]   Discussed with vascular team, sounds as if they have made same recommendation in the past but wife wants pt to make the final decision.  Pt not really in a mental state to indicate what he wants at this time.  Ideally we would like for the patient to talk with an aged matched amputee to see what life would be like with an AKA.   Will proceed with STSG of R leg tomorrow late morning  Does carry elevated risk for complications May ultimately need AKA  Hold tube feeds at midnight   Mearl Latin, PA-C [phone] (C) 03/25/2019, 8:29 PM  [facility] [address] [place] [address] [phone] Travis Benson (F)

## 2019-03-26 ENCOUNTER — Inpatient Hospital Stay (HOSPITAL_COMMUNITY): Payer: Medicaid Other | Admitting: Certified Registered Nurse Anesthetist

## 2019-03-26 ENCOUNTER — Encounter (HOSPITAL_COMMUNITY): Payer: Self-pay | Admitting: Certified Registered Nurse Anesthetist

## 2019-03-26 ENCOUNTER — Encounter (HOSPITAL_COMMUNITY): Admission: EM | Disposition: A | Discharge status: Skilled Nursing Facility | Payer: Self-pay | Source: Home / Self Care

## 2019-03-26 HISTORY — PX: SKIN SPLIT GRAFT: SHX444

## 2019-03-26 LAB — CULTURE, RESPIRATORY W GRAM STAIN

## 2019-03-26 LAB — CULTURE, RESPIRATORY

## 2019-03-26 LAB — GLUCOSE, CAPILLARY
GLUCOSE-CAPILLARY: 112 mg/dL — AB (ref 70–99)
GLUCOSE-CAPILLARY: 147 mg/dL — AB (ref 70–99)
Glucose-Capillary: 117 mg/dL — ABNORMAL HIGH (ref 70–99)
Glucose-Capillary: 140 mg/dL — ABNORMAL HIGH (ref 70–99)

## 2019-03-26 SURGERY — APPLICATION, GRAFT, SKIN, SPLIT-THICKNESS
Anesthesia: General | Site: Leg Lower | Laterality: Right

## 2019-03-26 MED ORDER — ROCURONIUM BROMIDE 50 MG/5ML IV SOSY
PREFILLED_SYRINGE | INTRAVENOUS | Status: DC | PRN
  Administered 2019-03-26: 30 mg via INTRAVENOUS
  Administered 2019-03-26: 20 mg via INTRAVENOUS

## 2019-03-26 MED ORDER — PHENYLEPHRINE 40 MCG/ML (10ML) SYRINGE FOR IV PUSH (FOR BLOOD PRESSURE SUPPORT)
PREFILLED_SYRINGE | INTRAVENOUS | Status: DC | PRN
  Administered 2019-03-26 (×2): 120 ug via INTRAVENOUS
  Administered 2019-03-26 (×2): 80 ug via INTRAVENOUS

## 2019-03-26 MED ORDER — DEXAMETHASONE SODIUM PHOSPHATE 10 MG/ML IJ SOLN
INTRAMUSCULAR | Status: DC | PRN
  Administered 2019-03-26: 10 mg via INTRAVENOUS

## 2019-03-26 MED ORDER — MIDAZOLAM HCL 5 MG/5ML IJ SOLN
INTRAMUSCULAR | Status: DC | PRN
  Administered 2019-03-26: 2 mg via INTRAVENOUS

## 2019-03-26 MED ORDER — ROCURONIUM BROMIDE 50 MG/5ML IV SOSY
PREFILLED_SYRINGE | INTRAVENOUS | Status: AC
  Filled 2019-03-26: qty 5

## 2019-03-26 MED ORDER — FENTANYL CITRATE (PF) 100 MCG/2ML IJ SOLN: 25.0000 ug | INTRAMUSCULAR | Status: DC | PRN

## 2019-03-26 MED ORDER — ONDANSETRON HCL 4 MG/2ML IJ SOLN
INTRAMUSCULAR | Status: DC | PRN
  Administered 2019-03-26: 4 mg via INTRAVENOUS

## 2019-03-26 MED ORDER — MINERAL OIL LIGHT 100 % EX OIL
TOPICAL_OIL | CUTANEOUS | Status: DC | PRN
  Administered 2019-03-26: 1 via TOPICAL

## 2019-03-26 MED ORDER — BUPIVACAINE-EPINEPHRINE 0.25% -1:200000 IJ SOLN
INTRAMUSCULAR | Status: AC
  Filled 2019-03-26: qty 1

## 2019-03-26 MED ORDER — PROPOFOL 10 MG/ML IV BOLUS
INTRAVENOUS | Status: DC | PRN
  Administered 2019-03-26: 150 mg via INTRAVENOUS

## 2019-03-26 MED ORDER — SUGAMMADEX SODIUM 200 MG/2ML IV SOLN
INTRAVENOUS | Status: DC | PRN
  Administered 2019-03-26: 228.2 mg via INTRAVENOUS

## 2019-03-26 MED ORDER — FENTANYL CITRATE (PF) 250 MCG/5ML IJ SOLN
INTRAMUSCULAR | Status: AC
  Filled 2019-03-26: qty 5

## 2019-03-26 MED ORDER — LACTATED RINGERS IV SOLN
INTRAVENOUS | Status: DC
  Administered 2019-03-26: 12:00:00 via INTRAVENOUS

## 2019-03-26 MED ORDER — BUPIVACAINE-EPINEPHRINE (PF) 0.25% -1:200000 IJ SOLN
INTRAMUSCULAR | Status: DC | PRN
  Administered 2019-03-26: 100 mL

## 2019-03-26 MED ORDER — MIDAZOLAM HCL 2 MG/2ML IJ SOLN
INTRAMUSCULAR | Status: AC
  Filled 2019-03-26: qty 2

## 2019-03-26 MED ORDER — LIDOCAINE 2% (20 MG/ML) 5 ML SYRINGE
INTRAMUSCULAR | Status: AC
  Filled 2019-03-26: qty 5

## 2019-03-26 MED ORDER — SODIUM CHLORIDE 0.9 % IV SOLN
INTRAVENOUS | Status: DC | PRN
  Administered 2019-03-26: 30 ug/min via INTRAVENOUS

## 2019-03-26 MED ORDER — PHENYLEPHRINE 40 MCG/ML (10ML) SYRINGE FOR IV PUSH (FOR BLOOD PRESSURE SUPPORT)
PREFILLED_SYRINGE | INTRAVENOUS | Status: AC
  Filled 2019-03-26: qty 10

## 2019-03-26 MED ORDER — FENTANYL CITRATE (PF) 250 MCG/5ML IJ SOLN
INTRAMUSCULAR | Status: DC | PRN
  Administered 2019-03-26: 50 ug via INTRAVENOUS
  Administered 2019-03-26 (×2): 25 ug via INTRAVENOUS
  Administered 2019-03-26 (×2): 50 ug via INTRAVENOUS

## 2019-03-26 MED ORDER — 0.9 % SODIUM CHLORIDE (POUR BTL) OPTIME
TOPICAL | Status: DC | PRN
  Administered 2019-03-26: 1000 mL

## 2019-03-26 MED ORDER — DEXAMETHASONE SODIUM PHOSPHATE 10 MG/ML IJ SOLN
INTRAMUSCULAR | Status: AC
  Filled 2019-03-26: qty 1

## 2019-03-26 MED ORDER — LACTATED RINGERS IV SOLN
INTRAVENOUS | Status: DC | PRN
  Administered 2019-03-26 (×2): via INTRAVENOUS

## 2019-03-26 MED ORDER — SUCCINYLCHOLINE CHLORIDE 200 MG/10ML IV SOSY
PREFILLED_SYRINGE | INTRAVENOUS | Status: AC
  Filled 2019-03-26: qty 10

## 2019-03-26 MED ORDER — PROPOFOL 10 MG/ML IV BOLUS
INTRAVENOUS | Status: AC
  Filled 2019-03-26: qty 20

## 2019-03-26 MED ORDER — ONDANSETRON HCL 4 MG/2ML IJ SOLN
INTRAMUSCULAR | Status: AC
  Filled 2019-03-26: qty 2

## 2019-03-26 MED ORDER — SUCCINYLCHOLINE CHLORIDE 200 MG/10ML IV SOSY
PREFILLED_SYRINGE | INTRAVENOUS | Status: DC | PRN
  Administered 2019-03-26: 100 mg via INTRAVENOUS

## 2019-03-26 SURGICAL SUPPLY — 58 items
BANDAGE ACE 6X5 VEL STRL LF (GAUZE/BANDAGES/DRESSINGS) ×9 IMPLANT
BLADE CLIPPER SURG (BLADE) IMPLANT
BLADE DERMATONE (BLADE) ×3 IMPLANT
BNDG COHESIVE 4X5 TAN STRL (GAUZE/BANDAGES/DRESSINGS) IMPLANT
BNDG ELASTIC 6X10 VLCR STRL LF (GAUZE/BANDAGES/DRESSINGS) ×3 IMPLANT
BNDG GAUZE ELAST 4 BULKY (GAUZE/BANDAGES/DRESSINGS) IMPLANT
BRUSH SCRUB SURG 4.25 DISP (MISCELLANEOUS) ×6 IMPLANT
CANISTER SUCT 3000ML PPV (MISCELLANEOUS) IMPLANT
CANISTER WOUNDNEG PRESSURE 500 (CANNISTER) ×3 IMPLANT
COTTONBALL LRG STERILE PKG (GAUZE/BANDAGES/DRESSINGS) ×6 IMPLANT
COVER SURGICAL LIGHT HANDLE (MISCELLANEOUS) ×3 IMPLANT
COVER WAND RF STERILE (DRAPES) ×3 IMPLANT
DERMACARRIERS GRAFT 1 TO 1.5 (DISPOSABLE) ×3
DRAPE EXTREMITY BILATERAL (DRAPES) ×3 IMPLANT
DRAPE HALF SHEET 40X57 (DRAPES) ×6 IMPLANT
DRESSING VERAFLO CLEANSE CC (GAUZE/BANDAGES/DRESSINGS) ×1 IMPLANT
DRSG ADAPTIC 3X8 NADH LF (GAUZE/BANDAGES/DRESSINGS) ×42 IMPLANT
DRSG CLEANSE VERAFLOW (GAUZE/BANDAGES/DRESSINGS) ×6 IMPLANT
DRSG PAD ABDOMINAL 8X10 ST (GAUZE/BANDAGES/DRESSINGS) ×3 IMPLANT
DRSG VERAFLO CLEANSE CC (GAUZE/BANDAGES/DRESSINGS) ×3
ELECT REM PT RETURN 9FT ADLT (ELECTROSURGICAL)
ELECTRODE REM PT RTRN 9FT ADLT (ELECTROSURGICAL) IMPLANT
GAUZE 4X4 16PLY RFD (DISPOSABLE) ×3 IMPLANT
GAUZE SPONGE 4X4 12PLY STRL (GAUZE/BANDAGES/DRESSINGS) ×6 IMPLANT
GAUZE XEROFORM 5X9 LF (GAUZE/BANDAGES/DRESSINGS) ×9 IMPLANT
GLOVE BIO SURGEON STRL SZ7.5 (GLOVE) ×3 IMPLANT
GLOVE BIO SURGEON STRL SZ8 (GLOVE) ×3 IMPLANT
GLOVE BIOGEL PI IND STRL 7.5 (GLOVE) ×1 IMPLANT
GLOVE BIOGEL PI IND STRL 8 (GLOVE) ×1 IMPLANT
GLOVE BIOGEL PI INDICATOR 7.5 (GLOVE) ×2
GLOVE BIOGEL PI INDICATOR 8 (GLOVE) ×2
GOWN STRL REUS W/ TWL LRG LVL3 (GOWN DISPOSABLE) ×2 IMPLANT
GOWN STRL REUS W/ TWL XL LVL3 (GOWN DISPOSABLE) ×1 IMPLANT
GOWN STRL REUS W/TWL LRG LVL3 (GOWN DISPOSABLE) ×4
GOWN STRL REUS W/TWL XL LVL3 (GOWN DISPOSABLE) ×2
GRAFT DERMACARRIERS 1 TO 1.5 (DISPOSABLE) ×1 IMPLANT
HANDPIECE INTERPULSE COAX TIP (DISPOSABLE)
KIT BASIN OR (CUSTOM PROCEDURE TRAY) ×3 IMPLANT
KIT TURNOVER KIT B (KITS) ×3 IMPLANT
MANIFOLD NEPTUNE II (INSTRUMENTS) IMPLANT
NS IRRIG 1000ML POUR BTL (IV SOLUTION) ×3 IMPLANT
PACK GENERAL/GYN (CUSTOM PROCEDURE TRAY) ×3 IMPLANT
PACK ORTHO EXTREMITY (CUSTOM PROCEDURE TRAY) ×3 IMPLANT
PAD ARMBOARD 7.5X6 YLW CONV (MISCELLANEOUS) ×6 IMPLANT
PAD CAST 4YDX4 CTTN HI CHSV (CAST SUPPLIES) ×1 IMPLANT
PAD NEG PRESSURE SENSATRAC (MISCELLANEOUS) ×6 IMPLANT
PADDING CAST COTTON 4X4 STRL (CAST SUPPLIES) ×2
PADDING CAST COTTON 6X4 STRL (CAST SUPPLIES) ×3 IMPLANT
SET HNDPC FAN SPRY TIP SCT (DISPOSABLE) IMPLANT
SPONGE LAP 18X18 RF (DISPOSABLE) ×3 IMPLANT
STAPLER VISISTAT (STAPLE) ×3 IMPLANT
STAPLER VISISTAT 35W (STAPLE) ×6 IMPLANT
STOCKINETTE 4X48 STRL (DRAPES) ×3 IMPLANT
STOCKINETTE IMPERVIOUS LG (DRAPES) IMPLANT
SUT ETHILON 3 0 FSL (SUTURE) IMPLANT
TOWEL OR 17X26 10 PK STRL BLUE (TOWEL DISPOSABLE) ×6 IMPLANT
UNDERPAD 30X30 (UNDERPADS AND DIAPERS) ×3 IMPLANT
WATER STERILE IRR 1000ML POUR (IV SOLUTION) ×3 IMPLANT

## 2019-03-26 NOTE — Progress Notes (Signed)
I discussed with the patient's wife the indications and the risks and benefits of surgery for Travis Benson's open right leg wounds following fasciotomies, including the possibility of infection, nerve injury, vessel injury, wound breakdown, arthritis, graft failure, DVT/ PE, loss of motion, and need for further surgery among others.  We also specifically discussed the elevated risk of soft tissue breakdown that could lead to amputation. Shee acknowledged these risks and wished to proceed.  Myrene Galas, MD [facility], [facility] [phone]

## 2019-03-26 NOTE — Progress Notes (Signed)
Pt is on D8 of cefepime for MSSA PNA. Secretions are better per Dr. Janee Morn and ok to add stop date of 10d for cefepime.  Ulyses Southward, PharmD, BCIDP, AAHIVP, CPP Infectious Disease Pharmacist 03/26/2019 10:18 AM
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
## 2019-03-26 NOTE — Transfer of Care (Signed)
Immediate Anesthesia Transfer of Care Note  Patient: Travis Benson   Procedure(s) Performed: SKIN GRAFT SPLIT THICKNESS (Right Leg Lower)  Patient Location: PACU  Anesthesia Type:General  Level of Consciousness: awake, alert  and oriented  Airway & Oxygen Therapy: Patient Spontanous Breathing and Patient connected to nasal cannula oxygen  Post-op Assessment: Report given to RN and Post -op Vital signs reviewed and stable  Post vital signs: Reviewed and stable  Last Vitals:  Vitals Value Taken Time  BP 137/61 03/26/2019  2:56 PM  Temp 36.8 C 03/26/2019  2:56 PM  Pulse 100 03/26/2019  3:01 PM  Resp 23 03/26/2019  3:01 PM  SpO2 98 % 03/26/2019  3:01 PM  Vitals shown include unvalidated device data.  Last Pain:  Vitals:   03/26/19 0826  TempSrc:   PainSc: 7       Patients Stated Pain Goal: 2 (03/22/19 2000)  Complications: No apparent anesthesia complications

## 2019-03-26 NOTE — Progress Notes (Signed)
SLP Cancellation Note  Patient Details Name: Travis Benson MRN: [id number] DOB: 04-Nov-1961   Cancelled treatment:        Pt in surgery. Will continue efforts for swallow and cognitive therapy.   Royce Macadamia 03/26/2019, 11:41 AM  Breck Coons Lonell Face.Ed Nurse, children's [phone] Office [phone]
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
## 2019-03-26 NOTE — Consult Note (Signed)
WOC will omit NPWT dressing change today, patient scheduled to go to OR later today for STSG.   WOC nurse team will follow along should NPWT dressing changes be continued after surgery.   Owyn Raulston [facility], CNS, [employer] [phone]
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
## 2019-03-26 NOTE — Progress Notes (Signed)
Report received from Kitt[place], RN 4N.

## 2019-03-26 NOTE — Anesthesia Procedure Notes (Signed)
Procedure Name: Intubation Date/Time: 03/26/2019 12:28 PM Performed by: Nils Pyle, CRNA Pre-anesthesia Checklist: Patient identified, Emergency Drugs available, Suction available and Patient being monitored Patient Re-evaluated:Patient Re-evaluated prior to induction Oxygen Delivery Method: Circle System Utilized Preoxygenation: Pre-oxygenation with 100% oxygen Induction Type: IV induction and Rapid sequence Laryngoscope Size: Miller and 2 Grade View: Grade II Tube type: Oral Tube size: 7.5 mm Number of attempts: 1 Airway Equipment and Method: Stylet and Oral airway Placement Confirmation: ETT inserted through vocal cords under direct vision,  positive ETCO2 and breath sounds checked- equal and bilateral Secured at: 23 cm Tube secured with: Tape Dental Injury: Teeth and Oropharynx as per pre-operative assessment
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
## 2019-03-26 NOTE — Anesthesia Postprocedure Evaluation (Signed)
Anesthesia Post Note  Patient: Travis Benson  Procedure(s) Performed: SKIN GRAFT SPLIT THICKNESS (Right Leg Lower)     Patient location during evaluation: PACU Anesthesia Type: General Level of consciousness: awake and alert Pain management: pain level controlled Vital Signs Assessment: post-procedure vital signs reviewed and stable Respiratory status: spontaneous breathing, nonlabored ventilation, respiratory function stable and patient connected to nasal cannula oxygen Cardiovascular status: blood pressure returned to baseline and stable Postop Assessment: no apparent nausea or vomiting Anesthetic complications: no    Last Vitals:  Vitals:   03/26/19 1555 03/26/19 1600  BP: (!) 158/95 (!) 150/97  Pulse: (!) 103 (!) 102  Resp: 20 20  Temp: 36.9 C   SpO2: 96% 98%    Last Pain:  Vitals:   03/26/19 1600  TempSrc:   PainSc: 9                  Kennieth Rad

## 2019-03-26 NOTE — Progress Notes (Signed)
Pt off the unit for sx.

## 2019-03-27 ENCOUNTER — Encounter (HOSPITAL_COMMUNITY): Payer: Self-pay | Admitting: Orthopedic Surgery

## 2019-03-27 LAB — CBC
HEMATOCRIT: 30.7 % — AB (ref 39.0–52.0)
Hemoglobin: 9.7 g/dL — ABNORMAL LOW (ref 13.0–17.0)
MCH: 29.4 pg (ref 26.0–34.0)
MCHC: 31.6 g/dL (ref 30.0–36.0)
MCV: 93 fL (ref 80.0–100.0)
PLATELETS: 693 10*3/uL — AB (ref 150–400)
RBC: 3.3 MIL/uL — ABNORMAL LOW (ref 4.22–5.81)
RDW: 15.4 % (ref 11.5–15.5)
WBC: 17.2 10*3/uL — ABNORMAL HIGH (ref 4.0–10.5)
nRBC: 0 % (ref 0.0–0.2)

## 2019-03-27 LAB — BASIC METABOLIC PANEL
Anion gap: 11 (ref 5–15)
BUN: 39 mg/dL — ABNORMAL HIGH (ref 6–20)
CO2: 21 mmol/L — ABNORMAL LOW (ref 22–32)
Calcium: 8.2 mg/dL — ABNORMAL LOW (ref 8.9–10.3)
Chloride: 105 mmol/L (ref 98–111)
Creatinine, Ser: 1.08 mg/dL (ref 0.61–1.24)
GFR calc Af Amer: >60 mL/min (ref >60)
GFR calc non Af Amer: >60 mL/min (ref >60)
Glucose, Bld: 155 mg/dL — ABNORMAL HIGH (ref 70–99)
Potassium: 4.2 mmol/L (ref 3.5–5.1)
Sodium: 137 mmol/L (ref 135–145)

## 2019-03-27 LAB — GLUCOSE, CAPILLARY
Glucose-Capillary: 141 mg/dL — ABNORMAL HIGH (ref 70–99)
Glucose-Capillary: 133 mg/dL — ABNORMAL HIGH (ref 70–99)
Glucose-Capillary: 160 mg/dL — ABNORMAL HIGH (ref 70–99)
Glucose-Capillary: 142 mg/dL — ABNORMAL HIGH (ref 70–99)
Glucose-Capillary: 150 mg/dL — ABNORMAL HIGH (ref 70–99)
Glucose-Capillary: 151 mg/dL — ABNORMAL HIGH (ref 70–99)

## 2019-03-27 MED ORDER — ALTEPLASE 2 MG IJ SOLR
2.0000 mg | Freq: Once | INTRAMUSCULAR | Status: AC
  Administered 2019-03-27: 2 mg
  Filled 2019-03-27: qty 2
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
## 2019-03-27 NOTE — Progress Notes (Signed)
Orthopaedic Trauma Service (OTS)  1 Day Post-Op Procedure(s) (LRB): SKIN GRAFT SPLIT THICKNESS (Right)  Subjective: Patient reports pain as moderate.  Not much communication.  Objective: Current Vitals Blood pressure (!) 156/76, pulse (!) 101, temperature 98.4 F (36.9 C), temperature source Oral, resp. rate (!) 21, height 6' (1.829 m), weight 114.1 kg, SpO2 98 %. Vital signs in last 24 hours: Temp:  [98.3 F (36.8 C)-98.9 F (37.2 C)] 98.4 F (36.9 C) (03/28 0800) Pulse Rate:  [100-103] 101 (03/28 0800) Resp:  [20-23] 21 (03/28 0800) BP: (137-158)/(42-97) 156/76 (03/28 0800) SpO2:  [95 %-98 %] 98 % (03/28 0800)  Intake/Output from previous day: 03/27 0701 - 03/28 0700 In: 1736.7 [I.V.:1196.7; IV Piggyback:540.1] Out: 1325 [Urine:1075; Drains:100; Stool:100; Blood:50]  LABS Recent Labs    03/25/19 0427 03/27/19 0709  HGB 9.9* 9.7*   Recent Labs    03/25/19 0427 03/27/19 0709  WBC 16.5* 17.2*  RBC 3.38* 3.30*  HCT 31.8* 30.7*  PLT 748* 693*   Recent Labs    03/25/19 0427 03/27/19 0709  NA 136 137  K 4.2 4.2  CL 107 105  CO2 25 21*  BUN 32* 39*  CREATININE 0.96 1.08  GLUCOSE 146* 155*  CALCIUM 8.3* 8.2*   No results for input(s): LABPT, INR in the last 72 hours.   Physical Exam R&LLE  Dressing intact, clean, dry; donor site drsg removed  Edema/ swelling controlled  Vac holding  No NV changes  Brisk cap refill, warm to touch  Assessment/Plan: 1 Day Post-Op Procedure(s) (LRB): SKIN GRAFT SPLIT THICKNESS (Right) 1. PT/OT  2. DVT proph Other (comment) per Trauma 3. Will remove right grafting site drsg on Monday or Tues  Myrene Galas, MD [facility] [phone] (p)

## 2019-03-28 LAB — BASIC METABOLIC PANEL
Anion gap: 11 (ref 5–15)
BUN: 34 mg/dL — ABNORMAL HIGH (ref 6–20)
CO2: 22 mmol/L (ref 22–32)
Calcium: 8.5 mg/dL — ABNORMAL LOW (ref 8.9–10.3)
Chloride: 102 mmol/L (ref 98–111)
Creatinine, Ser: 0.86 mg/dL (ref 0.61–1.24)
GFR calc non Af Amer: >60 mL/min (ref >60)
Glucose, Bld: 146 mg/dL — ABNORMAL HIGH (ref 70–99)
Potassium: 4.1 mmol/L (ref 3.5–5.1)
Sodium: 135 mmol/L (ref 135–145)

## 2019-03-28 LAB — CBC
HEMATOCRIT: 32.2 % — AB (ref 39.0–52.0)
Hemoglobin: 9.8 g/dL — ABNORMAL LOW (ref 13.0–17.0)
MCH: 28.7 pg (ref 26.0–34.0)
MCHC: 30.4 g/dL (ref 30.0–36.0)
MCV: 94.4 fL (ref 80.0–100.0)
Platelets: 684 10*3/uL — ABNORMAL HIGH (ref 150–400)
RBC: 3.41 MIL/uL — ABNORMAL LOW (ref 4.22–5.81)
RDW: 15.7 % — ABNORMAL HIGH (ref 11.5–15.5)
WBC: 13.8 10*3/uL — AB (ref 4.0–10.5)
nRBC: 0 % (ref 0.0–0.2)

## 2019-03-28 LAB — GLUCOSE, CAPILLARY
Glucose-Capillary: 129 mg/dL — ABNORMAL HIGH (ref 70–99)
Glucose-Capillary: 133 mg/dL — ABNORMAL HIGH (ref 70–99)
Glucose-Capillary: 144 mg/dL — ABNORMAL HIGH (ref 70–99)
Glucose-Capillary: 147 mg/dL — ABNORMAL HIGH (ref 70–99)
Glucose-Capillary: 152 mg/dL — ABNORMAL HIGH (ref 70–99)
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
## 2019-03-29 LAB — GLUCOSE, CAPILLARY
GLUCOSE-CAPILLARY: 119 mg/dL — AB (ref 70–99)
GLUCOSE-CAPILLARY: 144 mg/dL — AB (ref 70–99)
Glucose-Capillary: 126 mg/dL — ABNORMAL HIGH (ref 70–99)
Glucose-Capillary: 128 mg/dL — ABNORMAL HIGH (ref 70–99)
Glucose-Capillary: 134 mg/dL — ABNORMAL HIGH (ref 70–99)
Glucose-Capillary: 148 mg/dL — ABNORMAL HIGH (ref 70–99)
Glucose-Capillary: 149 mg/dL — ABNORMAL HIGH (ref 70–99)
Glucose-Capillary: 152 mg/dL — ABNORMAL HIGH (ref 70–99)
Glucose-Capillary: 159 mg/dL — ABNORMAL HIGH (ref 70–99)
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
## 2019-03-29 NOTE — Consult Note (Signed)
WOC Nurse wound consult note I reached out to Montez Morita, PA, via office staff relaying of messages, to determine if the WOC team was needed for [facility] therapy.  I learned that the plan is for the medical team to remove the [facility] and that the WOC is no longer needed.  I am removing the patient from our f/u list and cancelling the order for WOC nurses to change. Helmut Muster, RN, MSN, CWOCN, CNS-BC, pager [phone]
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
## 2019-03-30 LAB — CBC
HCT: 34.2 % — ABNORMAL LOW (ref 39.0–52.0)
Hemoglobin: 10.7 g/dL — ABNORMAL LOW (ref 13.0–17.0)
MCH: 29.7 pg (ref 26.0–34.0)
MCHC: 31.3 g/dL (ref 30.0–36.0)
MCV: 95 fL (ref 80.0–100.0)
Platelets: 737 10*3/uL — ABNORMAL HIGH (ref 150–400)
RBC: 3.6 MIL/uL — ABNORMAL LOW (ref 4.22–5.81)
RDW: 16 % — ABNORMAL HIGH (ref 11.5–15.5)
WBC: 15.2 10*3/uL — ABNORMAL HIGH (ref 4.0–10.5)
nRBC: 0 % (ref 0.0–0.2)

## 2019-03-30 LAB — BASIC METABOLIC PANEL
Anion gap: 11 (ref 5–15)
BUN: 33 mg/dL — ABNORMAL HIGH (ref 6–20)
CO2: 23 mmol/L (ref 22–32)
Calcium: 8.9 mg/dL (ref 8.9–10.3)
Chloride: 102 mmol/L (ref 98–111)
Creatinine, Ser: 0.89 mg/dL (ref 0.61–1.24)
GFR calc Af Amer: >60 mL/min (ref >60)
Glucose, Bld: 136 mg/dL — ABNORMAL HIGH (ref 70–99)
Potassium: 4.5 mmol/L (ref 3.5–5.1)
Sodium: 136 mmol/L (ref 135–145)

## 2019-03-30 LAB — GLUCOSE, CAPILLARY
Glucose-Capillary: 148 mg/dL — ABNORMAL HIGH (ref 70–99)
Glucose-Capillary: 139 mg/dL — ABNORMAL HIGH (ref 70–99)
Glucose-Capillary: 173 mg/dL — ABNORMAL HIGH (ref 70–99)
Glucose-Capillary: 128 mg/dL — ABNORMAL HIGH (ref 70–99)
Glucose-Capillary: 136 mg/dL — ABNORMAL HIGH (ref 70–99)
Glucose-Capillary: 144 mg/dL — ABNORMAL HIGH (ref 70–99)

## 2019-03-30 MED ORDER — METOPROLOL TARTRATE 25 MG/10 ML ORAL SUSPENSION
50.0000 mg | Freq: Two times a day (BID) | ORAL | Status: DC
  Administered 2019-03-30 – 2019-04-01 (×6): 50 mg
  Filled 2019-03-30 (×7): qty 20

## 2019-03-30 MED ORDER — RESOURCE THICKENUP CLEAR PO POWD
ORAL | Status: DC | PRN
  Filled 2019-03-30: qty 125
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
## 2019-03-30 NOTE — Progress Notes (Signed)
Per therapies patient has been passing out when dangled at the edge of bed.   New orders for TED hose, SCDs, and change in lopressor (see MAR).
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
## 2019-03-30 NOTE — Progress Notes (Signed)
Patient passed swallow exam at bedside.  Diet advanced to DYS 1 small bites and sips, FULL SUPERVISION. Still on tube feeds ([employer]trak).
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
## 2019-03-30 NOTE — Progress Notes (Signed)
  Progress Note    03/30/2019 4:34 PM 4 Days Post-Op  Subjective:  More alert today   Vitals:   03/30/19 1125 03/30/19 1517  BP: 116/70 (!) 142/82  Pulse: 93 (!) 102  Resp: 20 20  Temp: 98 F (36.7 C) 97.8 F (36.6 C)  SpO2: 99% 100%    Physical Exam: aaox3 5 staples in right groin Strong dp on right Minimal sensation right foot  CBC    Component Value Date/Time   WBC 15.2 (H) 03/30/2019 0614   RBC 3.60 (L) 03/30/2019 0614   HGB 10.7 (L) 03/30/2019 0614   HCT 34.2 (L) 03/30/2019 0614   PLT 737 (H) 03/30/2019 0614   MCV 95.0 03/30/2019 0614   MCH 29.7 03/30/2019 0614   MCHC 31.3 03/30/2019 0614   RDW 16.0 (H) 03/30/2019 0614   LYMPHSABS 1.0 03/15/2019 0604   MONOABS 1.4 (H) 03/15/2019 0604   EOSABS 0.2 03/15/2019 0604   BASOSABS 0.0 03/15/2019 0604    BMET    Component Value Date/Time   NA 136 03/30/2019 0614   K 4.5 03/30/2019 0614   CL 102 03/30/2019 0614   CO2 23 03/30/2019 0614   GLUCOSE 136 (H) 03/30/2019 0614   BUN 33 (H) 03/30/2019 0614   CREATININE 0.89 03/30/2019 0614   CALCIUM 8.9 03/30/2019 0614   GFRNONAA >60 03/30/2019 0614   GFRAA >60 03/30/2019 0614    INR    Component Value Date/Time   INR 1.1 03/01/2019 1450     Intake/Output Summary (Last 24 hours) at 03/30/2019 1634 Last data filed at 03/30/2019 1500 Gross per 24 hour  Intake 510 ml  Output 1801 ml  Net -1291 ml     Assessment:  58 y.o. male is s/p right sfa-pop bypass for trauma  Plan: Staples out Aspirin 81mg  when taking po   Lean Jaeger C. Randie Heinz, MD [facility] Office: [phone] Pager: [phone]  03/30/2019 4:34 PM
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
## 2019-03-30 NOTE — Progress Notes (Signed)
OT NOTE  OT seeing Pt for foot plate modification for hook application on base of footplate. Removal of all hook and new hook applied with heat. Plate hook checked against patient for proper alignment.    Time spent 8 minutes   Mateo Flow, OTR/L  Acute Rehabilitation Services Pager: [phone] Office: [phone] .   Foot Plate for Right Leg  RN STAFF  Wear Schedule: Foot Plate to be worn for 4 hours Twice a day - once in morning (8am-12pm) and once in afternoon (2pm-6pm) AND at night when patient is sleeping  Please remove splint check skin every 4 hours during shift to assess for: * pain * redness *swelling  If any symptoms above present remove splint for 15 minutes. If symptoms continue - keep the splint removed and notify OT staff [phone] immediately.   Keep the RLE elevated at all times on pillows / towels.  Splint can be cleaned with warm soapy water and alcohol swab. Splint should not be placed in heat of any kind because the splint with mold into a new shape.
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
## 2019-03-31 LAB — GLUCOSE, CAPILLARY
Glucose-Capillary: 143 mg/dL — ABNORMAL HIGH (ref 70–99)
Glucose-Capillary: 136 mg/dL — ABNORMAL HIGH (ref 70–99)
Glucose-Capillary: 114 mg/dL — ABNORMAL HIGH (ref 70–99)
Glucose-Capillary: 149 mg/dL — ABNORMAL HIGH (ref 70–99)
Glucose-Capillary: 123 mg/dL — ABNORMAL HIGH (ref 70–99)

## 2019-03-31 LAB — CBC
HCT: 35 % — ABNORMAL LOW (ref 39.0–52.0)
Hemoglobin: 10.8 g/dL — ABNORMAL LOW (ref 13.0–17.0)
MCH: 29 pg (ref 26.0–34.0)
MCHC: 30.9 g/dL (ref 30.0–36.0)
MCV: 94.1 fL (ref 80.0–100.0)
Platelets: 687 10*3/uL — ABNORMAL HIGH (ref 150–400)
RBC: 3.72 MIL/uL — ABNORMAL LOW (ref 4.22–5.81)
RDW: 15.9 % — ABNORMAL HIGH (ref 11.5–15.5)
WBC: 16.5 10*3/uL — ABNORMAL HIGH (ref 4.0–10.5)
nRBC: 0 % (ref 0.0–0.2)

## 2019-03-31 MED ORDER — PIVOT 1.5 CAL PO LIQD
1000.0000 mL | ORAL | Status: DC
  Administered 2019-03-31: 14:00:00 1000 mL
  Filled 2019-03-31 (×3): qty 1000

## 2019-03-31 MED ORDER — ASPIRIN EC 81 MG PO TBEC
81.0000 mg | DELAYED_RELEASE_TABLET | Freq: Every day | ORAL | Status: DC
  Administered 2019-03-31 – 2019-04-28 (×27): 81 mg via ORAL
  Filled 2019-03-31 (×27): qty 1

## 2019-03-31 MED ORDER — ENSURE ENLIVE PO LIQD
237.0000 mL | Freq: Two times a day (BID) | ORAL | Status: DC
  Administered 2019-04-02 – 2019-04-28 (×34): 237 mL via ORAL
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
## 2019-03-31 NOTE — Progress Notes (Signed)
  Progress Note    03/31/2019 11:34 AM 5 Days Post-Op  Subjective: Feeling better today, improved sensation right foot  Vitals:   03/31/19 0400 03/31/19 0756  BP: (!) 148/82 (!) 149/84  Pulse: (!) 108 (!) 114  Resp: 17 20  Temp: (!) 97.5 F (36.4 C) 98 F (36.7 C)  SpO2: 99% 97%    Physical Exam: Awake alert and oriented Nonlabored respirations Right lower extremity above-knee incisions healing well with staples out Strongly palpable dorsalis pedis on the right Has minimal sensation right dorsal foot  CBC    Component Value Date/Time   WBC 15.2 (H) 03/30/2019 0614   RBC 3.60 (L) 03/30/2019 0614   HGB 10.7 (L) 03/30/2019 0614   HCT 34.2 (L) 03/30/2019 0614   PLT 737 (H) 03/30/2019 0614   MCV 95.0 03/30/2019 0614   MCH 29.7 03/30/2019 0614   MCHC 31.3 03/30/2019 0614   RDW 16.0 (H) 03/30/2019 0614   LYMPHSABS 1.0 03/15/2019 0604   MONOABS 1.4 (H) 03/15/2019 0604   EOSABS 0.2 03/15/2019 0604   BASOSABS 0.0 03/15/2019 0604    BMET    Component Value Date/Time   NA 136 03/30/2019 0614   K 4.5 03/30/2019 0614   CL 102 03/30/2019 0614   CO2 23 03/30/2019 0614   GLUCOSE 136 (H) 03/30/2019 0614   BUN 33 (H) 03/30/2019 0614   CREATININE 0.89 03/30/2019 0614   CALCIUM 8.9 03/30/2019 0614   GFRNONAA >60 03/30/2019 0614   GFRAA >60 03/30/2019 0614    INR    Component Value Date/Time   INR 1.1 03/01/2019 1450     Intake/Output Summary (Last 24 hours) at 03/31/2019 1134 Last data filed at 03/31/2019 0947 Gross per 24 hour  Intake 2560 ml  Output 1981 ml  Net 579 ml     Assessment/plan:  59 y.o. male is s/p bypass for trauma with likely nerve injury with some sensation in his foot.  He is skin grafts on his fasciotomy sites strongly palpable dorsalis pedis.  He should have aspirin when he can take p.o.  I will see him in 6 weeks with right lower extremity duplex.  If there are issues prior to this please contact vascular surgery on-call.     Phenix Grein C.  Randie Heinz, MD [facility] Office: [phone] Pager: [phone]  03/31/2019 11:34 AM
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
## 2019-04-01 ENCOUNTER — Encounter (HOSPITAL_COMMUNITY): Payer: Self-pay

## 2019-04-01 LAB — CBC
HCT: 34.5 % — ABNORMAL LOW (ref 39.0–52.0)
Hemoglobin: 10.7 g/dL — ABNORMAL LOW (ref 13.0–17.0)
MCH: 29 pg (ref 26.0–34.0)
MCHC: 31 g/dL (ref 30.0–36.0)
MCV: 93.5 fL (ref 80.0–100.0)
Platelets: 599 10*3/uL — ABNORMAL HIGH (ref 150–400)
RBC: 3.69 MIL/uL — ABNORMAL LOW (ref 4.22–5.81)
RDW: 15.6 % — ABNORMAL HIGH (ref 11.5–15.5)
WBC: 12.5 10*3/uL — ABNORMAL HIGH (ref 4.0–10.5)
nRBC: 0 % (ref 0.0–0.2)

## 2019-04-01 LAB — GLUCOSE, CAPILLARY
Glucose-Capillary: 110 mg/dL — ABNORMAL HIGH (ref 70–99)
Glucose-Capillary: 127 mg/dL — ABNORMAL HIGH (ref 70–99)
Glucose-Capillary: 129 mg/dL — ABNORMAL HIGH (ref 70–99)
Glucose-Capillary: 132 mg/dL — ABNORMAL HIGH (ref 70–99)
Glucose-Capillary: 133 mg/dL — ABNORMAL HIGH (ref 70–99)
Glucose-Capillary: 136 mg/dL — ABNORMAL HIGH (ref 70–99)
Glucose-Capillary: 147 mg/dL — ABNORMAL HIGH (ref 70–99)
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
## 2019-04-01 NOTE — Progress Notes (Signed)
SLP Cancellation Note  Patient Details Name: Travis Benson MRN: [id number] DOB: June 16, 1961   Cancelled treatment:       Reason Eval/Treat Not Completed: Patient declined, no reason specified. Pt reported that he is not feeling up for eating right now. SLP will re-attempt as able.   Travis Dehaas I. Vear Clock, MS, CCC-SLP Acute Rehabilitation Services Office number [phone] Pager [phone]  [name] 04/01/2019, 12:19 PM
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
## 2019-04-02 LAB — GLUCOSE, CAPILLARY
Glucose-Capillary: 131 mg/dL — ABNORMAL HIGH (ref 70–99)
Glucose-Capillary: 134 mg/dL — ABNORMAL HIGH (ref 70–99)
Glucose-Capillary: 141 mg/dL — ABNORMAL HIGH (ref 70–99)
Glucose-Capillary: 113 mg/dL — ABNORMAL HIGH (ref 70–99)
Glucose-Capillary: 117 mg/dL — ABNORMAL HIGH (ref 70–99)

## 2019-04-02 LAB — CBC
HCT: 32.9 % — ABNORMAL LOW (ref 39.0–52.0)
Hemoglobin: 10.6 g/dL — ABNORMAL LOW (ref 13.0–17.0)
MCH: 29.9 pg (ref 26.0–34.0)
MCHC: 32.2 g/dL (ref 30.0–36.0)
MCV: 92.9 fL (ref 80.0–100.0)
Platelets: 614 10*3/uL — ABNORMAL HIGH (ref 150–400)
RBC: 3.54 MIL/uL — ABNORMAL LOW (ref 4.22–5.81)
RDW: 15.5 % (ref 11.5–15.5)
WBC: 10.5 10*3/uL (ref 4.0–10.5)
nRBC: 0 % (ref 0.0–0.2)

## 2019-04-02 LAB — BASIC METABOLIC PANEL
Anion gap: 11 (ref 5–15)
BUN: 27 mg/dL — ABNORMAL HIGH (ref 6–20)
CO2: 23 mmol/L (ref 22–32)
Calcium: 8.6 mg/dL — ABNORMAL LOW (ref 8.9–10.3)
Chloride: 99 mmol/L (ref 98–111)
Creatinine, Ser: 0.88 mg/dL (ref 0.61–1.24)
GFR calc Af Amer: >60 mL/min (ref >60)
GFR calc non Af Amer: >60 mL/min (ref >60)
Glucose, Bld: 139 mg/dL — ABNORMAL HIGH (ref 70–99)
Potassium: 4.2 mmol/L (ref 3.5–5.1)
Sodium: 133 mmol/L — ABNORMAL LOW (ref 135–145)

## 2019-04-02 MED ORDER — CEFAZOLIN SODIUM-DEXTROSE 1-4 GM/50ML-% IV SOLN
1.0000 g | Freq: Three times a day (TID) | INTRAVENOUS | Status: DC
  Administered 2019-04-02 – 2019-04-08 (×18): 1 g via INTRAVENOUS
  Filled 2019-04-02 (×20): qty 50

## 2019-04-02 MED ORDER — METOPROLOL TARTRATE 25 MG PO TABS
25.0000 mg | ORAL_TABLET | Freq: Two times a day (BID) | ORAL | Status: DC
  Administered 2019-04-02 – 2019-04-28 (×53): 25 mg via ORAL
  Filled 2019-04-02 (×53): qty 1

## 2019-04-02 MED ORDER — ACETAMINOPHEN 325 MG PO TABS
650.0000 mg | ORAL_TABLET | Freq: Four times a day (QID) | ORAL | Status: DC
  Administered 2019-04-02 – 2019-04-09 (×21): 650 mg via ORAL
  Filled 2019-04-02 (×30): qty 2

## 2019-04-02 MED ORDER — OXYCODONE HCL 5 MG PO TABS
5.0000 mg | ORAL_TABLET | ORAL | Status: DC | PRN
  Administered 2019-04-06 – 2019-04-08 (×3): 10 mg via ORAL
  Administered 2019-04-09: 06:00:00 5 mg via ORAL
  Administered 2019-04-10 – 2019-04-17 (×18): 10 mg via ORAL
  Administered 2019-04-17: 5 mg via ORAL
  Administered 2019-04-17: 10 mg via ORAL
  Administered 2019-04-17: 5 mg via ORAL
  Administered 2019-04-18 – 2019-04-28 (×34): 10 mg via ORAL
  Filled 2019-04-02 (×22): qty 2
  Filled 2019-04-02: qty 1
  Filled 2019-04-02 (×3): qty 2
  Filled 2019-04-02: qty 1
  Filled 2019-04-02 (×2): qty 2
  Filled 2019-04-02: qty 1
  Filled 2019-04-02 (×29): qty 2

## 2019-04-02 MED ORDER — PRO-STAT SUGAR FREE PO LIQD
30.0000 mL | Freq: Three times a day (TID) | ORAL | Status: DC
  Administered 2019-04-02 – 2019-04-07 (×9): 30 mL
  Filled 2019-04-02 (×12): qty 30

## 2019-04-02 MED ORDER — BOOST / RESOURCE BREEZE PO LIQD CUSTOM
1.0000 | Freq: Two times a day (BID) | ORAL | Status: DC
  Administered 2019-04-03 – 2019-04-05 (×5): 1 via ORAL

## 2019-04-02 NOTE — Progress Notes (Signed)
SLP Cancellation Note  Patient Details Name: Travis Benson MRN: [id number] DOB: Jan 09, 1961   Cancelled treatment:       Reason Eval/Treat Not Completed: Patient declined, no reason specified. Pt requested that SLP treatment be deferred since he is currently fatigued from working with physical therapy. SLP will re-attempt as able.   Romuald Mccaslin I. Vear Clock, MS, CCC-SLP Acute Rehabilitation Services Office number [phone] Pager [phone]  [name] 04/02/2019, 11:51 AM
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
## 2019-04-02 NOTE — Progress Notes (Signed)
Orthopaedic Trauma Service (OTS)  7 Days Post-Op Procedure(s) (LRB): SKIN GRAFT SPLIT THICKNESS (Right)  Subjective: Patient reports pain as mild.  Patient is open to considering an amputation for function on the right side.  Objective: Current Vitals Blood pressure 140/78, pulse (!) 105, temperature 98.2 F (36.8 C), temperature source Oral, resp. rate 20, height 6' (1.829 m), weight 102.9 kg, SpO2 100 %. Vital signs in last 24 hours: Temp:  [98 F (36.7 C)-98.8 F (37.1 C)] 98.2 F (36.8 C) (04/03 0906) Pulse Rate:  [95-110] 105 (04/03 0906) Resp:  [15-24] 20 (04/03 0906) BP: (125-155)/(67-96) 140/78 (04/03 0906) SpO2:  [98 %-100 %] 100 % (04/03 0906) Weight:  [102.9 kg] 102.9 kg (04/03 0347)  Intake/Output from previous day: 04/02 0701 - 04/03 0700 In: 1030 [P.O.:240; NG/GT:790] Out: 1050 [Urine:1050]  LABS Recent Labs    03/31/19 1620 04/01/19 0500 04/02/19 0545  HGB 10.8* 10.7* 10.6*   Recent Labs    04/01/19 0500 04/02/19 0545  WBC 12.5* 10.5  RBC 3.69* 3.54*  HCT 34.5* 32.9*  PLT 599* 614*   Recent Labs    04/02/19 0804  NA 133*  K 4.2  CL 99  CO2 23  BUN 27*  CREATININE 0.88  GLUCOSE 139*  CALCIUM 8.6*   No results for input(s): LABPT, INR in the last 72 hours.   Physical Exam RLE some purulence around anterior edge at level of tibial pins and at proximal posterior corner on the medial side; lateral looks excellent; no motor function  LLE Donor site dressing trimmed today  Most posterior donor site dressing is macerated  Fan applied to dry out  Edema/ swelling controlled  Sens: DPN, SPN, TN intact  Motor: EHL, FHL, and lessor toe ext and flex all intact grossly  Brisk cap refill, warm to touch  Assessment/Plan: 7 Days Post-Op Procedure(s) (LRB): SKIN GRAFT SPLIT THICKNESS (Right) 1. PT/OT  2. DVT proph Other (comment) per Trauma Service 3. Loel Dubonnet on donor site today 4. Begin daily WTD dressings on medial side 5. Start  Ancef  Myrene Galas, MD [facility] [phone] (p)
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
## 2019-04-03 LAB — GLUCOSE, CAPILLARY
Glucose-Capillary: 124 mg/dL — ABNORMAL HIGH (ref 70–99)
Glucose-Capillary: 133 mg/dL — ABNORMAL HIGH (ref 70–99)
Glucose-Capillary: 93 mg/dL (ref 70–99)
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
## 2019-04-04 LAB — GLUCOSE, CAPILLARY
Glucose-Capillary: 134 mg/dL — ABNORMAL HIGH (ref 70–99)
Glucose-Capillary: 128 mg/dL — ABNORMAL HIGH (ref 70–99)
Glucose-Capillary: 119 mg/dL — ABNORMAL HIGH (ref 70–99)
Glucose-Capillary: 95 mg/dL (ref 70–99)
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
## 2019-04-04 NOTE — Progress Notes (Signed)
Wet to dry dressing to right lower medial site wih small amount of yellow drainage. Wound bed with some open darken red bed areas and no necrotic areas noted. Wrapped leg with gauze dsg. Patient tolerated well stating " I feel no pain."

## 2019-04-04 NOTE — Progress Notes (Signed)
Patient resting well with VSS and patient stating he does not need blankets for feeling cold.

## 2019-04-05 ENCOUNTER — Inpatient Hospital Stay (HOSPITAL_COMMUNITY): Payer: Medicaid Other

## 2019-04-05 LAB — URINALYSIS, ROUTINE W REFLEX MICROSCOPIC
Bacteria, UA: NONE SEEN
Bilirubin Urine: NEGATIVE
Glucose, UA: NEGATIVE mg/dL
Ketones, ur: NEGATIVE mg/dL
Leukocytes,Ua: NEGATIVE
Nitrite: NEGATIVE
Protein, ur: 30 mg/dL — AB
Specific Gravity, Urine: 1.021 (ref 1.005–1.030)
pH: 5 (ref 5.0–8.0)

## 2019-04-05 LAB — GLUCOSE, CAPILLARY
Glucose-Capillary: 105 mg/dL — ABNORMAL HIGH (ref 70–99)
Glucose-Capillary: 135 mg/dL — ABNORMAL HIGH (ref 70–99)
Glucose-Capillary: 109 mg/dL — ABNORMAL HIGH (ref 70–99)
Glucose-Capillary: 100 mg/dL — ABNORMAL HIGH (ref 70–99)

## 2019-04-05 LAB — CBC
HCT: 34.8 % — ABNORMAL LOW (ref 39.0–52.0)
Hemoglobin: 11.1 g/dL — ABNORMAL LOW (ref 13.0–17.0)
MCH: 29 pg (ref 26.0–34.0)
MCHC: 31.9 g/dL (ref 30.0–36.0)
MCV: 90.9 fL (ref 80.0–100.0)
Platelets: 451 10*3/uL — ABNORMAL HIGH (ref 150–400)
RBC: 3.83 MIL/uL — ABNORMAL LOW (ref 4.22–5.81)
RDW: 15.3 % (ref 11.5–15.5)
WBC: 13.6 10*3/uL — ABNORMAL HIGH (ref 4.0–10.5)
nRBC: 0 % (ref 0.0–0.2)

## 2019-04-05 MED ORDER — POLYETHYLENE GLYCOL 3350 17 G PO PACK: 17.0000 g | PACK | Freq: Every day | ORAL | Status: DC | PRN
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
## 2019-04-05 NOTE — Progress Notes (Signed)
Call to Dr Dwain Sarna with patient's temp 102.2 , 125, 126/68, 26.  Patient did have earlier increase heart rate 140 and asked for Ativan 1 mg IV.  Patient able to slow breathing down and did ok back to base line . Orders received.
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
## 2019-04-05 NOTE — Progress Notes (Signed)
Wet to dry dsg change to RLE. Wound measures 32 in x 7 in with blacken edges and staples. Yellow drainage oozing out with with some horiztional areas throughout base of the wound with white and yellow exudate. Patient stated he could feel the coldness of the dsgs tonight.
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
## 2019-04-06 LAB — BASIC METABOLIC PANEL
Anion gap: 10 (ref 5–15)
BUN: 21 mg/dL — ABNORMAL HIGH (ref 6–20)
CO2: 22 mmol/L (ref 22–32)
Calcium: 8.9 mg/dL (ref 8.9–10.3)
Chloride: 101 mmol/L (ref 98–111)
Creatinine, Ser: 0.96 mg/dL (ref 0.61–1.24)
GFR calc Af Amer: >60 mL/min (ref >60)
GFR calc non Af Amer: >60 mL/min (ref >60)
Glucose, Bld: 119 mg/dL — ABNORMAL HIGH (ref 70–99)
Potassium: 3.6 mmol/L (ref 3.5–5.1)
Sodium: 133 mmol/L — ABNORMAL LOW (ref 135–145)

## 2019-04-06 LAB — CBC
HCT: 33.6 % — ABNORMAL LOW (ref 39.0–52.0)
Hemoglobin: 10.8 g/dL — ABNORMAL LOW (ref 13.0–17.0)
MCH: 29.3 pg (ref 26.0–34.0)
MCHC: 32.1 g/dL (ref 30.0–36.0)
MCV: 91.3 fL (ref 80.0–100.0)
Platelets: 451 10*3/uL — ABNORMAL HIGH (ref 150–400)
RBC: 3.68 MIL/uL — ABNORMAL LOW (ref 4.22–5.81)
RDW: 15.2 % (ref 11.5–15.5)
WBC: 7.9 10*3/uL (ref 4.0–10.5)
nRBC: 0 % (ref 0.0–0.2)

## 2019-04-06 LAB — GLUCOSE, CAPILLARY
Glucose-Capillary: 118 mg/dL — ABNORMAL HIGH (ref 70–99)
Glucose-Capillary: 135 mg/dL — ABNORMAL HIGH (ref 70–99)
Glucose-Capillary: 105 mg/dL — ABNORMAL HIGH (ref 70–99)
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
## 2019-04-06 NOTE — Progress Notes (Signed)
Paged [employer] regarding TLSO strap coming off and needing to be fixed.
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
## 2019-04-07 LAB — BLOOD CULTURE ID PANEL (REFLEXED)
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
## 2019-04-07 NOTE — Plan of Care (Signed)
  Problem: Education: Goal: Knowledge of General Education information will improve Description: Including pain rating scale, medication(s)/side effects and non-pharmacologic comfort measures Outcome: Progressing   Problem: Health Behavior/Discharge Planning: Goal: Ability to manage health-related needs will improve Outcome: Progressing   Problem: Clinical Measurements: Goal: Ability to maintain clinical measurements within normal limits will improve Outcome: Progressing Goal: Will remain free from infection Outcome: Progressing Goal: Diagnostic test results will improve Outcome: Progressing Goal: Respiratory complications will improve Outcome: Progressing Goal: Cardiovascular complication will be avoided Outcome: Progressing   Problem: Activity: Goal: Risk for activity intolerance will decrease Outcome: Progressing   Problem: Nutrition: Goal: Adequate nutrition will be maintained Outcome: Progressing   Problem: Coping: Goal: Level of anxiety will decrease Outcome: Progressing   Problem: Elimination: Goal: Will not experience complications related to bowel motility Outcome: Progressing Goal: Will not experience complications related to urinary retention Outcome: Progressing   Problem: Pain Managment: Goal: General experience of comfort will improve Outcome: Progressing   Problem: Safety: Goal: Ability to remain free from injury will improve Outcome: Progressing   Problem: Skin Integrity: Goal: Risk for impaired skin integrity will decrease Outcome: Progressing   Problem: Activity: Goal: Ability to avoid complications of mobility impairment will improve Outcome: Progressing Goal: Ability to tolerate increased activity will improve Outcome: Progressing   Problem: Education: Goal: Verbalization of understanding the information provided will improve Outcome: Progressing   Problem: Coping: Goal: Level of anxiety will decrease Outcome: Progressing   Problem:  Physical Regulation: Goal: Postoperative complications will be avoided or minimized Outcome: Progressing   Problem: Respiratory: Goal: Ability to maintain a clear airway will improve Outcome: Progressing   Problem: Pain Management: Goal: Pain level will decrease Outcome: Progressing   Problem: Skin Integrity: Goal: Signs of wound healing will improve Outcome: Progressing   Problem: Tissue Perfusion: Goal: Ability to maintain adequate tissue perfusion will improve Outcome: Progressing   
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
## 2019-04-08 MED ORDER — POLYETHYLENE GLYCOL 3350 17 G PO PACK
17.0000 g | PACK | Freq: Every day | ORAL | Status: DC
  Administered 2019-04-08: 17 g via ORAL
  Filled 2019-04-08 (×3): qty 1

## 2019-04-08 MED ORDER — SODIUM CHLORIDE 0.9 % IV SOLN
2.0000 g | Freq: Three times a day (TID) | INTRAVENOUS | Status: DC
  Administered 2019-04-08 – 2019-04-13 (×15): 2 g via INTRAVENOUS
  Filled 2019-04-08 (×20): qty 2

## 2019-04-08 MED ORDER — DOCUSATE SODIUM 100 MG PO CAPS
100.0000 mg | ORAL_CAPSULE | Freq: Two times a day (BID) | ORAL | Status: DC
  Administered 2019-04-08 – 2019-04-28 (×38): 100 mg via ORAL
  Filled 2019-04-08 (×39): qty 1

## 2019-04-08 NOTE — Progress Notes (Signed)
PriM 2 for safety/equipment;Mod assist         General bed mobility comments: pt able to use LUE and to assist with moving LLE when rolling to the R to don TLSO  Transfers                 General transfer comment: pt positioned with HOB at 55* in prep for OOB to recliner later this AM    Balance                                           ADL either performed or assessed with clinical judgement   ADL Overall ADL's : Needs assistance/impaired                 Upper Body Dressing : Total assistance;Bed level Upper Body Dressing Details (indicate cue type and reason): donning brace                 Functional mobility during ADLs: Maximal assistance;+2 for physical assistance General ADL Comments: assisted with rolling to don TLSO in prPricilla [id number] Medical Center - Merceda FinnBretta BangadCleveland-Mal<BADTEXTTSurgery Center Of Scottsdale LLC Dba Mountain [facility] Center Of ScotPricilla [id number] [id number] Mary Hitchcock MemoriaPricilla [id number] [id number] Covena nt MedLEvergreen Hospital Medical Centerrline Bretta Bangrline BPiedmont Athens Regional Med Centeretta BBretta BangAG>in Johns8naPricM436 [place] LLCl<BADTBretta [facility] Medical Center - LakewoodangsleBretta BangG>in JohnsLovPrMal<BSanford Health SanfPricilla [id number] [id number] [place] y HPricilla [id number] [id number] Land[facility] Hospi tal OfLAthens Eye SurgeryAdvanced Pain Surgical Center IncCenterBretta BangangSelect Specialty Hospital - Orlando SouthelessMBretta BangEXTTAG>in Johns>inBroaddus Hospital AssociationricilMBretta Bangin JohnsssJPMEverest RehabiliPricilla [id number] [id number] Altru Rehabili tationLCitrus Endoscopy Centerrline Bretta BangessContinuing Care HospitalMaMal<Bretta Hill [facility]al Health ServicesangJohBretta BangTTAG>in JohnsLovPricCypress Fairbanks Medical Centeral<BADBretta BangnslesPriciG. V. (Sonny) MontgPricilla [id number] [id number] [place] urgerBanner Heart HospitalurlineBretta BangesPrMal in JohnsvePricilla Lovelessesshnsson  Perception     Praxis      Cognition Arousal/Alertness: Awake/alert Behavior During Therapy: WFL for tasks assessed/performed Overall Cognitive Status: Impaired/Different from baseline                 Rancho Levels of Cognitive Functioning Rancho Los Amigos Scales of Cognitive Functioning: Automatic/appropriate   Current Attention Level: Selective   Following Commands: Follows one step commands consistently;Follows multi-step commands consistently       General Comments: pt continues to demonstrate improvements in cognition, appropriate and following all commands this session, fully oriented, will continue to assess higher level cognition         Exercises     Shoulder Instructions       General  Comments      Pertinent Vitals/ Pain       Pain Assessment: Faces Faces Pain Scale: Hurts a little bit Pain Location: generalized, with placement of TLSO Pain Descriptors / Indicators: Discomfort;Grimacing Pain Intervention(s): Monitored during session;Repositioned;Limited activity within patient's tolerance  Home Living                                          Prior Functioning/Environment              Frequency  Min 3X/week        Progress Toward Goals  OT Goals(current goals can now be found in the care plan section)  Progress towards OT goals: Progressing toward goals  Acute Rehab OT Goals Patient Stated Goal: wife would like him to be able to do as much as possible and return home  OT Goal Formulation: With patient/family Time For Goal Achievement: 04/19/19 Potential to Achieve Goals: Good  Plan Discharge plan remains appropriate    Co-evaluation    PT/OT/SLP Co-Evaluation/Treatment: Yes Reason for Co-Treatment: For patient/therapist safety;To address functional/ADL transfers   OT goals addressed during session: Strengthening/ROM      AM-PAC OT "6 Clicks" Daily Activity     Outcome Measure   Help from another person eating meals?: A Little Help from another person taking care of personal grooming?: A Lot Help from another person toileting, which includes using toliet, bedpan, or urinal?: Total Help from another person bathing (including washing, rinsing, drying)?: Total Help from another person to put on and taking off regular upper body clothing?: Total Help from another person to put on and taking off regular lower body clothing?: Total 6 Click Score: 9    End of Session Equipment Utilized During Treatment: Back brace  OT Visit Diagnosis: Muscle weakness (generalized) (M62.81);Cognitive communication deficit (R41.841);Other abnormalities of gait and mobility (R26.89)   Activity Tolerance Patient tolerated treatment well    Patient Left in bed;with call bell/phone within reach   Nurse Communication Mobility status        Time: 5465-6812 OT Time Calculation (min): 11 min  Charges: OT General Charges $OT Visit: 1 Visit OT Treatments $Self Care/Home Management : 8-22 mins  Marcy Siren, OT Supplemental Rehabilitation Services Pager [phone] Office [phone]   Orlando Penner 04/08/2019, 1:50 PM
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
## 2019-04-08 NOTE — NC FL2 (Signed)
[place] LEVEL OF CARE SCREENING TOOL     IDENTIFICATION  Patient Name: Travis Benson Birthdate: February 03, 1961 Sex: male Admission Date (Current Location): 02/28/2019  [facility] and [place] Number:  Herbalist and Address:  [facility] [place] [address], [place], [place] [address]      Provider Number: [id number]  Attending Physician Name and Address:  Md, Trauma, MD  Relative Name and Phone Number:       Current Level of Care: Hospital Recommended Level of Care: [place] Prior Approval Number:    Date Approved/Denied:   PASRR Number:    Discharge Plan: SNF    Current Diagnoses: Patient Active Problem List   Diagnosis Date Noted  . Open left tibial fracture 03/02/2019  . Unspecified injury of popliteal artery, right leg, initial encounter 03/02/2019  . Popliteal vein injury, right, initial encounter 03/02/2019  . 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 Pelvic ring fracture ([place]), Right  03/02/2019  . Left scapula fracture 03/02/2019  . Right knee dislocation 03/02/2019  . Femur fracture, left ([place]) 02/28/2019    Orientation RESPIRATION BLADDER Height & Weight     Self, Time, Place  Normal Incontinent Weight: 206 lb 2.1 oz (93.5 kg) Height:  6' (182.9 cm)  BEHAVIORAL SYMPTOMS/MOOD NEUROLOGICAL BOWEL NUTRITION STATUS      Incontinent (dysphagia 3 thin liquids; no pork)  AMBULATORY STATUS COMMUNICATION OF NEEDS Skin   Total Care Verbally Surgical wounds(Leg Incision)                       Personal Care Assistance Level of Assistance  Total care       Total Care Assistance: Maximum assistance   Functional Limitations Info             SPECIAL CARE FACTORS FREQUENCY  PT (By licensed PT), OT (By licensed OT), Speech therapy     PT Frequency: 5-6 times per week OT Frequency: 5-6 times per week     Speech Therapy Frequency: 5-6 times per week      Contractures Contractures  Info: Not present    Additional Factors Info  Code Status, Allergies Code Status Info: Full Code Allergies Info: No Known Allergies           Current Medications (04/08/2019):  This is the current hospital active medication list Current Facility-Administered Medications  Medication Dose Route Frequency Provider Last Rate Last Dose  . 0.9 %  sodium chloride infusion   Intravenous Continuous Ainsley Spinner, PA-C 10 mL/hr at 03/29/19 2127    . 0.9 %  sodium chloride infusion   Intravenous PRN Ainsley Spinner, PA-C 10 mL/hr at 03/27/19 9024    . acetaminophen (TYLENOL) tablet 650 mg  650 mg Oral Q6H Focht, Allah Reason L, PA   650 mg at 04/08/19 1349  . albuterol (PROVENTIL) (2.5 MG/3ML) 0.083% nebulizer solution 2.5 mg  2.5 mg Nebulization Q6H PRN Ainsley Spinner, PA-C      . aspirin EC tablet 81 mg  81 mg Oral Daily Focht, Alvetta Hidrogo L, PA   81 mg at 04/08/19 0945  . bisacodyl (DULCOLAX) suppository 10 mg  10 mg Rectal Daily PRN Ainsley Spinner, PA-C   10 mg at 03/11/19 0973  . ceFEPIme (MAXIPIME) 2 g in sodium chloride 0.9 % 100 mL IVPB  2 g Intravenous Q8H Skeet Simmer, RPH 200 mL/hr at 04/08/19 1349 2 g at 04/08/19 1349  . chlorhexidine gluconate (  MEDLINE KIT) (PERIDEX) 0.12 % solution 15 mL  15 mL Mouth Rinse BID Ainsley Spinner, PA-C   15 mL at 04/08/19 0956  . docusate sodium (COLACE) capsule 100 mg  100 mg Oral BID Meuth, Brooke A, PA-C   100 mg at 04/08/19 0945  . enoxaparin (LOVENOX) injection 40 mg  40 mg Subcutaneous Q24H Ainsley Spinner, PA-C   40 mg at 04/08/19 0945  . feeding supplement (BOOST / RESOURCE BREEZE) liquid 1 Container  1 Container Oral BID BM Georganna Skeans, MD   1 Container at 04/05/19 [phone]  . feeding supplement (ENSURE ENLIVE) (ENSURE ENLIVE) liquid 237 mL  237 mL Oral BID BM Georganna Skeans, MD   237 mL at 04/08/19 0945  . feeding supplement (PRO-STAT SUGAR FREE 64) liquid 30 mL  30 mL Per Tube TID Georganna Skeans, MD   30 mL at 04/07/19 2115  . fentaNYL (SUBLIMAZE) injection 50-100 mcg   50-100 mcg Intravenous Q1H PRN Ainsley Spinner, PA-C   50 mcg at 04/03/19 0444  . hydrALAZINE (APRESOLINE) injection 5 mg  5 mg Intravenous Q4H PRN Ainsley Spinner, PA-C   5 mg at 03/12/19 3343  . labetalol (NORMODYNE,TRANDATE) injection 10 mg  10 mg Intravenous Q2H PRN Ainsley Spinner, PA-C   10 mg at 04/03/19 2318  . LORazepam (ATIVAN) injection 1 mg  1 mg Intravenous Q4H PRN Ainsley Spinner, PA-C   1 mg at 04/04/19 2231  . metoprolol tartrate (LOPRESSOR) injection 10 mg  10 mg Intravenous Q4H PRN Ainsley Spinner, PA-C      . metoprolol tartrate (LOPRESSOR) tablet 25 mg  25 mg Oral BID Focht, Emeline Simpson L, PA   25 mg at 04/08/19 0945  . ondansetron (ZOFRAN-ODT) disintegrating tablet 4 mg  4 mg Oral Q6H PRN Ainsley Spinner, PA-C       Or  . ondansetron [facility]) injection 4 mg  4 mg Intravenous Q6H PRN Ainsley Spinner, PA-C   4 mg at 03/02/19 2320  . oxyCODONE (Oxy IR/ROXICODONE) immediate release tablet 5-10 mg  5-10 mg Oral Q4H PRN Focht, Polk Minor L, PA   10 mg at 04/07/19 0051  . polyethylene glycol (MIRALAX / GLYCOLAX) packet 17 g  17 g Oral Daily Meuth, Brooke A, PA-C   17 g at 04/08/19 0945  . Resource ThickenUp Clear   Oral PRN Georganna Skeans, MD      . sodium chloride flush (NS) 0.9 % injection 10-40 mL  10-40 mL Intracatheter Q12H Ainsley Spinner, PA-C   10 mL at 04/08/19 0957     Discharge Medications: Please see discharge summary for a list of discharge medications.  Relevant Imaging Results:  Relevant Lab Results:   Additional Information SSN  [id number]   Jesse Dayyan Krist, [place]
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
## 2019-04-08 NOTE — Progress Notes (Signed)
Pharmacy Antibiotic Note  Travis Benson is a 58 y.o. male on day # 7 Cefazolin for pin tract infection. BCID on 4/8 reported Enterobacteriaceae in 1 of 4 bottles, now identified as Enterobacter.  Pharmacy has been consulted for Cefepime dosing. Afebrile, WBC 7.9 on 4/7.  On Tylenol 650 mg PO q6hrs, so potentially masking fevers.  Discussed with Dr. Janee Morn.  Plan:  Cefepime 2gm IV q8hrs.  Stop Cefazolin.  Follow final renal function, final culture data, clinical progress.  Height: 6' (182.9 cm) Weight: 206 lb 2.1 oz (93.5 kg) IBW/kg (Calculated) : 77.6  Temp (24hrs), Avg:98.3 F (36.8 C), Min:98 F (36.7 C), Max:98.6 F (37 C)  Recent Labs  Lab 04/02/19 0545 04/02/19 0804 04/05/19 0319 04/06/19 0326  WBC 10.5  --  13.6* 7.9  CREATININE  --  0.88  --  0.96    Estimated Creatinine Clearance: 98.4 mL/min (by C-G formula based on SCr of 0.96 mg/dL).    No Known Allergies  Antimicrobials this admission:  Cefepime 3/20 >>3/29 (for pneumonia);  Resume 4/9>>  Zosyn 3/2 >> 3/6  Acyclovir 3/11>>3/12 (none given)  Valacyclovir per tube 3/12>>3/18  Cefazolin 4/3>>4/9  Polysporin ophth ointment 3/16>>4/8  Microbiology results: 4/6 blood x 2 - GNR in 1 of 4 bottles + Enterobacter    Others with ng x 3 days to date  4/8 BCID - Enterobacteriaceae in 1 of 4 bottles but no ID  3/24 tracheal aspirate - few Staph aureus, sens Oxa, TCN, Cipro, Vanc (MIC < 0.5), Septra  3/20 blood x 2 - negative  3/1 MRSA PCR negative  Thank you for allowing pharmacy to be a part of this patient's care.  Dennie Fetters, [place] Pager: [phone] or phone: [phone] 04/08/2019 10:09 AM

## 2019-04-08 NOTE — Progress Notes (Signed)
Subjective: The patient is alert and pleasant.  He looks much better than the last time I saw him.  Objective: Vital signs in last 24 hours: Temp:  [98 F (36.7 C)-98.6 F (37 C)] 98.3 F (36.8 C) (04/09 0415) Pulse Rate:  [92-103] 95 (04/09 0415) Resp:  [18] 18 (04/08 1233) BP: (129-156)/(82-93) 141/84 (04/09 0415) SpO2:  [96 %-100 %] 96 % (04/09 0415) Weight:  [93.5 kg] 93.5 kg (04/09 0500) Estimated body mass index is 27.96 kg/m as calculated from the following:   Height as of this encounter: 6' (1.829 m).   Weight as of this encounter: 93.5 kg.   Intake/Output from previous day: 04/08 0701 - 04/09 0700 In: -  Out: 750 [Urine:350] Intake/Output this shift: No intake/output data recorded.  Physical exam the patient is alert and oriented x3.  He is moving all 4 extremities.  Lab Results: Recent Labs    04/06/19 0326  WBC 7.9  HGB 10.8*  HCT 33.6*  PLT 451*   BMET Recent Labs    04/06/19 0326  NA 133*  K 3.6  CL 101  CO2 22  GLUCOSE 119*  BUN 21*  CREATININE 0.96  CALCIUM 8.9    Studies/Results: No results found.  Assessment/Plan: Thoracic fractures: These should heal in his TLSO.  I would continue to wear it for 10 weeks total when out of bed or head of bed greater than 30 degrees.  Left extra-axial fluid collection: He is doing well clinically.  I will sign off.  Please have him follow-up with me in the office after discharge.  Please call if I can be of further assistance.  LOS: 39 days     Cristi Loron 04/08/2019, 8:08 AM
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
## 2019-04-09 ENCOUNTER — Inpatient Hospital Stay (HOSPITAL_COMMUNITY): Payer: Medicaid Other | Admitting: Certified Registered"

## 2019-04-09 ENCOUNTER — Encounter (HOSPITAL_COMMUNITY): Admission: EM | Disposition: A | Discharge status: Skilled Nursing Facility | Payer: Self-pay | Source: Home / Self Care

## 2019-04-09 ENCOUNTER — Inpatient Hospital Stay (HOSPITAL_COMMUNITY): Payer: Medicaid Other

## 2019-04-09 ENCOUNTER — Encounter (HOSPITAL_COMMUNITY): Payer: Self-pay | Admitting: *Deleted

## 2019-04-09 HISTORY — PX: APPLICATION OF WOUND VAC: SHX5189

## 2019-04-09 HISTORY — PX: I & D EXTREMITY: SHX5045

## 2019-04-09 LAB — SURGICAL PCR SCREEN
MRSA, PCR: NEGATIVE
Staphylococcus aureus: POSITIVE — AB

## 2019-04-09 LAB — CULTURE, BLOOD (ROUTINE X 2): Special Requests: ADEQUATE

## 2019-04-09 SURGERY — IRRIGATION AND DEBRIDEMENT EXTREMITY
Anesthesia: General | Laterality: Right

## 2019-04-09 MED ORDER — MIDAZOLAM HCL 2 MG/2ML IJ SOLN
INTRAMUSCULAR | Status: AC
  Filled 2019-04-09: qty 2

## 2019-04-09 MED ORDER — 0.9 % SODIUM CHLORIDE (POUR BTL) OPTIME
TOPICAL | Status: DC | PRN
  Administered 2019-04-09: 1000 mL

## 2019-04-09 MED ORDER — DEXAMETHASONE SODIUM PHOSPHATE 10 MG/ML IJ SOLN
INTRAMUSCULAR | Status: AC
  Filled 2019-04-09: qty 1

## 2019-04-09 MED ORDER — FENTANYL CITRATE (PF) 250 MCG/5ML IJ SOLN
INTRAMUSCULAR | Status: DC | PRN
  Administered 2019-04-09: 100 ug via INTRAVENOUS
  Administered 2019-04-09: 50 ug via INTRAVENOUS

## 2019-04-09 MED ORDER — ONDANSETRON HCL 4 MG/2ML IJ SOLN
INTRAMUSCULAR | Status: AC
  Filled 2019-04-09: qty 2

## 2019-04-09 MED ORDER — PROPOFOL 10 MG/ML IV BOLUS
INTRAVENOUS | Status: DC | PRN
  Administered 2019-04-09: 200 mg via INTRAVENOUS

## 2019-04-09 MED ORDER — MIDAZOLAM HCL 2 MG/2ML IJ SOLN
INTRAMUSCULAR | Status: DC | PRN
  Administered 2019-04-09: 2 mg via INTRAVENOUS

## 2019-04-09 MED ORDER — LIDOCAINE 2% (20 MG/ML) 5 ML SYRINGE
INTRAMUSCULAR | Status: AC
  Filled 2019-04-09: qty 5

## 2019-04-09 MED ORDER — SUCCINYLCHOLINE CHLORIDE 200 MG/10ML IV SOSY
PREFILLED_SYRINGE | INTRAVENOUS | Status: AC
  Filled 2019-04-09: qty 10

## 2019-04-09 MED ORDER — SUGAMMADEX SODIUM 200 MG/2ML IV SOLN
INTRAVENOUS | Status: DC | PRN
  Administered 2019-04-09: 200 mg via INTRAVENOUS

## 2019-04-09 MED ORDER — SODIUM CHLORIDE 0.9 % IR SOLN
Status: DC | PRN
  Administered 2019-04-09: 3000 mL

## 2019-04-09 MED ORDER — ROCURONIUM BROMIDE 10 MG/ML (PF) SYRINGE
PREFILLED_SYRINGE | INTRAVENOUS | Status: DC | PRN
  Administered 2019-04-09: 25 mg via INTRAVENOUS
  Administered 2019-04-09: 50 mg via INTRAVENOUS
  Administered 2019-04-09: 25 mg via INTRAVENOUS

## 2019-04-09 MED ORDER — LACTATED RINGERS IV SOLN
INTRAVENOUS | Status: DC
  Administered 2019-04-09 – 2019-04-13 (×3): via INTRAVENOUS

## 2019-04-09 MED ORDER — LIDOCAINE 2% (20 MG/ML) 5 ML SYRINGE
INTRAMUSCULAR | Status: DC | PRN
  Administered 2019-04-09: 100 mg via INTRAVENOUS

## 2019-04-09 MED ORDER — ONDANSETRON HCL 4 MG/2ML IJ SOLN
INTRAMUSCULAR | Status: DC | PRN
  Administered 2019-04-09: 4 mg via INTRAVENOUS

## 2019-04-09 MED ORDER — ROCURONIUM BROMIDE 50 MG/5ML IV SOSY
PREFILLED_SYRINGE | INTRAVENOUS | Status: AC
  Filled 2019-04-09: qty 5

## 2019-04-09 MED ORDER — DEXAMETHASONE SODIUM PHOSPHATE 10 MG/ML IJ SOLN
INTRAMUSCULAR | Status: DC | PRN
  Administered 2019-04-09: 10 mg via INTRAVENOUS

## 2019-04-09 MED ORDER — PROPOFOL 10 MG/ML IV BOLUS
INTRAVENOUS | Status: AC
  Filled 2019-04-09: qty 20

## 2019-04-09 MED ORDER — SUCCINYLCHOLINE CHLORIDE 200 MG/10ML IV SOSY
PREFILLED_SYRINGE | INTRAVENOUS | Status: DC | PRN
  Administered 2019-04-09: 140 mg via INTRAVENOUS

## 2019-04-09 MED ORDER — PRO-STAT SUGAR FREE PO LIQD
30.0000 mL | Freq: Two times a day (BID) | ORAL | Status: DC
  Administered 2019-04-10 – 2019-04-25 (×21): 30 mL
  Filled 2019-04-09 (×28): qty 30

## 2019-04-09 MED ORDER — FENTANYL CITRATE (PF) 250 MCG/5ML IJ SOLN
INTRAMUSCULAR | Status: AC
  Filled 2019-04-09: qty 5

## 2019-04-09 SURGICAL SUPPLY — 64 items
BANDAGE ACE 4X5 VEL STRL LF (GAUZE/BANDAGES/DRESSINGS) ×2 IMPLANT
BANDAGE ACE 6X5 VEL STRL LF (GAUZE/BANDAGES/DRESSINGS) ×2 IMPLANT
BNDG COHESIVE 4X5 TAN STRL (GAUZE/BANDAGES/DRESSINGS) ×3 IMPLANT
BNDG ELASTIC 6X10 VLCR STRL LF (GAUZE/BANDAGES/DRESSINGS) ×2 IMPLANT
BNDG GAUZE ELAST 4 BULKY (GAUZE/BANDAGES/DRESSINGS) ×6 IMPLANT
BNDG GAUZE STRTCH 6 (GAUZE/BANDAGES/DRESSINGS) ×9 IMPLANT
BRUSH SCRUB SURG 4.25 DISP (MISCELLANEOUS) ×6 IMPLANT
CANISTER SUCT 3000ML PPV (MISCELLANEOUS) ×3 IMPLANT
CANISTER WOUND CARE 500ML ATS (WOUND CARE) ×5 IMPLANT
COVER SURGICAL LIGHT HANDLE (MISCELLANEOUS) ×4 IMPLANT
COVER WAND RF STERILE (DRAPES) ×1 IMPLANT
DRAPE HALF SHEET 40X57 (DRAPES) ×2 IMPLANT
DRAPE INCISE IOBAN 66X45 STRL (DRAPES) IMPLANT
DRAPE ORTHO SPLIT 77X108 STRL (DRAPES)
DRAPE SURG ORHT 6 SPLT 77X108 (DRAPES) IMPLANT
DRAPE U-SHAPE 47X51 STRL (DRAPES) ×3 IMPLANT
DRESSING VERAFLO CLEANSE CC (GAUZE/BANDAGES/DRESSINGS) IMPLANT
DRSG ADAPTIC 3X8 NADH LF (GAUZE/BANDAGES/DRESSINGS) ×1 IMPLANT
DRSG MEPITEL 4X7.2 (GAUZE/BANDAGES/DRESSINGS) ×2 IMPLANT
DRSG PAD ABDOMINAL 8X10 ST (GAUZE/BANDAGES/DRESSINGS) IMPLANT
DRSG VAC ATS LRG SENSATRAC (GAUZE/BANDAGES/DRESSINGS) IMPLANT
DRSG VAC ATS MED SENSATRAC (GAUZE/BANDAGES/DRESSINGS) IMPLANT
DRSG VAC ATS SM SENSATRAC (GAUZE/BANDAGES/DRESSINGS) IMPLANT
DRSG VERAFLO CLEANSE CC (GAUZE/BANDAGES/DRESSINGS) ×3
DRSG VERSA FOAM LRG 10X15 (GAUZE/BANDAGES/DRESSINGS) ×2 IMPLANT
ELECT REM PT RETURN 9FT ADLT (ELECTROSURGICAL) ×3
ELECTRODE REM PT RTRN 9FT ADLT (ELECTROSURGICAL) ×1 IMPLANT
GAUZE SPONGE 4X4 12PLY STRL (GAUZE/BANDAGES/DRESSINGS) ×3 IMPLANT
GAUZE XEROFORM 5X9 LF (GAUZE/BANDAGES/DRESSINGS) ×3 IMPLANT
GLOVE BIO SURGEON STRL SZ7.5 (GLOVE) ×3 IMPLANT
GLOVE BIO SURGEON STRL SZ8 (GLOVE) ×3 IMPLANT
GLOVE BIOGEL PI IND STRL 7.5 (GLOVE) ×1 IMPLANT
GLOVE BIOGEL PI IND STRL 8 (GLOVE) ×2 IMPLANT
GLOVE BIOGEL PI INDICATOR 7.5 (GLOVE) ×2
GLOVE BIOGEL PI INDICATOR 8 (GLOVE) ×4
GOWN STRL REUS W/ TWL LRG LVL3 (GOWN DISPOSABLE) ×2 IMPLANT
GOWN STRL REUS W/ TWL XL LVL3 (GOWN DISPOSABLE) ×1 IMPLANT
GOWN STRL REUS W/TWL LRG LVL3 (GOWN DISPOSABLE) ×4
GOWN STRL REUS W/TWL XL LVL3 (GOWN DISPOSABLE) ×2
HANDPIECE INTERPULSE COAX TIP (DISPOSABLE)
IMMOBILIZER KNEE 22 (SOFTGOODS) ×2 IMPLANT
KIT BASIN OR (CUSTOM PROCEDURE TRAY) ×3 IMPLANT
KIT TURNOVER KIT B (KITS) ×3 IMPLANT
MANIFOLD NEPTUNE II (INSTRUMENTS) ×3 IMPLANT
NS IRRIG 1000ML POUR BTL (IV SOLUTION) ×3 IMPLANT
PACK ORTHO EXTREMITY (CUSTOM PROCEDURE TRAY) ×3 IMPLANT
PAD ARMBOARD 7.5X6 YLW CONV (MISCELLANEOUS) ×6 IMPLANT
PAD CAST 4YDX4 CTTN HI CHSV (CAST SUPPLIES) IMPLANT
PAD NEG PRESSURE SENSATRAC (MISCELLANEOUS) ×2 IMPLANT
PADDING CAST COTTON 4X4 STRL (CAST SUPPLIES) ×2
PADDING CAST COTTON 6X4 STRL (CAST SUPPLIES) ×3 IMPLANT
SET HNDPC FAN SPRY TIP SCT (DISPOSABLE) IMPLANT
SPONGE LAP 18X18 RF (DISPOSABLE) ×3 IMPLANT
STOCKINETTE IMPERVIOUS 9X36 MD (GAUZE/BANDAGES/DRESSINGS) ×3 IMPLANT
SUT PDS AB 2-0 CT1 27 (SUTURE) IMPLANT
SWAB CULTURE ESWAB REG 1ML (MISCELLANEOUS) IMPLANT
TAPE CLOTH SURG 6X10 WHT LF (GAUZE/BANDAGES/DRESSINGS) ×2 IMPLANT
TOWEL OR 17X24 6PK STRL BLUE (TOWEL DISPOSABLE) ×1 IMPLANT
TOWEL OR 17X26 10 PK STRL BLUE (TOWEL DISPOSABLE) ×4 IMPLANT
TUBE CONNECTING 12'X1/4 (SUCTIONS) ×1
TUBE CONNECTING 12X1/4 (SUCTIONS) ×2 IMPLANT
UNDERPAD 30X30 (UNDERPADS AND DIAPERS) ×3 IMPLANT
WATER STERILE IRR 1000ML POUR (IV SOLUTION) ×3 IMPLANT
YANKAUER SUCT BULB TIP NO VENT (SUCTIONS) ×3 IMPLANT

## 2019-04-09 NOTE — Brief Op Note (Signed)
04/09/2019  5:57 PM  PATIENT:  Travis Benson  58 y.o. male  PRE-OPERATIVE DIAGNOSIS:   1. RIGHT LEG ABSCESS 2. SUSPECTED RIGHT TIBIA OSTEOMYELITIS 3. RIGHT KNEE DISLOCATION 4. RETAINED EXTERNAL FIXATOR WITH ULCERATED PIN SITES  POST-OPERATIVE DIAGNOSIS:   1. RIGHT LEG ABSCESS 2. RIGHT TIBIA OSTEOMYELITIS 3. RIGHT KNEE DISLOCATION 4. RETAINED EXTERNAL FIXATOR WITH ULCERATED PIN SITES  PROCEDURE:  Procedure(s): 1. PARTIAL EXCISION RIGHT TIBIA OSTEOMYELITIS 2. REMOVAL OF EXTERNAL FIXATOR UNDER ANESTHESIA 3. CURETTAGE OF ULCERATED PIN SITES RIGHT FEMUR INCLUDING BONE 4. APPLICATION OF LARGE WOUND VAC (Right) 5. STRESS FLUOROSCOPY OF RIGHT KNEE   SURGEON:  Surgeon(s) and Role:    * Myrene Galas, MD - Primary  PHYSICIAN ASSISTANT: Montez Morita, PA-C  ANESTHESIA:   general  EBL:  5 mL   BLOOD ADMINISTERED:none  DRAINS: none   LOCAL MEDICATIONS USED:  NONE  SPECIMEN:  Source of Specimen:  ABSCESS AROUND RIGHT TIBIA  DISPOSITION OF SPECIMEN:  MICRO  COUNTS:  YES  TOURNIQUET:  * No tourniquets in log *  DICTATION: .Other Dictation: Dictation Number [id number]  PLAN OF CARE: Admit to inpatient   PATIENT DISPOSITION:  PACU - hemodynamically stable.   Delay start of Pharmacological VTE agent (>24hrs) due to surgical blood loss or risk of bleeding: no

## 2019-04-09 NOTE — Transfer of Care (Signed)
Immediate Anesthesia Transfer of Care Note  Patient: Travis Benson  Procedure(s) Performed: IRRIGATION AND DEBRIDEMENT EXTREMITY R lower leg (Right ) APPLICATION OF WOUND VAC (Right )  Patient Location: PACU  Anesthesia Type:General  Level of Consciousness: awake, alert  and oriented  Airway & Oxygen Therapy: Patient Spontanous Breathing and Patient connected to face mask  Post-op Assessment: Report given to RN and Post -op Vital signs reviewed and stable  Post vital signs: Reviewed and stable  Last Vitals:  Vitals Value Taken Time  BP    Temp    Pulse 107 04/09/2019  5:44 PM  Resp 19 04/09/2019  5:45 PM  SpO2 100 % 04/09/2019  5:44 PM  Vitals shown include unvalidated device data.  Last Pain:  Vitals:   04/09/19 1158  TempSrc: Oral  PainSc:       Patients Stated Pain Goal: 0 (03/31/19 2000)  Complications: No apparent anesthesia complications
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
## 2019-04-09 NOTE — Plan of Care (Signed)
  Problem: Education: Goal: Knowledge of General Education information will improve Description: Including pain rating scale, medication(s)/side effects and non-pharmacologic comfort measures Outcome: Progressing   Problem: Health Behavior/Discharge Planning: Goal: Ability to manage health-related needs will improve Outcome: Progressing   Problem: Clinical Measurements: Goal: Ability to maintain clinical measurements within normal limits will improve Outcome: Progressing Goal: Will remain free from infection Outcome: Progressing Goal: Diagnostic test results will improve Outcome: Progressing Goal: Respiratory complications will improve Outcome: Progressing Goal: Cardiovascular complication will be avoided Outcome: Progressing   Problem: Activity: Goal: Risk for activity intolerance will decrease Outcome: Progressing   Problem: Nutrition: Goal: Adequate nutrition will be maintained Outcome: Progressing   Problem: Coping: Goal: Level of anxiety will decrease Outcome: Progressing   Problem: Elimination: Goal: Will not experience complications related to bowel motility Outcome: Progressing Goal: Will not experience complications related to urinary retention Outcome: Progressing   Problem: Pain Managment: Goal: General experience of comfort will improve Outcome: Progressing   Problem: Safety: Goal: Ability to remain free from injury will improve Outcome: Progressing   Problem: Skin Integrity: Goal: Risk for impaired skin integrity will decrease Outcome: Progressing   Problem: Activity: Goal: Ability to avoid complications of mobility impairment will improve Outcome: Progressing Goal: Ability to tolerate increased activity will improve Outcome: Progressing   Problem: Education: Goal: Verbalization of understanding the information provided will improve Outcome: Progressing   Problem: Coping: Goal: Level of anxiety will decrease Outcome: Progressing   Problem:  Physical Regulation: Goal: Postoperative complications will be avoided or minimized Outcome: Progressing   Problem: Respiratory: Goal: Ability to maintain a clear airway will improve Outcome: Progressing   Problem: Pain Management: Goal: Pain level will decrease Outcome: Progressing   Problem: Skin Integrity: Goal: Signs of wound healing will improve Outcome: Progressing   Problem: Tissue Perfusion: Goal: Ability to maintain adequate tissue perfusion will improve Outcome: Progressing   

## 2019-04-09 NOTE — Progress Notes (Signed)
Pt spouse called via telephone and stated that she would like Dr Carola Frost to called her before patient is scheduled for surgery.

## 2019-04-09 NOTE — Progress Notes (Signed)
PT Cancellation Note  Patient Details Name: Travis Benson MRN: [id number] DOB: 05-14-1961   Cancelled Treatment:    Reason Eval/Treat Not Completed: Patient at procedure or test/unavailable.  Pt gone to surgery this afternoon.  Will see pt on Monday as able. 04/09/2019  [place] Bing, PT Acute Rehabilitation Services [phone]  (pager) [phone]  (office)   Eliseo Gum Legend Tumminello 04/09/2019, 4:49 PM

## 2019-04-09 NOTE — Progress Notes (Signed)
I discussed with the patient the risks and benefits of surgery for removal of the fixator, debridement and wound vac application to the infected, including the possibility of persistent infection, nerve injury, vessel injury, wound breakdown, arthritis, recurrent instability, DVT/ PE, loss of motion, and need for further surgery among others.  We also specifically discussed the elevated risk of soft tissue breakdown and poor function that could lead to above knee amputation.  He acknowledged these risks and wished to proceed.  Myrene Galas, MD [facility], [facility] [phone]
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
## 2019-04-09 NOTE — Anesthesia Postprocedure Evaluation (Signed)
Anesthesia Post Note  Patient: Travis Benson  Procedure(s) Performed: IRRIGATION AND DEBRIDEMENT EXTREMITY R lower leg (Right ) APPLICATION OF WOUND VAC (Right )     Patient location during evaluation: PACU Anesthesia Type: General Level of consciousness: awake and alert, patient cooperative and oriented Pain management: pain level controlled Vital Signs Assessment: post-procedure vital signs reviewed and stable Respiratory status: spontaneous breathing, nonlabored ventilation, respiratory function stable and patient connected to nasal cannula oxygen Cardiovascular status: blood pressure returned to baseline and stable Postop Assessment: no apparent nausea or vomiting Anesthetic complications: no    Last Vitals:  Vitals:   04/09/19 0750 04/09/19 1158  BP: (!) 161/93 135/83  Pulse: 97 85  Resp: 18 16  Temp: 36.7 C 37 C  SpO2: 97% 98%    Last Pain:  Vitals:   04/09/19 1158  TempSrc: Oral  PainSc:                  Stephone Gum,E. Raeghan Demeter
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
## 2019-04-09 NOTE — Anesthesia Procedure Notes (Signed)
Procedure Name: Intubation Date/Time: 04/09/2019 4:07 PM Performed by: Teressa Lower., CRNA Pre-anesthesia Checklist: Patient identified, Emergency Drugs available, Suction available and Patient being monitored Patient Re-evaluated:Patient Re-evaluated prior to induction Oxygen Delivery Method: Circle system utilized Preoxygenation: Pre-oxygenation with 100% oxygen Induction Type: IV induction Ventilation: Mask ventilation without difficulty Laryngoscope Size: Mac and 4 Grade View: Grade I Tube type: Oral Tube size: 7.5 mm Number of attempts: 1 Airway Equipment and Method: Stylet Placement Confirmation: ETT inserted through vocal cords under direct vision,  positive ETCO2 and breath sounds checked- equal and bilateral Secured at: 22 cm Tube secured with: Tape Dental Injury: Teeth and Oropharynx as per pre-operative assessment

## 2019-04-09 NOTE — Progress Notes (Signed)
SLP Cancellation Note  Patient Details Name: Travis Benson MRN: [id number] DOB: 06-Jun-1961   Cancelled treatment:       Reason Eval/Treat Not Completed: Patient declined, no reason specified. Pt was approached for therapy but he reported that he was mentally drained from waiting on surgery today and being NPO. He therefore requested that SLP treatment be deferred. SLP will follow up.   Saverio Kader I. Vear Clock, MS, CCC-SLP Acute Rehabilitation Services Office number [phone] Pager [phone]  [name] 04/09/2019, 12:10 PM

## 2019-04-10 ENCOUNTER — Encounter (HOSPITAL_COMMUNITY): Payer: Self-pay | Admitting: Orthopedic Surgery

## 2019-04-10 LAB — CBC
HCT: 34.2 % — ABNORMAL LOW (ref 39.0–52.0)
Hemoglobin: 11.2 g/dL — ABNORMAL LOW (ref 13.0–17.0)
MCH: 30 pg (ref 26.0–34.0)
MCHC: 32.7 g/dL (ref 30.0–36.0)
MCV: 91.7 fL (ref 80.0–100.0)
Platelets: 493 10*3/uL — ABNORMAL HIGH (ref 150–400)
RBC: 3.73 MIL/uL — ABNORMAL LOW (ref 4.22–5.81)
RDW: 14.9 % (ref 11.5–15.5)
WBC: 11.3 10*3/uL — ABNORMAL HIGH (ref 4.0–10.5)
nRBC: 0 % (ref 0.0–0.2)

## 2019-04-10 LAB — BASIC METABOLIC PANEL
Anion gap: 13 (ref 5–15)
BUN: 16 mg/dL (ref 6–20)
CO2: 22 mmol/L (ref 22–32)
Calcium: 9.1 mg/dL (ref 8.9–10.3)
Chloride: 97 mmol/L — ABNORMAL LOW (ref 98–111)
Creatinine, Ser: 0.99 mg/dL (ref 0.61–1.24)
GFR calc Af Amer: >60 mL/min (ref >60)
GFR calc non Af Amer: >60 mL/min (ref >60)
Glucose, Bld: 122 mg/dL — ABNORMAL HIGH (ref 70–99)
Potassium: 4.5 mmol/L (ref 3.5–5.1)
Sodium: 132 mmol/L — ABNORMAL LOW (ref 135–145)

## 2019-04-10 LAB — CULTURE, BLOOD (ROUTINE X 2)
Culture: NO GROWTH
Special Requests: ADEQUATE
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
## 2019-04-11 DIAGNOSIS — M869 Osteomyelitis, unspecified: Secondary | ICD-10-CM

## 2019-04-11 MED ORDER — METHOCARBAMOL 1000 MG/10ML IJ SOLN
1000.0000 mg | Freq: Four times a day (QID) | INTRAVENOUS | Status: DC | PRN
  Filled 2019-04-11: qty 10

## 2019-04-11 MED ORDER — POLYETHYLENE GLYCOL 3350 17 G PO PACK: 34.0000 g | PACK | Freq: Two times a day (BID) | ORAL | Status: DC | PRN

## 2019-04-11 MED ORDER — ACETAMINOPHEN 500 MG PO TABS
1000.0000 mg | ORAL_TABLET | Freq: Four times a day (QID) | ORAL | Status: DC
  Filled 2019-04-11: qty 2

## 2019-04-11 MED ORDER — POLYETHYLENE GLYCOL 3350 17 G PO PACK
17.0000 g | PACK | Freq: Two times a day (BID) | ORAL | Status: DC
  Administered 2019-04-11 – 2019-04-28 (×25): 17 g via ORAL
  Filled 2019-04-11 (×29): qty 1

## 2019-04-11 MED ORDER — POLYETHYLENE GLYCOL 3350 17 G PO PACK
34.0000 g | PACK | Freq: Once | ORAL | Status: AC
  Administered 2019-04-11: 34 g via ORAL
  Filled 2019-04-11: qty 2

## 2019-04-11 MED ORDER — GUAIFENESIN-DM 100-10 MG/5ML PO SYRP: 10.0000 mL | ORAL_SOLUTION | ORAL | Status: DC | PRN

## 2019-04-11 MED ORDER — METHOCARBAMOL 500 MG PO TABS
1000.0000 mg | ORAL_TABLET | Freq: Four times a day (QID) | ORAL | Status: DC | PRN
  Administered 2019-04-13 – 2019-04-28 (×36): 1000 mg via ORAL
  Filled 2019-04-11 (×37): qty 2

## 2019-04-11 MED ORDER — HYDROCORTISONE 1 % EX CREA
1.0000 "application " | TOPICAL_CREAM | Freq: Three times a day (TID) | CUTANEOUS | Status: DC | PRN
  Filled 2019-04-11: qty 28

## 2019-04-11 MED ORDER — LIP MEDEX EX OINT
1.0000 "application " | TOPICAL_OINTMENT | Freq: Two times a day (BID) | CUTANEOUS | Status: DC
  Administered 2019-04-11 – 2019-04-28 (×35): 1 via TOPICAL
  Filled 2019-04-11: qty 7

## 2019-04-11 MED ORDER — HYDROCORTISONE (PERIANAL) 2.5 % EX CREA
1.0000 "application " | TOPICAL_CREAM | Freq: Four times a day (QID) | CUTANEOUS | Status: DC | PRN
  Filled 2019-04-11: qty 28.35

## 2019-04-11 MED ORDER — MAGIC MOUTHWASH
15.0000 mL | Freq: Four times a day (QID) | ORAL | Status: DC | PRN
  Filled 2019-04-11 (×2): qty 15

## 2019-04-11 MED ORDER — GABAPENTIN 300 MG PO CAPS
300.0000 mg | ORAL_CAPSULE | Freq: Two times a day (BID) | ORAL | Status: DC
  Administered 2019-04-11 – 2019-04-13 (×3): 300 mg via ORAL
  Filled 2019-04-11 (×6): qty 1

## 2019-04-11 MED ORDER — PHENOL 1.4 % MT LIQD: 1.0000 | OROMUCOSAL | Status: DC | PRN

## 2019-04-11 MED ORDER — MENTHOL 3 MG MT LOZG: 1.0000 | LOZENGE | OROMUCOSAL | Status: DC | PRN

## 2019-04-11 MED ORDER — ALUM & MAG HYDROXIDE-SIMETH 200-200-20 MG/5ML PO SUSP
30.0000 mL | Freq: Four times a day (QID) | ORAL | Status: DC | PRN
  Administered 2019-04-14 – 2019-04-24 (×5): 30 mL via ORAL
  Filled 2019-04-11 (×6): qty 30

## 2019-04-11 MED ORDER — BISACODYL 10 MG RE SUPP: 10.0000 mg | Freq: Two times a day (BID) | RECTAL | Status: DC | PRN

## 2019-04-11 MED ORDER — PROCHLORPERAZINE EDISYLATE 10 MG/2ML IJ SOLN: 5.0000 mg | INTRAMUSCULAR | Status: DC | PRN
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
## 2019-04-11 NOTE — Progress Notes (Signed)
Pt with new orders for pain control. Pt stated on initial assessment that he was in no pain. RN notified PA that pt is refusing Tylenol because of his liver. New order for increase in mg for Tylenol in which pt is still refusing. Pt refused miralax this morning, however, agreeable to take new one time order dose to help with bowel movement. Pt also refusing gabapentin stating that "I am not in any pain or having nerve spasms". Will continue to monitor pain control.
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
## 2019-04-12 LAB — CBC
HCT: 33.8 % — ABNORMAL LOW (ref 39.0–52.0)
Hemoglobin: 10.9 g/dL — ABNORMAL LOW (ref 13.0–17.0)
MCH: 29.9 pg (ref 26.0–34.0)
MCHC: 32.2 g/dL (ref 30.0–36.0)
MCV: 92.6 fL (ref 80.0–100.0)
Platelets: 468 10*3/uL — ABNORMAL HIGH (ref 150–400)
RBC: 3.65 MIL/uL — ABNORMAL LOW (ref 4.22–5.81)
RDW: 14.9 % (ref 11.5–15.5)
WBC: 7.5 10*3/uL (ref 4.0–10.5)
nRBC: 0 % (ref 0.0–0.2)

## 2019-04-12 LAB — BASIC METABOLIC PANEL
Anion gap: 12 (ref 5–15)
BUN: 14 mg/dL (ref 6–20)
CO2: 24 mmol/L (ref 22–32)
Calcium: 9.5 mg/dL (ref 8.9–10.3)
Chloride: 100 mmol/L (ref 98–111)
Creatinine, Ser: 0.83 mg/dL (ref 0.61–1.24)
GFR calc Af Amer: >60 mL/min (ref >60)
GFR calc non Af Amer: >60 mL/min (ref >60)
Glucose, Bld: 103 mg/dL — ABNORMAL HIGH (ref 70–99)
Potassium: 4.1 mmol/L (ref 3.5–5.1)
Sodium: 136 mmol/L (ref 135–145)

## 2019-04-12 MED ORDER — CHLORHEXIDINE GLUCONATE CLOTH 2 % EX PADS
6.0000 | MEDICATED_PAD | Freq: Every day | CUTANEOUS | Status: AC
  Administered 2019-04-12 – 2019-04-16 (×4): 6 via TOPICAL

## 2019-04-12 MED ORDER — VANCOMYCIN HCL 10 G IV SOLR
1500.0000 mg | Freq: Two times a day (BID) | INTRAVENOUS | Status: DC
  Administered 2019-04-13: 1500 mg via INTRAVENOUS
  Filled 2019-04-12 (×3): qty 1500

## 2019-04-12 MED ORDER — MUPIROCIN 2 % EX OINT
1.0000 "application " | TOPICAL_OINTMENT | Freq: Two times a day (BID) | CUTANEOUS | Status: AC
  Administered 2019-04-12 – 2019-04-16 (×10): 1 via NASAL
  Filled 2019-04-12: qty 22

## 2019-04-12 MED ORDER — VANCOMYCIN HCL 10 G IV SOLR
2000.0000 mg | Freq: Once | INTRAVENOUS | Status: AC
  Administered 2019-04-12: 2000 mg via INTRAVENOUS
  Filled 2019-04-12: qty 2000
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
## 2019-04-12 NOTE — Progress Notes (Signed)
CSW called and spoke with Revonda Standard with [facility] and [employer]. She stated that she would look at the patient's LOG application. She needed to know if disability had been started. CSW was not able to find it in the chart. CSW called [facility] offices to get an answer. FC leave at 2:00pm due to COVID staffing hours.   CSW will follow up with FC in the morning to see if disability has been started. CSW will follow up with Revonda Standard to give an answer on the status of the patient's disability application.   CSW will continue to follow.   Drucilla Schmidt, MSW, LCSW-A Clinical Social Worker [employer]

## 2019-04-13 ENCOUNTER — Inpatient Hospital Stay (HOSPITAL_COMMUNITY): Payer: Medicaid Other | Admitting: Certified Registered Nurse Anesthetist

## 2019-04-13 ENCOUNTER — Encounter (HOSPITAL_COMMUNITY): Payer: Self-pay

## 2019-04-13 ENCOUNTER — Encounter (HOSPITAL_COMMUNITY): Admission: EM | Disposition: A | Discharge status: Skilled Nursing Facility | Payer: Self-pay | Source: Home / Self Care

## 2019-04-13 HISTORY — PX: I & D EXTREMITY: SHX5045

## 2019-04-13 HISTORY — PX: AMPUTATION: SHX166

## 2019-04-13 SURGERY — IRRIGATION AND DEBRIDEMENT EXTREMITY
Anesthesia: General | Laterality: Right

## 2019-04-13 MED ORDER — BUPIVACAINE-EPINEPHRINE 0.5% -1:200000 IJ SOLN
INTRAMUSCULAR | Status: AC
  Filled 2019-04-13: qty 1

## 2019-04-13 MED ORDER — MIDAZOLAM HCL 2 MG/2ML IJ SOLN
INTRAMUSCULAR | Status: AC
  Filled 2019-04-13: qty 2

## 2019-04-13 MED ORDER — SUCCINYLCHOLINE CHLORIDE 200 MG/10ML IV SOSY
PREFILLED_SYRINGE | INTRAVENOUS | Status: AC
  Filled 2019-04-13: qty 10

## 2019-04-13 MED ORDER — SODIUM CHLORIDE 0.9 % IV SOLN
2.0000 g | INTRAVENOUS | Status: AC
  Administered 2019-04-13: 13:00:00 2 g via INTRAVENOUS
  Filled 2019-04-13: qty 2

## 2019-04-13 MED ORDER — ROCURONIUM BROMIDE 50 MG/5ML IV SOSY
PREFILLED_SYRINGE | INTRAVENOUS | Status: AC
  Filled 2019-04-13: qty 5

## 2019-04-13 MED ORDER — FENTANYL CITRATE (PF) 250 MCG/5ML IJ SOLN
INTRAMUSCULAR | Status: DC | PRN
  Administered 2019-04-13 (×2): 50 ug via INTRAVENOUS
  Administered 2019-04-13 (×3): 25 ug via INTRAVENOUS
  Administered 2019-04-13: 50 ug via INTRAVENOUS
  Administered 2019-04-13: 25 ug via INTRAVENOUS

## 2019-04-13 MED ORDER — LIDOCAINE 2% (20 MG/ML) 5 ML SYRINGE
INTRAMUSCULAR | Status: AC
  Filled 2019-04-13: qty 5

## 2019-04-13 MED ORDER — MIDAZOLAM HCL 2 MG/2ML IJ SOLN
INTRAMUSCULAR | Status: DC | PRN
  Administered 2019-04-13: 2 mg via INTRAVENOUS

## 2019-04-13 MED ORDER — ACETAMINOPHEN 500 MG PO TABS: 1000.0000 mg | ORAL_TABLET | Freq: Once | ORAL | Status: DC

## 2019-04-13 MED ORDER — 0.9 % SODIUM CHLORIDE (POUR BTL) OPTIME
TOPICAL | Status: DC | PRN
  Administered 2019-04-13: 1000 mL

## 2019-04-13 MED ORDER — DEXMEDETOMIDINE HCL IN NACL 200 MCG/50ML IV SOLN
INTRAVENOUS | Status: AC
  Filled 2019-04-13: qty 50

## 2019-04-13 MED ORDER — DEXAMETHASONE SODIUM PHOSPHATE 10 MG/ML IJ SOLN
INTRAMUSCULAR | Status: DC | PRN
  Administered 2019-04-13: 5 mg via INTRAVENOUS

## 2019-04-13 MED ORDER — BUPIVACAINE-EPINEPHRINE 0.5% -1:200000 IJ SOLN
INTRAMUSCULAR | Status: DC | PRN
  Administered 2019-04-13: 10 mL

## 2019-04-13 MED ORDER — SUGAMMADEX SODIUM 200 MG/2ML IV SOLN
INTRAVENOUS | Status: DC | PRN
  Administered 2019-04-13: 195.8 mg via INTRAVENOUS

## 2019-04-13 MED ORDER — FENTANYL CITRATE (PF) 250 MCG/5ML IJ SOLN
INTRAMUSCULAR | Status: AC
  Filled 2019-04-13: qty 5

## 2019-04-13 MED ORDER — PROPOFOL 10 MG/ML IV BOLUS
INTRAVENOUS | Status: AC
  Filled 2019-04-13: qty 20

## 2019-04-13 MED ORDER — PROPOFOL 10 MG/ML IV BOLUS
INTRAVENOUS | Status: DC | PRN
  Administered 2019-04-13: 130 mg via INTRAVENOUS

## 2019-04-13 MED ORDER — LIDOCAINE 2% (20 MG/ML) 5 ML SYRINGE
INTRAMUSCULAR | Status: DC | PRN
  Administered 2019-04-13: 60 mg via INTRAVENOUS

## 2019-04-13 MED ORDER — SUCCINYLCHOLINE CHLORIDE 200 MG/10ML IV SOSY
PREFILLED_SYRINGE | INTRAVENOUS | Status: DC | PRN
  Administered 2019-04-13: 110 mg via INTRAVENOUS

## 2019-04-13 MED ORDER — ROCURONIUM BROMIDE 50 MG/5ML IV SOSY
PREFILLED_SYRINGE | INTRAVENOUS | Status: DC | PRN
  Administered 2019-04-13: 20 mg via INTRAVENOUS
  Administered 2019-04-13: 30 mg via INTRAVENOUS

## 2019-04-13 MED ORDER — DEXAMETHASONE SODIUM PHOSPHATE 10 MG/ML IJ SOLN
INTRAMUSCULAR | Status: AC
  Filled 2019-04-13: qty 1

## 2019-04-13 MED ORDER — ONDANSETRON HCL 4 MG/2ML IJ SOLN
INTRAMUSCULAR | Status: AC
  Filled 2019-04-13: qty 2

## 2019-04-13 MED ORDER — SODIUM CHLORIDE 0.9 % IV SOLN
1.0000 g | Freq: Three times a day (TID) | INTRAVENOUS | Status: DC
  Administered 2019-04-13 – 2019-04-16 (×9): 1 g via INTRAVENOUS
  Filled 2019-04-13 (×13): qty 1

## 2019-04-13 MED ORDER — DEXMEDETOMIDINE HCL 200 MCG/2ML IV SOLN
INTRAVENOUS | Status: DC | PRN
  Administered 2019-04-13 (×2): 8 ug via INTRAVENOUS
  Administered 2019-04-13: 4 ug via INTRAVENOUS
  Administered 2019-04-13: 8 ug via INTRAVENOUS

## 2019-04-13 MED ORDER — ONDANSETRON HCL 4 MG/2ML IJ SOLN
INTRAMUSCULAR | Status: DC | PRN
  Administered 2019-04-13: 4 mg via INTRAVENOUS

## 2019-04-13 SURGICAL SUPPLY — 84 items
BANDAGE ESMARK 6X9 LF (GAUZE/BANDAGES/DRESSINGS) ×1 IMPLANT
BLADE SAW SGTL 83.5X18.5 (BLADE) ×3 IMPLANT
BLADE SURG 10 STRL SS (BLADE) ×3 IMPLANT
BNDG COHESIVE 4X5 TAN STRL (GAUZE/BANDAGES/DRESSINGS) IMPLANT
BNDG COHESIVE 6X5 TAN STRL LF (GAUZE/BANDAGES/DRESSINGS) ×3 IMPLANT
BNDG ELASTIC 6X15 VLCR STRL LF (GAUZE/BANDAGES/DRESSINGS) ×3 IMPLANT
BNDG ESMARK 6X9 LF (GAUZE/BANDAGES/DRESSINGS) ×3
BNDG GAUZE ELAST 4 BULKY (GAUZE/BANDAGES/DRESSINGS) ×6 IMPLANT
BNDG GAUZE STRTCH 6 (GAUZE/BANDAGES/DRESSINGS) ×9 IMPLANT
BRUSH SCRUB SURG 4.25 DISP (MISCELLANEOUS) ×6 IMPLANT
CANISTER WOUND CARE 500ML ATS (WOUND CARE) ×3 IMPLANT
COVER MAYO STAND STRL (DRAPES) IMPLANT
COVER SURGICAL LIGHT HANDLE (MISCELLANEOUS) IMPLANT
COVER WAND RF STERILE (DRAPES) IMPLANT
CUFF TOURNIQUET SINGLE 34IN LL (TOURNIQUET CUFF) ×3 IMPLANT
CUFF TOURNIQUET SINGLE 44IN (TOURNIQUET CUFF) IMPLANT
DECANTER SPIKE VIAL GLASS SM (MISCELLANEOUS) ×3 IMPLANT
DRAIN PENROSE 1/2X12 LTX STRL (WOUND CARE) IMPLANT
DRAPE C-ARMOR (DRAPES) IMPLANT
DRAPE HALF SHEET 40X57 (DRAPES) ×3 IMPLANT
DRAPE U-SHAPE 47X51 STRL (DRAPES) IMPLANT
DRSG ADAPTIC 3X8 NADH LF (GAUZE/BANDAGES/DRESSINGS) ×3 IMPLANT
DRSG MEPITEL 4X7.2 (GAUZE/BANDAGES/DRESSINGS) ×3 IMPLANT
DRSG VAC ATS MED SENSATRAC (GAUZE/BANDAGES/DRESSINGS) ×3 IMPLANT
ELECT REM PT RETURN 9FT ADLT (ELECTROSURGICAL) ×3
ELECTRODE REM PT RTRN 9FT ADLT (ELECTROSURGICAL) ×1 IMPLANT
EVACUATOR 1/8 PVC DRAIN (DRAIN) IMPLANT
GAUZE SPONGE 4X4 12PLY STRL (GAUZE/BANDAGES/DRESSINGS) ×3 IMPLANT
GLOVE BIO SURGEON STRL SZ7.5 (GLOVE) ×3 IMPLANT
GLOVE BIO SURGEON STRL SZ8 (GLOVE) ×3 IMPLANT
GLOVE BIOGEL PI IND STRL 7.0 (GLOVE) ×1 IMPLANT
GLOVE BIOGEL PI IND STRL 7.5 (GLOVE) ×3 IMPLANT
GLOVE BIOGEL PI IND STRL 8 (GLOVE) ×1 IMPLANT
GLOVE BIOGEL PI INDICATOR 7.0 (GLOVE) ×2
GLOVE BIOGEL PI INDICATOR 7.5 (GLOVE) ×6
GLOVE BIOGEL PI INDICATOR 8 (GLOVE) ×2
GOWN STRL REUS W/ TWL LRG LVL3 (GOWN DISPOSABLE) ×3 IMPLANT
GOWN STRL REUS W/ TWL XL LVL3 (GOWN DISPOSABLE) ×1 IMPLANT
GOWN STRL REUS W/TWL LRG LVL3 (GOWN DISPOSABLE) ×6
GOWN STRL REUS W/TWL XL LVL3 (GOWN DISPOSABLE) ×2
HANDPIECE INTERPULSE COAX TIP (DISPOSABLE)
KIT BASIN OR (CUSTOM PROCEDURE TRAY) ×3 IMPLANT
KIT TURNOVER KIT B (KITS) ×3 IMPLANT
MANIFOLD NEPTUNE II (INSTRUMENTS) IMPLANT
NEEDLE HYPO 25X1 1.5 SAFETY (NEEDLE) ×3 IMPLANT
NS IRRIG 1000ML POUR BTL (IV SOLUTION) ×3 IMPLANT
PACK GENERAL/GYN (CUSTOM PROCEDURE TRAY) IMPLANT
PACK ORTHO EXTREMITY (CUSTOM PROCEDURE TRAY) ×3 IMPLANT
PAD ARMBOARD 7.5X6 YLW CONV (MISCELLANEOUS) ×6 IMPLANT
PAD CAST 3X4 CTTN HI CHSV (CAST SUPPLIES) ×3 IMPLANT
PAD NEG PRESSURE SENSATRAC (MISCELLANEOUS) ×3 IMPLANT
PADDING CAST COTTON 3X4 STRL (CAST SUPPLIES) ×6
PADDING CAST COTTON 6X4 STRL (CAST SUPPLIES) ×3 IMPLANT
RETRIEVER SUT HEWSON (MISCELLANEOUS) ×3 IMPLANT
SET HNDPC FAN SPRY TIP SCT (DISPOSABLE) IMPLANT
SPONGE LAP 18X18 RF (DISPOSABLE) ×3 IMPLANT
STAPLER VISISTAT 35W (STAPLE) ×6 IMPLANT
STOCKINETTE IMPERVIOUS 9X36 MD (GAUZE/BANDAGES/DRESSINGS) ×3 IMPLANT
STOCKINETTE IMPERVIOUS LG (DRAPES) IMPLANT
SUT ETHILON 2 0 PSLX (SUTURE) IMPLANT
SUT FIBERWIRE 2-0 18 17.9 3/8 (SUTURE) ×3
SUT PDS AB 1 CTX 36 (SUTURE) IMPLANT
SUT PDS AB 2-0 CT1 27 (SUTURE) IMPLANT
SUT PROLENE 0 CT 1 30 (SUTURE) ×3 IMPLANT
SUT SILK 2 0 (SUTURE)
SUT SILK 2 0 SH CR/8 (SUTURE) ×3 IMPLANT
SUT SILK 2-0 18XBRD TIE 12 (SUTURE) IMPLANT
SUT VIC AB 0 CT1 27 (SUTURE)
SUT VIC AB 0 CT1 27XBRD ANBCTR (SUTURE) IMPLANT
SUT VIC AB 1 CT1 27 (SUTURE) ×4
SUT VIC AB 1 CT1 27XBRD ANBCTR (SUTURE) ×2 IMPLANT
SUT VIC AB 2-0 CT1 27 (SUTURE) ×4
SUT VIC AB 2-0 CT1 TAPERPNT 27 (SUTURE) ×2 IMPLANT
SUTURE FIBERWR 2-0 18 17.9 3/8 (SUTURE) ×1 IMPLANT
SWAB COLLECTION DEVICE MRSA (MISCELLANEOUS) IMPLANT
SWAB CULTURE ESWAB REG 1ML (MISCELLANEOUS) IMPLANT
SYR CONTROL 10ML LL (SYRINGE) ×3 IMPLANT
TOWEL OR 17X24 6PK STRL BLUE (TOWEL DISPOSABLE) ×3 IMPLANT
TOWEL OR 17X26 10 PK STRL BLUE (TOWEL DISPOSABLE) ×3 IMPLANT
TUBE CONNECTING 12'X1/4 (SUCTIONS) ×1
TUBE CONNECTING 12X1/4 (SUCTIONS) ×2 IMPLANT
UNDERPAD 30X30 (UNDERPADS AND DIAPERS) ×3 IMPLANT
WATER STERILE IRR 1000ML POUR (IV SOLUTION) ×3 IMPLANT
YANKAUER SUCT BULB TIP NO VENT (SUCTIONS) ×3 IMPLANT
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
## 2019-04-13 NOTE — Transfer of Care (Signed)
Immediate Anesthesia Transfer of Care Note  Patient: Travis Benson  Procedure(s) Performed: IRRIGATION AND DEBRIDEMENT EXTREMITY Right Leg (Right ) AMPUTATION ABOVE KNEE (Right )  Patient Location: PACU  Anesthesia Type:General  Level of Consciousness: drowsy and patient cooperative  Airway & Oxygen Therapy: Patient Spontanous Breathing  Post-op Assessment: Report given to RN and Post -op Vital signs reviewed and stable  Post vital signs: Reviewed and stable  Last Vitals:  Vitals Value Taken Time  BP    Temp    Pulse 101 04/13/2019  3:23 PM  Resp 29 04/13/2019  3:23 PM  SpO2 96 % 04/13/2019  3:23 PM  Vitals shown include unvalidated device data.  Last Pain:  Vitals:   04/13/19 0800  TempSrc:   PainSc: 0-No pain      Patients Stated Pain Goal: 0 (03/31/19 2000)  Complications: No apparent anesthesia complications

## 2019-04-13 NOTE — Anesthesia Preprocedure Evaluation (Addendum)
Anesthesia Evaluation  Patient identified by MRN, date of birth, ID band Patient awake    Reviewed: Allergy & Precautions, H&P , NPO status , Patient's Chart, lab work & pertinent test results  Airway Mallampati: II  TM Distance: >3 FB Neck ROM: Full    Dental no notable dental hx. (+) Teeth Intact   Pulmonary neg pulmonary ROS,    Pulmonary exam normal breath sounds clear to auscultation       Cardiovascular hypertension, + CAD   Rhythm:Regular Rate:Normal     Neuro/Psych negative neurological ROS  negative psych ROS   GI/Hepatic negative GI ROS, Neg liver ROS,   Endo/Other  negative endocrine ROS  Renal/GU negative Renal ROS  negative genitourinary   Musculoskeletal   Abdominal   Peds  Hematology negative hematology ROS (+)   Anesthesia Other Findings   Reproductive/Obstetrics negative OB ROS                           Anesthesia Physical Anesthesia Plan  ASA: III  Anesthesia Plan: General   Post-op Pain Management:    Induction: Intravenous  PONV Risk Score and Plan: 3 and Ondansetron, Dexamethasone and Midazolam  Airway Management Planned: Oral ETT  Additional Equipment:   Intra-op Plan:   Post-operative Plan: Extubation in OR  Informed Consent: I have reviewed the patients History and Physical, chart, labs and discussed the procedure including the risks, benefits and alternatives for the proposed anesthesia with the patient or authorized representative who has indicated his/her understanding and acceptance.     Dental advisory given  Plan Discussed with: CRNA and Anesthesiologist  Anesthesia Plan Comments:        Anesthesia Quick Evaluation
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
## 2019-04-13 NOTE — Anesthesia Procedure Notes (Signed)
Procedure Name: Intubation Date/Time: 04/13/2019 12:39 PM Performed by: Modena Morrow, CRNA Pre-anesthesia Checklist: Patient identified, Emergency Drugs available, Suction available, Patient being monitored and Timeout performed Patient Re-evaluated:Patient Re-evaluated prior to induction Oxygen Delivery Method: Circle system utilized Preoxygenation: Pre-oxygenation with 100% oxygen Induction Type: IV induction, Rapid sequence and Cricoid Pressure applied Laryngoscope Size: Miller and 2 Grade View: Grade II Tube type: Oral Tube size: 7.5 mm Number of attempts: 1 Airway Equipment and Method: Stylet Placement Confirmation: ETT inserted through vocal cords under direct vision,  positive ETCO2 and breath sounds checked- equal and bilateral Secured at: 22 cm Tube secured with: Tape Dental Injury: Teeth and Oropharynx as per pre-operative assessment

## 2019-04-13 NOTE — Progress Notes (Signed)
SLP Cancellation Note  Patient Details Name: Travis Benson MRN: [id number] DOB: March 17, 1961   Cancelled treatment:       Reason Eval/Treat Not Completed: Patient at procedure or test/unavailable. Pt about to leave unit for surgery. SLP will re-attempt as able.   [name] I. Vear Clock, MS, CCC-SLP Acute Rehabilitation Services Office number [phone] Pager [phone]  [name] 04/13/2019, 11:06 AM

## 2019-04-13 NOTE — Progress Notes (Signed)
Inpatient Rehabilitation Admissions Coordinator  Received re consult for an inpt rehab admission. Noted plans for AKA. I contacted Carlena Bjornstad PA to discuss case. Wife works nights and has no caregiver support to assist in his care. I will follow pt's progress postoperative, but unless patient felt can get to Mod I level after a CIR admit, he will need SNF rehab as planned to give longer recovery before eventual d/c home with wife with intermittent assist . I contacted RN CM, Raynelle Fanning and she is aware. I left a voicemail for SW, [place], to discuss case. I will follow. I would continue to pursue SNF in the meantime as I monitor his progress.  Ottie Glazier, RN, MSN Rehab Admissions Coordinator [phone] 04/13/2019 10:55 AM

## 2019-04-13 NOTE — Progress Notes (Signed)
I have spoken twice in the last 24 hrs and at length with the patient's wife regarding the decision for right above knee amputation. I have confirmed today with the patient his decision for AKA and performed a conference call including his wife and best friend. This is a sound decision that offers the most potential function, least risk of complications, and the least risk of pain given the patient's traumatic loss of venous return (with reconstructed arterial flow), unstable right knee joint, loss of passive motion of the ankle from compartment syndrome and scarring, loss of muscle function secondary to nerve injury, and loss of protective sensation, in addition to deep infection.  I formally discussed with the patient and his wife the risks and benefits of right above knee amputation, including the possibility of infection, nerve injury, vessel injury, wound breakdown, neuropathic pain, DVT/ PE, loss of motion, and need for further surgery among others.  He acknowledged these risks and provided consent to proceed.  Myrene Galas, MD [facility], [facility] [phone]

## 2019-04-14 ENCOUNTER — Encounter (HOSPITAL_COMMUNITY): Payer: Self-pay | Admitting: Orthopedic Surgery

## 2019-04-14 LAB — BASIC METABOLIC PANEL
Anion gap: 9 (ref 5–15)
BUN: 12 mg/dL (ref 6–20)
CO2: 24 mmol/L (ref 22–32)
Calcium: 8.7 mg/dL — ABNORMAL LOW (ref 8.9–10.3)
Chloride: 101 mmol/L (ref 98–111)
Creatinine, Ser: 0.84 mg/dL (ref 0.61–1.24)
GFR calc Af Amer: >60 mL/min (ref >60)
GFR calc non Af Amer: >60 mL/min (ref >60)
Glucose, Bld: 111 mg/dL — ABNORMAL HIGH (ref 70–99)
Potassium: 4.2 mmol/L (ref 3.5–5.1)
Sodium: 134 mmol/L — ABNORMAL LOW (ref 135–145)

## 2019-04-14 LAB — AEROBIC/ANAEROBIC CULTURE W GRAM STAIN (SURGICAL/DEEP WOUND)

## 2019-04-14 LAB — CBC
HCT: 32.5 % — ABNORMAL LOW (ref 39.0–52.0)
Hemoglobin: 10.6 g/dL — ABNORMAL LOW (ref 13.0–17.0)
MCH: 30.2 pg (ref 26.0–34.0)
MCHC: 32.6 g/dL (ref 30.0–36.0)
MCV: 92.6 fL (ref 80.0–100.0)
Platelets: 463 10*3/uL — ABNORMAL HIGH (ref 150–400)
RBC: 3.51 MIL/uL — ABNORMAL LOW (ref 4.22–5.81)
RDW: 14.9 % (ref 11.5–15.5)
WBC: 11.6 10*3/uL — ABNORMAL HIGH (ref 4.0–10.5)
nRBC: 0 % (ref 0.0–0.2)

## 2019-04-14 LAB — AEROBIC/ANAEROBIC CULTURE (SURGICAL/DEEP WOUND)

## 2019-04-14 MED ORDER — MORPHINE SULFATE (PF) 2 MG/ML IV SOLN
1.0000 mg | INTRAVENOUS | Status: DC | PRN
  Administered 2019-04-14 – 2019-04-23 (×10): 1 mg via INTRAVENOUS
  Filled 2019-04-14 (×10): qty 1

## 2019-04-14 MED ORDER — ACETAMINOPHEN 500 MG PO TABS
500.0000 mg | ORAL_TABLET | Freq: Four times a day (QID) | ORAL | Status: DC | PRN
  Filled 2019-04-14 (×2): qty 1

## 2019-04-14 MED ORDER — ACETAMINOPHEN 500 MG PO TABS: 500.0000 mg | ORAL_TABLET | Freq: Four times a day (QID) | ORAL | Status: DC

## 2019-04-14 MED ORDER — GABAPENTIN 300 MG PO CAPS
300.0000 mg | ORAL_CAPSULE | Freq: Three times a day (TID) | ORAL | Status: DC
  Administered 2019-04-14 – 2019-04-28 (×43): 300 mg via ORAL
  Filled 2019-04-14 (×20): qty 1
  Filled 2019-04-14: qty 3
  Filled 2019-04-14 (×22): qty 1

## 2019-04-14 NOTE — Progress Notes (Signed)
Patient resting comfortably on room air. BIPAP is not needed at this time. RT will monitor as needed. 
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
## 2019-04-14 NOTE — Progress Notes (Signed)
Orthopedic Tech Progress Note Patient Details:  Travis Benson 06-Feb-1961 [id number] Called in order to [facility] Patient ID: [name], male   DOB: 1961-11-22, 58 y.o.   MRN: [id number]   [name] 04/14/2019, 9:16 AM

## 2019-04-14 NOTE — Plan of Care (Signed)
  Problem: Education: Goal: Knowledge of the prescribed therapeutic regimen will improve Outcome: Progressing Goal: Ability to verbalize activity precautions or restrictions will improve Outcome: Progressing Goal: Understanding of discharge needs will improve Outcome: Progressing   Problem: Activity: Goal: Ability to perform//tolerate increased activity and mobilize with assistive devices will improve Outcome: Progressing   Problem: Clinical Measurements: Goal: Postoperative complications will be avoided or minimized Outcome: Progressing   Problem: Self-Care: Goal: Ability to meet self-care needs will improve Outcome: Progressing   Problem: Self-Concept: Goal: Ability to maintain and perform role responsibilities to the fullest extent possible will improve Outcome: Progressing   Problem: Pain Management: Goal: Pain level will decrease with appropriate interventions Outcome: Progressing   Problem: Skin Integrity: Goal: Demonstration of wound healing without infection will improve Outcome: Progressing   

## 2019-04-14 NOTE — Anesthesia Postprocedure Evaluation (Signed)
Anesthesia Post Note  Patient: Travis Benson  Procedure(s) Performed: IRRIGATION AND DEBRIDEMENT EXTREMITY Right Leg (Right ) AMPUTATION ABOVE KNEE (Right )     Patient location during evaluation: PACU Anesthesia Type: General Level of consciousness: awake and alert Pain management: pain level controlled Vital Signs Assessment: post-procedure vital signs reviewed and stable Respiratory status: spontaneous breathing, nonlabored ventilation, respiratory function stable and patient connected to nasal cannula oxygen Cardiovascular status: blood pressure returned to baseline and stable Postop Assessment: no apparent nausea or vomiting Anesthetic complications: no    Last Vitals:  Vitals:   04/14/19 0354 04/14/19 0749  BP: (!) 147/78 (!) 149/81  Pulse: 83 92  Resp:  16  Temp: 36.7 C 36.8 C  SpO2: 93% 94%    Last Pain:  Vitals:   04/14/19 0749  TempSrc: Oral  PainSc:                  Gaege Sangalang S
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
## 2019-04-14 NOTE — Progress Notes (Signed)
Inpatient Rehabilitation Admissions Coordinator  Discussed postoperative evaluation with Rayfield Citizen, PT. Need to see if pt could be expected to become Mod I with a 2 week CIR admission. If not, due to lack of 24/7 Caregiver support, pt will need SNF as planned. I will follow.  Ottie Glazier, RN, MSN Rehab Admissions Coordinator [phone] 04/14/2019 1:54 PM
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
## 2019-04-15 NOTE — Progress Notes (Signed)
Inpatient Rehabilitation Admissions Coordinator  I spoke with patient's wife by phone to discuss available caregiver support at home. She is a CNA who works nights Saturday and Sunday as well as mornings Monday through Friday. Her 60 year old daughter works nights. She states preference for SNF rehab to give her husband longer recovery before returning home for she is unable to arrange 24/7 assist at home which he will need. He will not get Mod I at wheelchair level after a CIR admit of 2 weeks. She asks that SW contact her to give her updates on bed offers for SNF. I will contact SW and we will sign off.   Ottie Glazier, RN, MSN Rehab Admissions Coordinator [phone] 04/15/2019 11:36 AM
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
## 2019-04-16 ENCOUNTER — Encounter (HOSPITAL_COMMUNITY): Admission: EM | Disposition: A | Discharge status: Skilled Nursing Facility | Payer: Self-pay | Source: Home / Self Care

## 2019-04-16 LAB — CBC
HCT: 34.7 % — ABNORMAL LOW (ref 39.0–52.0)
Hemoglobin: 11 g/dL — ABNORMAL LOW (ref 13.0–17.0)
MCH: 29.5 pg (ref 26.0–34.0)
MCHC: 31.7 g/dL (ref 30.0–36.0)
MCV: 93 fL (ref 80.0–100.0)
Platelets: 495 10*3/uL — ABNORMAL HIGH (ref 150–400)
RBC: 3.73 MIL/uL — ABNORMAL LOW (ref 4.22–5.81)
RDW: 14.3 % (ref 11.5–15.5)
WBC: 9.6 10*3/uL (ref 4.0–10.5)
nRBC: 0 % (ref 0.0–0.2)

## 2019-04-16 SURGERY — AMPUTATION, ABOVE KNEE
Anesthesia: General | Laterality: Right

## 2019-04-16 NOTE — TOC Progression Note (Signed)
Transition of Care [facility]) - Progression Note    Patient Details  Name: Travis Benson MRN: [id number] Date of Birth: [birth year]  Transition of [facility]) CM/SW Contact  Doy Hutching, [place] Phone Number: 04/16/2019, 4:26 PM  Clinical Narrative:    CSW spoke with Revonda Standard at [facility]- she is aware pt will have Medicaid and Disability submitted through [facility] Monday (both applications completed by pt).  They will have to review pt clinicals with corporate leadership and will f/u with trauma CSW regarding determination, bed availability also slim at this time.    Expected Discharge Plan: Skilled Nursing Facility Barriers to Discharge: Continued Medical Work up  Expected Discharge Plan and Services Expected Discharge Plan: Skilled Nursing Facility In-house Referral: Clinical Social Work Discharge Planning Services: CM Consult   Living arrangements for the past 2 months: Single Family Home                   Social Determinants of Health (SDOH) Interventions    Readmission Risk Interventions No flowsheet data found.
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
## 2019-04-16 NOTE — Progress Notes (Signed)
SLP Cancellation Note  Patient Details Name: Travis Benson MRN: [id number] DOB: 05-17-1961   Cancelled treatment:       Reason Eval/Treat Not Completed: Patient declined, no reason specified. Pt was approached for therapy but he indicated that he has been in pain. He stated that he was recently given medication for the pain and would therefore like to rest at this time. He requested that SLP re-attempt tx this afternoon and SLP will re-attempt as able.   Maycie Luera I. Vear Clock, MS, CCC-SLP Acute Rehabilitation Services Office number [phone] Pager [phone]  [name] 04/16/2019, 10:30 AM
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
## 2019-04-17 ENCOUNTER — Encounter (HOSPITAL_COMMUNITY): Payer: Self-pay | Admitting: Orthopedic Surgery

## 2019-04-17 NOTE — Plan of Care (Signed)
  Problem: Education: Goal: Knowledge of the prescribed therapeutic regimen will improve Outcome: Progressing Goal: Ability to verbalize activity precautions or restrictions will improve Outcome: Progressing Goal: Understanding of discharge needs will improve Outcome: Progressing   Problem: Activity: Goal: Ability to perform//tolerate increased activity and mobilize with assistive devices will improve Outcome: Progressing   Problem: Clinical Measurements: Goal: Postoperative complications will be avoided or minimized Outcome: Progressing   Problem: Self-Care: Goal: Ability to meet self-care needs will improve Outcome: Progressing   Problem: Self-Concept: Goal: Ability to maintain and perform role responsibilities to the fullest extent possible will improve Outcome: Progressing   Problem: Pain Management: Goal: Pain level will decrease with appropriate interventions Outcome: Progressing   Problem: Skin Integrity: Goal: Demonstration of wound healing without infection will improve Outcome: Progressing   
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
## 2019-04-18 LAB — CBC
HCT: 37.2 % — ABNORMAL LOW (ref 39.0–52.0)
Hemoglobin: 11.7 g/dL — ABNORMAL LOW (ref 13.0–17.0)
MCH: 29.9 pg (ref 26.0–34.0)
MCHC: 31.5 g/dL (ref 30.0–36.0)
MCV: 95.1 fL (ref 80.0–100.0)
Platelets: 397 10*3/uL (ref 150–400)
RBC: 3.91 MIL/uL — ABNORMAL LOW (ref 4.22–5.81)
RDW: 14.1 % (ref 11.5–15.5)
WBC: 7.4 10*3/uL (ref 4.0–10.5)
nRBC: 0 % (ref 0.0–0.2)

## 2019-04-18 LAB — COMPREHENSIVE METABOLIC PANEL
ALT: 34 U/L (ref 0–44)
AST: 36 U/L (ref 15–41)
Albumin: 2.3 g/dL — ABNORMAL LOW (ref 3.5–5.0)
Alkaline Phosphatase: 151 U/L — ABNORMAL HIGH (ref 38–126)
Anion gap: 12 (ref 5–15)
BUN: 16 mg/dL (ref 6–20)
CO2: 24 mmol/L (ref 22–32)
Calcium: 9.2 mg/dL (ref 8.9–10.3)
Chloride: 96 mmol/L — ABNORMAL LOW (ref 98–111)
Creatinine, Ser: 0.74 mg/dL (ref 0.61–1.24)
GFR calc Af Amer: >60 mL/min (ref >60)
GFR calc non Af Amer: >60 mL/min (ref >60)
Glucose, Bld: 104 mg/dL — ABNORMAL HIGH (ref 70–99)
Potassium: 5 mmol/L (ref 3.5–5.1)
Sodium: 132 mmol/L — ABNORMAL LOW (ref 135–145)
Total Bilirubin: 0.6 mg/dL (ref 0.3–1.2)
Total Protein: 7.5 g/dL (ref 6.5–8.1)

## 2019-04-18 LAB — HEPATITIS PANEL, ACUTE
HCV Ab: >11 s/co ratio — ABNORMAL HIGH (ref 0.0–0.9)
Hep A IgM: NEGATIVE
Hep B C IgM: NEGATIVE
Hepatitis B Surface Ag: NEGATIVE

## 2019-04-18 NOTE — Progress Notes (Signed)
Patient ID: Travis Benson, male   DOB: [birth year], 58 y.o.   MRN: [id number] [facility] Progress Note:   5 Days Post-Op  Subjective: Mental status is alert;  Wants wife to bring in food from [place] (unfortunately we cannot) Objective: Vital signs in last 24 hours: Temp:  [98.1 F (36.7 C)-98.9 F (37.2 C)] 98.4 F (36.9 C) (04/19 0700) Pulse Rate:  [87-100] 87 (04/19 0700) Resp:  [16-20] 20 (04/19 0700) BP: (122-146)/(77-92) 139/90 (04/19 0700) SpO2:  [98 %] 98 % (04/19 0700) Weight:  [92.3 kg] 92.3 kg (04/19 0437)  Intake/Output from previous day: 04/18 0701 - 04/19 0700 In: -  Out: 1950 [Urine:1950] Intake/Output this shift: Total I/O In: 240 [P.O.:240] Out: 225 [Urine:225]  Physical Exam: Work of breathing is normal.  Right AKA stump OK  Lab Results:  Results for orders placed or performed during the hospital encounter of 02/28/19 (from the past 48 hour(s))  Hepatitis panel, acute     Status: Abnormal   Collection Time: 04/17/19  3:12 PM  Result Value Ref Range   Hepatitis B Surface Ag Negative Negative   HCV Ab >11.0 (H) 0.0 - 0.9 s/co ratio    Comment: (NOTE)                                  Negative:     < 0.8                             Indeterminate: 0.8 - 0.9                                  Positive:     > 0.9 The CDC recommends that a positive HCV antibody result be followed up with a HCV Nucleic Acid Amplification test (409811). Performed At: [facility] [address] [place], [place] [id number] Jolene Schimke MD OZ:[id number]    Hep A IgM Negative Negative   Hep B C IgM Negative Negative  CBC     Status: Abnormal   Collection Time: 04/18/19  4:19 AM  Result Value Ref Range   WBC 7.4 4.0 - 10.5 K/uL   RBC 3.91 (L) 4.22 - 5.81 MIL/uL   Hemoglobin 11.7 (L) 13.0 - 17.0 g/dL   HCT 62.9 (L) 52.8 - 41.3 %   MCV 95.1 80.0 - 100.0 fL   MCH 29.9 26.0 - 34.0 pg   MCHC 31.5 30.0 - 36.0 g/dL   RDW 24.4 01.0 - 27.2 %   Platelets 397  150 - 400 K/uL   nRBC 0.0 0.0 - 0.2 %    Comment: Performed at [facility], [address], [place], [place] [address]  Comprehensive metabolic panel     Status: Abnormal   Collection Time: 04/18/19  4:19 AM  Result Value Ref Range   Sodium 132 (L) 135 - 145 mmol/L   Potassium 5.0 3.5 - 5.1 mmol/L   Chloride 96 (L) 98 - 111 mmol/L   CO2 24 22 - 32 mmol/L   Glucose, Bld 104 (H) 70 - 99 mg/dL   BUN 16 6 - 20 mg/dL   Creatinine, Ser 4.03 0.61 - 1.24 mg/dL   Calcium 9.2 8.9 - 47.4 mg/dL   Total Protein 7.5 6.5 - 8.1 g/dL   Albumin 2.3 (L) 3.5 -  5.0 g/dL   AST 36 15 - 41 U/L   ALT 34 0 - 44 U/L   Alkaline Phosphatase 151 (H) 38 - 126 U/L   Total Bilirubin 0.6 0.3 - 1.2 mg/dL   GFR calc non Af Amer >60 >60 mL/min   GFR calc Af Amer >60 >60 mL/min   Anion gap 12 5 - 15    Comment: Performed at [facility], [address] [place], [place] [address]    Radiology/Results: No results found.  Anti-infectives: Anti-infectives (From admission, onward)   Start     Dose/Rate Route Frequency Ordered Stop   04/13/19 1400  meropenem (MERREM) 1 g in sodium chloride 0.9 % 100 mL IVPB  Status:  Discontinued     1 g 200 mL/hr over 30 Minutes Intravenous Every 8 hours 04/13/19 1117 04/16/19 0904   04/13/19 1300  ceFEPIme (MAXIPIME) 2 g in sodium chloride 0.9 % 100 mL IVPB     2 g 200 mL/hr over 30 Minutes Intravenous To Surgery 04/13/19 1256 04/13/19 1330   04/13/19 0600  vancomycin (VANCOCIN) 1,500 mg in sodium chloride 0.9 % 500 mL IVPB  Status:  Discontinued     1,500 mg 250 mL/hr over 120 Minutes Intravenous Every 12 hours 04/12/19 1343 04/13/19 1011   04/12/19 1430  vancomycin (VANCOCIN) 2,000 mg in sodium chloride 0.9 % 500 mL IVPB     2,000 mg 250 mL/hr over 120 Minutes Intravenous  Once 04/12/19 1343 04/12/19 1658   04/08/19 1200  ceFEPIme (MAXIPIME) 2 g in sodium chloride 0.9 % 100 mL IVPB  Status:  Discontinued     2 g 200 mL/hr over 30 Minutes Intravenous Every 8 hours  04/08/19 1009 04/13/19 1117   04/02/19 1200  ceFAZolin (ANCEF) IVPB 1 g/50 mL premix  Status:  Discontinued     1 g 100 mL/hr over 30 Minutes Intravenous Every 8 hours 04/02/19 1044 04/08/19 1007   03/26/19 1045  ceFAZolin (ANCEF) IVPB 2g/100 mL premix     2 g 200 mL/hr over 30 Minutes Intravenous On call to O.R. 03/25/19 2023 03/26/19 1535   03/24/19 2200  ceFEPIme (MAXIPIME) 2 g in sodium chloride 0.9 % 100 mL IVPB  Status:  Discontinued     2 g 200 mL/hr over 30 Minutes Intravenous Every 8 hours 03/24/19 1243 03/28/19 1009   03/19/19 1000  ceFEPIme (MAXIPIME) 2 g in sodium chloride 0.9 % 100 mL IVPB  Status:  Discontinued     2 g 200 mL/hr over 30 Minutes Intravenous Every 12 hours 03/19/19 0829 03/24/19 1243   03/10/19 1030  acyclovir (ZOVIRAX) 200 MG/5ML suspension SUSP 800 mg  Status:  Discontinued     800 mg Per Tube 5 times daily 03/10/19 1025 03/10/19 1043   03/01/19 1700  piperacillin-tazobactam (ZOSYN) IVPB 3.375 g  Status:  Discontinued     3.375 g 12.5 mL/hr over 240 Minutes Intravenous Every 8 hours 03/01/19 1615 03/05/19 1337      Assessment/Plan: Problem List: Patient Active Problem List   Diagnosis Date Noted  . Osteomyelitis of right tibia (HCC) 04/11/2019  . Open left tibial fracture 03/02/2019  . Unspecified injury of popliteal artery, right leg, initial encounter 03/02/2019  . Popliteal vein injury, right, initial encounter 03/02/2019  . 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 Pelvic ring fracture (HCC), Right  03/02/2019  . Left scapula fracture 03/02/2019  . Right knee dislocation 03/02/2019  . Femur fracture, left (HCC) 02/28/2019  Slow progress.  He asked about a subcut nodule on the left temple area.  This is likely a sebacous cyst that is visible now that he has lost 40 lbs.  Manage later as outpt.   5 Days Post-Op    LOS: 49 days   Matt B. Daphine Deutscher, MD, [facility] [phone] beeper [phone]  04/18/2019  9:19 AM

## 2019-04-18 NOTE — Progress Notes (Signed)
Assisted tele visit to patient with wife Obrien Huskins RN 

## 2019-04-19 ENCOUNTER — Encounter (HOSPITAL_COMMUNITY): Payer: Self-pay | Admitting: Orthopedic Surgery

## 2019-04-19 LAB — CULTURE, BLOOD (ROUTINE X 2)
Culture: NO GROWTH
Culture: NO GROWTH
Special Requests: ADEQUATE
Special Requests: ADEQUATE
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
## 2019-04-19 NOTE — Progress Notes (Signed)
Assisted tele visit to patient with wife.  Kelsi Benham P, RN  
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
## 2019-04-21 LAB — HCV RNA QUANT RFLX ULTRA OR GENOTYP
HCV RNA Qnt(log copy/mL): 6.25 log10 IU/mL
HepC Qn: 1780000 IU/mL

## 2019-04-21 LAB — HEPATITIS C GENOTYPE
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
## 2019-04-22 DIAGNOSIS — Z89611 Acquired absence of right leg above knee: Secondary | ICD-10-CM

## 2019-04-22 DIAGNOSIS — Z8781 Personal history of (healed) traumatic fracture: Secondary | ICD-10-CM

## 2019-04-22 DIAGNOSIS — B182 Chronic viral hepatitis C: Secondary | ICD-10-CM

## 2019-04-22 DIAGNOSIS — Z87820 Personal history of traumatic brain injury: Secondary | ICD-10-CM
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
## 2019-04-22 NOTE — Progress Notes (Signed)
Orthopedic Tech Progress Note Patient Details:  Travis Benson 1961/05/14 [id number]  Patient ID: [name], male   DOB: 1961-06-28, 58 y.o.   MRN: [id number] Applied [facility]  Trinna Post 04/22/2019, 5:20 PM
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
## 2019-04-22 NOTE — Progress Notes (Signed)
Inpatient Rehabilitation Admissions Coordinator  I met with Travis Lambert, RN CM, patient and his wife at bedside. I discussed goals and expectation of an inpt rehab admission of 2 to 3 weeks with caregiver support needed 24/7 after d/c. I also discussed the need for a first floor apartment for they now reside in a second floor apartment. Wife state she can arrange 24/7 assist when he comes home and they she will work on a first floor apartment. If this can be arranged, I will then discuss with our rehab MD and would possibly have a bed available on CIR next week. I will await wife to contact [name] to confirm first floor apartment availability in the next 3 to 4 weeks.  Danne Baxter, RN, MSN Rehab Admissions Coordinator [phone] 04/22/2019 2:36 PM
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
## 2019-04-23 NOTE — Progress Notes (Signed)
SLP Cancellation Note  Patient Details Name: Travis Benson MRN: [id number] DOB: 1961/02/06   Cancelled treatment:       Reason Eval/Treat Not Completed: Patient at procedure or test/unavailable . Pt was approached for cognitive-linguistic tx but was consuming dinner. SLP will follow up on a subsequent date.   Lyla Jasek I. Vear Clock, MS, CCC-SLP Acute Rehabilitation Services Office number [phone] Pager [phone]  [name] 04/23/2019, 5:30 PM

## 2019-04-23 NOTE — Plan of Care (Signed)
  Problem: Education: Goal: Knowledge of the prescribed therapeutic regimen will improve Outcome: Progressing Goal: Ability to verbalize activity precautions or restrictions will improve Outcome: Progressing Goal: Understanding of discharge needs will improve Outcome: Progressing   Problem: Activity: Goal: Ability to perform//tolerate increased activity and mobilize with assistive devices will improve Outcome: Progressing   Problem: Clinical Measurements: Goal: Postoperative complications will be avoided or minimized Outcome: Progressing   Problem: Self-Concept: Goal: Ability to maintain and perform role responsibilities to the fullest extent possible will improve Outcome: Progressing   Problem: Pain Management: Goal: Pain level will decrease with appropriate interventions Outcome: Progressing   Problem: Skin Integrity: Goal: Demonstration of wound healing without infection will improve Outcome: Progressing
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
## 2019-04-25 NOTE — Plan of Care (Signed)
  Problem: Education: Goal: Knowledge of the prescribed therapeutic regimen will improve Outcome: Progressing   Problem: Education: Goal: Ability to verbalize activity precautions or restrictions will improve Outcome: Progressing   Problem: Education: Goal: Understanding of discharge needs will improve Outcome: Progressing   Problem: Activity: Goal: Ability to perform//tolerate increased activity and mobilize with assistive devices will improve Outcome: Progressing   Problem: Clinical Measurements: Goal: Postoperative complications will be avoided or minimized Outcome: Progressing   Problem: Self-Concept: Goal: Ability to maintain and perform role responsibilities to the fullest extent possible will improve Outcome: Progressing   Problem: Pain Management: Goal: Pain level will decrease with appropriate interventions Outcome: Progressing
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
## 2019-04-26 MED ORDER — PRO-STAT SUGAR FREE PO LIQD
30.0000 mL | Freq: Two times a day (BID) | ORAL | Status: DC
  Administered 2019-04-26 – 2019-04-28 (×4): 30 mL via ORAL
  Filled 2019-04-26 (×3): qty 30
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
## 2019-04-27 ENCOUNTER — Other Ambulatory Visit: Payer: Self-pay

## 2019-04-27 DIAGNOSIS — S85501A Unspecified injury of popliteal vein, right leg, initial encounter: Secondary | ICD-10-CM

## 2019-04-27 NOTE — Progress Notes (Signed)
Inpatient Rehabilitation Admissions Coordinator  I met with patient at bedside as he conference with his wife by phone. She states that she can not financially afford to move to ground level apartment and that she has been there for 7 years. It would also be a smaller apartment and that she was not willing to move at this time. I told her that I did not feel it was realistic to d/c him home with 13 step to enter into apartment due to safety concerns. I discussed with RN CM and SW. I will follow up with rehab MD and follow up tomorrow.  Danne Baxter, RN, MSN Rehab Admissions Coordinator [phone] 04/27/2019 11:57 AM

## 2019-04-27 NOTE — Progress Notes (Signed)
Spoke with Brayton Caves with LCSW team and to clarify, Infectious Disease will take care of determining which Hep C medication will be the best to treat Mr. Cogburn chronic hepatitis c infection in the outpatient setting with re-evaluation in upcoming visit in August 2020.   No further interventions needed at present.   Rexene Alberts, MSN, NP-C [facility] for Infectious Disease [facility]  [email or web address] Pager: [phone] Office: [phone] [facility] Main Line: [phone]
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
## 2019-04-27 NOTE — TOC Transition Note (Signed)
Transition of Care [facility]) - CM/SW Discharge Note   Patient Details  Name: Travis Benson MRN: [id number] Date of Birth: [birth year]  [facility] CM/SW Contact:  Legrand Como, LCSW Phone Number: [phone] 04/27/2019, 2:45 PM   Clinical Narrative:     Clinical Social Worker continuing to follow patient and family for support and discharge planning needs.  CSW and RNCM spoke with inpatient rehab admissions coordinator who states that following conversation with family they will not be moving to a first floor apartment.  CSW will continue to pursue LOG SNF as local as possible.  [name] has agreed to evaluate patient information to determine if offer can be made.  CSW has not yet shared that information with patient and/or family until definitive offer can be made.  CSW remains available for support and to facilitate patient discharge needs once medically stable.   Final next level of care: Skilled Nursing Facility Barriers to Discharge: Continued Medical Work up   Patient Goals and CMS Choice        Discharge Placement                       Discharge Plan and Services In-house Referral: Clinical Social Work Discharge Planning Services: CM Consult                                 Social Determinants of Health (SDOH) Interventions     Readmission Risk Interventions No flowsheet data found.
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
## 2019-04-28 MED ORDER — OXYCODONE HCL 5 MG PO TABS: 5.0000 mg | ORAL_TABLET | Freq: Four times a day (QID) | ORAL | 0 refills | Status: DC | PRN

## 2019-04-28 MED ORDER — POLYETHYLENE GLYCOL 3350 17 G PO PACK: 17.0000 g | PACK | Freq: Every day | ORAL | 0 refills | Status: DC

## 2019-04-28 MED ORDER — ALBUTEROL SULFATE (2.5 MG/3ML) 0.083% IN NEBU: 2.5000 mg | INHALATION_SOLUTION | Freq: Four times a day (QID) | RESPIRATORY_TRACT | 12 refills | Status: DC | PRN

## 2019-04-28 MED ORDER — GABAPENTIN 300 MG PO CAPS: 300.0000 mg | ORAL_CAPSULE | Freq: Three times a day (TID) | ORAL | Status: DC

## 2019-04-28 MED ORDER — METOPROLOL TARTRATE 25 MG PO TABS: 25.0000 mg | ORAL_TABLET | Freq: Two times a day (BID) | ORAL | Status: DC

## 2019-04-28 MED ORDER — ACETAMINOPHEN 500 MG PO TABS: 500.0000 mg | ORAL_TABLET | Freq: Three times a day (TID) | ORAL | 0 refills | Status: DC | PRN

## 2019-04-28 MED ORDER — DOCUSATE SODIUM 100 MG PO CAPS: 100.0000 mg | ORAL_CAPSULE | Freq: Two times a day (BID) | ORAL | 0 refills | Status: DC

## 2019-04-28 MED ORDER — ASPIRIN 81 MG PO TBEC: 81.0000 mg | DELAYED_RELEASE_TABLET | Freq: Every day | ORAL | Status: AC

## 2019-04-28 MED ORDER — HYDROCORTISONE (PERIANAL) 2.5 % EX CREA: 1.0000 "application " | TOPICAL_CREAM | Freq: Four times a day (QID) | CUTANEOUS | 0 refills | Status: DC | PRN

## 2019-04-28 MED ORDER — ONDANSETRON 4 MG PO TBDP: 4.0000 mg | ORAL_TABLET | Freq: Four times a day (QID) | ORAL | 0 refills | Status: DC | PRN

## 2019-04-28 MED ORDER — METHOCARBAMOL 500 MG PO TABS: 1000.0000 mg | ORAL_TABLET | Freq: Four times a day (QID) | ORAL | Status: DC | PRN

## 2019-04-28 NOTE — TOC Transition Note (Signed)
Transition of Care [facility]) - CM/SW Discharge Note   Patient Details  Name: Travis Benson MRN: [id number] Date of Birth: [birth year]  Transition of [facility]) CM/SW Contact:  Legrand Como, LCSW Phone Number: [phone] 04/28/2019, 12:46 PM   Clinical Narrative:     Clinical Social Worker facilitated patient discharge including contacting patient family and facility to confirm patient discharge plans.  Clinical information faxed to facility and family agreeable with plan.  CSW arranged ambulance transport via [facility] to [facility].  RN to call report prior to discharge [phone]).  Clinical Social Worker will sign off for now as social work intervention is no longer needed. Please consult [place] again if new need arises.   Final next level of care: Skilled Nursing Facility Barriers to Discharge: No Barriers Identified   Patient Goals and CMS Choice Patient states their goals for this hospitalization and ongoing recovery are:: To get home to his family and gain his independence back CMS Medicare.gov Compare Post Acute Care list provided to:: Patient Choice offered to / list presented to : Patient  Discharge Placement PASRR number recieved: 04/28/19            Patient chooses bed at: [facility] Patient to be transferred to facility by: Ambulance Name of family member notified: Kornell Hor - patient spouse over the phone Patient and family notified of of transfer: 04/28/19  Discharge Plan and Services In-house Referral: Clinical Social Work Discharge Planning Services: CM Consult                                 Social Determinants of Health (SDOH) Interventions     Readmission Risk Interventions No flowsheet data found.

## 2019-04-28 NOTE — Progress Notes (Signed)
Report given to [place] at [facility]. Transport has taken patient on stretcher, belongings split between him and his wife who was met in the drive through. TSLO brace on for transport. IV discontinued. Pt in street clothes. Hilda Lias RN
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
## 2019-04-28 NOTE — Progress Notes (Signed)
Pt informed of transfer to SNF. Wife bringing clothes. All documents/transportation arranged via SW. Preparing for discharge and transport. RN reviewed AVS with patient and allowed time to ask questions prior to transport. Gabriel Cirri RN

## 2019-04-28 NOTE — NC FL2 (Signed)
[place] MEDICAID FL2 LEVEL OF CARE SCREENING TOOL     IDENTIFICATION  Patient Name: Travis Benson Birthdate: November 09, 1961 Sex: male Admission Date (Current Location): 02/28/2019  [facility] [place]   Facility and Address:  [facility] [place], [address], [place], [place] [address]      Provider Number: [id number]  Attending Physician Name and Address:  Md, Trauma, MD  Relative Name and Phone Number:       Current Level of Care: Hospital Recommended Level of Care: Skilled Nursing Facility Prior Approval Number:    Date Approved/Denied:   PASRR Number: [id number]  Discharge Plan: SNF    Current Diagnoses: Patient Active Problem List   Diagnosis Date Noted  . Chronic hepatitis C without hepatic coma (HCC) 04/22/2019  . Osteomyelitis of right tibia (HCC) 04/11/2019  . Open left tibial fracture 03/02/2019  . Unspecified injury of popliteal artery, right leg, initial encounter 03/02/2019  . Popliteal vein injury, right, initial encounter 03/02/2019  . 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 Pelvic ring fracture (HCC), Right  03/02/2019  . Left scapula fracture 03/02/2019  . Right knee dislocation 03/02/2019  . Femur fracture, left (HCC) 02/28/2019    Orientation RESPIRATION BLADDER Height & Weight     Self, Time, Place  Normal Incontinent Weight: 202 lb 9.6 oz (91.9 kg) Height:  6' (182.9 cm)  BEHAVIORAL SYMPTOMS/MOOD NEUROLOGICAL BOWEL NUTRITION STATUS      Incontinent (dysphagia 3 thin liquids; no pork)  AMBULATORY STATUS COMMUNICATION OF NEEDS Skin   Total Care Verbally Surgical wounds(Leg Incision)                       Personal Care Assistance Level of Assistance  Total care       Total Care Assistance: Maximum assistance   Functional Limitations Info             SPECIAL CARE FACTORS FREQUENCY  PT (By licensed PT), OT (By licensed OT), Speech therapy     PT Frequency: 5-6 times per week OT  Frequency: 5-6 times per week     Speech Therapy Frequency: 5-6 times per week      Contractures Contractures Info: Not present    Additional Factors Info  Code Status, Allergies Code Status Info: Full Code Allergies Info: No Known Allergies           Current Medications (04/28/2019):  This is the current hospital active medication list Current Facility-Administered Medications  Medication Dose Route Frequency Provider Last Rate Last Dose  . 0.9 %  sodium chloride infusion   Intravenous Continuous Meuth, Brooke A, PA-C 10 mL/hr at 04/14/19 [id number]    . 0.9 %  sodium chloride infusion   Intravenous PRN Montez Moritaaul, Keith, PA-C 10 mL/hr at 03/27/19 [id number]    . acetaminophen (TYLENOL) tablet 500 mg  500 mg Oral Q6H PRN Meuth, Brooke A, PA-C      . albuterol (PROVENTIL) (2.5 MG/3ML) 0.083% nebulizer solution 2.5 mg  2.5 mg Nebulization Q6H PRN Montez MoritaPaul, Keith, PA-C      . alum & mag hydroxide-simeth (MAALOX/MYLANTA) 200-200-20 MG/5ML suspension 30 mL  30 mL Oral Q6H PRN Montez MoritaPaul, Keith, PA-C   30 mL at 04/24/19 1845  . aspirin EC tablet 81 mg  81 mg Oral Daily Montez Moritaaul, Keith, PA-C   81 mg at 04/28/19 1005  . bisacodyl (DULCOLAX) suppository 10 mg  10 mg Rectal Q12H PRN Montez MoritaPaul, Keith, PA-C      .  docusate sodium (COLACE) capsule 100 mg  100 mg Oral BID Montez Morita, PA-C   100 mg at 04/28/19 1005  . enoxaparin (LOVENOX) injection 40 mg  40 mg Subcutaneous Q24H Montez Morita, PA-C   40 mg at 04/28/19 1006  . feeding supplement (ENSURE ENLIVE) (ENSURE ENLIVE) liquid 237 mL  237 mL Oral BID BM Montez Morita, PA-C   237 mL at 04/27/19 1414  . feeding supplement (PRO-STAT SUGAR FREE 64) liquid 30 mL  30 mL Oral BID Joaquim Nam, RPH   30 mL at 04/28/19 1005  . gabapentin (NEURONTIN) capsule 300 mg  300 mg Oral TID Montez Morita, PA-C   300 mg at 04/28/19 1005  . guaiFENesin-dextromethorphan (ROBITUSSIN DM) 100-10 MG/5ML syrup 10 mL  10 mL Oral Q4H PRN Montez Morita, PA-C      . hydrALAZINE (APRESOLINE) injection 5 mg  5 mg  Intravenous Q4H PRN Montez Morita, PA-C   5 mg at 03/12/19 1610  . hydrocortisone (ANUSOL-HC) 2.5 % rectal cream 1 application  1 application Topical QID PRN Montez Morita, PA-C      . hydrocortisone cream 1 % 1 application  1 application Topical TID PRN Montez Morita, PA-C      . labetalol (NORMODYNE,TRANDATE) injection 10 mg  10 mg Intravenous Q2H PRN Montez Morita, PA-C   10 mg at 04/03/19 2318  . lactated ringers infusion   Intravenous Continuous Montez Morita, PA-C 10 mL/hr at 04/09/19 1431    . lip balm (CARMEX) ointment 1 application  1 application Topical BID Montez Morita, PA-C   1 application at 04/27/19 2218  . LORazepam (ATIVAN) injection 1 mg  1 mg Intravenous Q4H PRN Montez Morita, PA-C   1 mg at 04/04/19 2231  . magic mouthwash  15 mL Oral QID PRN Montez Morita, PA-C      . menthol-cetylpyridinium (CEPACOL) lozenge 3 mg  1 lozenge Oral PRN Montez Morita, PA-C      . methocarbamol (ROBAXIN) tablet 1,000 mg  1,000 mg Oral Q6H PRN Montez Morita, PA-C   1,000 mg at 04/28/19 1000  . metoprolol tartrate (LOPRESSOR) injection 10 mg  10 mg Intravenous Q4H PRN Montez Morita, PA-C      . metoprolol tartrate (LOPRESSOR) tablet 25 mg  25 mg Oral BID Montez Morita, PA-C   25 mg at 04/28/19 1005  . morphine 2 MG/ML injection 1 mg  1 mg Intravenous Q4H PRN Meuth, Brooke A, PA-C   1 mg at 04/23/19 1310  . ondansetron (ZOFRAN-ODT) disintegrating tablet 4 mg  4 mg Oral Q6H PRN Montez Morita, PA-C       Or  . ondansetron [facility]) injection 4 mg  4 mg Intravenous Q6H PRN Montez Morita, PA-C   4 mg at 03/02/19 2320  . oxyCODONE (Oxy IR/ROXICODONE) immediate release tablet 5-10 mg  5-10 mg Oral Q4H PRN Montez Morita, PA-C   10 mg at 04/28/19 1000  . phenol (CHLORASEPTIC) mouth spray 1-2 spray  1-2 spray Mouth/Throat PRN Montez Morita, PA-C      . polyethylene glycol (MIRALAX / GLYCOLAX) packet 17 g  17 g Oral BID Montez Morita, PA-C   17 g at 04/28/19 1006  . polyethylene glycol (MIRALAX / GLYCOLAX) packet 34 g  34 g Oral Q12H PRN Montez Morita, PA-C      . prochlorperazine (COMPAZINE) injection 5-10 mg  5-10 mg Intravenous Q4H PRN Montez Morita, PA-C      . Resource ThickenUp Clear   Oral PRN Montez Morita, PA-C      .  sodium chloride flush (NS) 0.9 % injection 10-40 mL  10-40 mL Intracatheter Q12H Montez Morita, PA-C   10 mL at 04/28/19 1005     Discharge Medications: Please see discharge summary for a list of discharge medications.  Relevant Imaging Results:  Relevant Lab Results:   Additional Information SSN  [id number]    Macario Golds, [place] [phone]

## 2019-04-28 NOTE — TOC Transition Note (Signed)
Transition of Care [facility]) - CM/SW Discharge Note   Patient Details  Name: Travis Benson MRN: [id number] Date of Birth: [birth year]  [facility] CM/SW Contact:  Glennon Mac, RN Phone Number: 04/28/2019, 1:44 PM   Clinical Narrative:  Pt medically stable for discharge and bed available today at SNF, per CSW arrangements.       Final next level of care: Skilled Nursing Facility Barriers to Discharge: No Barriers Identified   Patient Goals and CMS Choice Patient states their goals for this hospitalization and ongoing recovery are:: To get home to his family and gain his independence back CMS Medicare.gov Compare Post Acute Care list provided to:: Patient Choice offered to / list presented to : Patient  Discharge Placement PASRR number recieved: 04/28/19            Patient chooses bed at: [facility] Patient to be transferred to facility by: Ambulance Name of family member notified: Venus Moriarity - patient spouse over the phone Patient and family notified of of transfer: 04/28/19  Discharge Plan and Services In-house Referral: Clinical Social Work Discharge Planning Services: CM Consult                                     Readmission Risk Interventions Readmission Risk Prevention Plan 04/28/2019  Transportation Screening Complete  PCP or Specialist Appt within 3-5 Days (No Data)  Not Complete comments Pt discharging to SNF  HRI or Home Care Consult Not Complete  HRI or Home Care Consult comments pt discharging to SNF  Social Work Consult for Recovery Care Planning/Counseling Not Complete  SW consult not completed comments Pt discharging to SNF  Palliative Care Screening Not Applicable  Medication Review (RN Care Manager) Complete   Quintella Baton, RN, BSN  Trauma/Neuro ICU Case Manager [phone]
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
## 2019-04-28 NOTE — Discharge Instructions (Signed)
Pt needs to wear TLSO when he is out of bed or head of bed is greater than 45 degrees. Don brace while in bed Patient is weightbearing as tolerated of Left leg   Sutured Wound Care of R leg wound  F/u with Dr. Carola Frost with orthopedics to manage care of incisions  Sutures are stitches that can be used to close wounds. Taking care of your wound properly can help to prevent pain and infection. It can also help your wound to heal more quickly. Follow instructions from your health care provider about how to care for your sutured wound. Supplies needed:  Soap and water.  A clean bandage (dressing), if needed.  Antibiotic ointment.  A clean towel. How to care for your sutured wound   Keep the wound completely dry for the first 24 hours, or for as long as directed by your health care provider. After 24-48 hours, you may shower or bathe as directed by your health care provider. Do not soak or submerge the wound in water until the sutures have been removed.  After the first 24 hours, clean the wound once a day, or as often as directed by your health care provider, using the following steps: ? Wash the wound with soap and water. ? Rinse the wound with water to remove all soap. ? Pat the wound dry with a clean towel. Do not rub the wound.  After cleaning the wound, apply a thin layer of antibiotic ointment as directed by your health care provider. This will prevent infection and keep the dressing from sticking to the wound.  Follow instructions from your health care provider about how to change your dressing: ? Wash your hands with soap and water. If soap and water are not available, use hand sanitizer. ? Change your dressing at least once a day, or as often as told by your health care provider. If your dressing gets wet or dirty, change it. ? Leave sutures and other skin closures, such as adhesive tape or skin glue, in place. These skin closures may need to stay in place for 2 weeks or longer. If  adhesive strip edges start to loosen and curl up, you may trim the loose edges. Do not remove adhesive strips completely unless your health care provider tells you to do that.  Check your wound every day for signs of infection. Watch for: ? Redness, swelling, or pain. ? Fluid or blood. ? Warmth. ? Pus or a bad smell.  Have the sutures removed as directed by your health care provider. Follow these instructions at home: Medicines  Take or apply over-the-counter and prescription medicines only as told by your health care provider.  If you were prescribed an antibiotic medicine or ointment, take or apply it as told by your health care provider. Do not stop using the antibiotic even if your condition improves. General instructions  To help reduce scarring after your wound heals, cover your wound with clothing or apply sunscreen of at least 30 SPF whenever you are outside.  Do not scratch or pick at your wound.  Avoid stretching your wound.  Raise (elevate) the injured area above the level of your heart while you are sitting or lying down, if possible.  Drink enough fluids to keep your urine clear or pale yellow.  Keep all follow-up visits as told by your health care provider. This is important. Contact a health care provider if:  You received a tetanus shot and you have swelling, severe pain, redness,  or bleeding at the injection site.  Your wound breaks open.  You have redness, swelling, or pain around your wound.  You have fluid or blood coming from your wound.  Your wound feels warm to the touch.  You have a fever.  You notice something coming out of your wound, such as wood or glass.  You have pain that does not get better with medicine.  The skin near your wound changes color.  You need to change your dressing very frequently due to a lot of fluid, blood, or pus draining from the wound.  You develop a new rash.  You develop numbness around the wound. Get help  right away if:  You develop severe swelling around your wound.  You have pus or a bad smell coming from your wound.  Your pain suddenly gets worse and is severe.  You develop painful lumps near your wound or anywhere on your body.  You have a red streak going away from your wound.  The wound is on your hand or foot and: ? You cannot properly move a finger or toe. ? Your fingers or toes look pale or bluish. ? You have numbness that is spreading down your hand, foot, fingers, or toes. Summary  Sutures are stitches that can be used to close wounds.  Taking care of your wound properly can help to prevent pain and infection.  Keep the wound completely dry for the first 24 hours, or for as long as directed by your health care provider. After 24-48 hours, you may shower or bathe as directed by your health care provider. This information is not intended to replace advice given to you by your health care provider. Make sure you discuss any questions you have with your health care provider. Document Released: 01/23/2005 Document Revised: 01/21/2017 Document Reviewed: 01/21/2017 [employer] Interactive Patient Education  2019 [employer]

## 2019-04-29 ENCOUNTER — Encounter (HOSPITAL_BASED_OUTPATIENT_CLINIC_OR_DEPARTMENT_OTHER): Payer: Self-pay | Admitting: Emergency Medicine

## 2019-04-30 DIAGNOSIS — J15211 Pneumonia due to Methicillin susceptible Staphylococcus aureus: Secondary | ICD-10-CM | POA: Diagnosis not present

## 2019-04-30 DIAGNOSIS — R5381 Other malaise: Secondary | ICD-10-CM | POA: Diagnosis not present

## 2019-04-30 DIAGNOSIS — T07XXXA Unspecified multiple injuries, initial encounter: Secondary | ICD-10-CM | POA: Diagnosis not present

## 2019-04-30 DIAGNOSIS — S23101S Dislocation of unspecified thoracic vertebra, sequela: Secondary | ICD-10-CM | POA: Diagnosis not present

## 2019-05-05 DIAGNOSIS — M6281 Muscle weakness (generalized): Secondary | ICD-10-CM | POA: Diagnosis not present

## 2019-05-05 DIAGNOSIS — Z89611 Acquired absence of right leg above knee: Secondary | ICD-10-CM | POA: Diagnosis not present

## 2019-05-06 DIAGNOSIS — R5381 Other malaise: Secondary | ICD-10-CM | POA: Diagnosis not present

## 2019-05-06 DIAGNOSIS — S23101S Dislocation of unspecified thoracic vertebra, sequela: Secondary | ICD-10-CM | POA: Diagnosis not present

## 2019-05-06 DIAGNOSIS — S33101S Dislocation of unspecified lumbar vertebra, sequela: Secondary | ICD-10-CM | POA: Diagnosis not present

## 2019-05-06 DIAGNOSIS — J15211 Pneumonia due to Methicillin susceptible Staphylococcus aureus: Secondary | ICD-10-CM | POA: Diagnosis not present

## 2019-05-10 DIAGNOSIS — J15211 Pneumonia due to Methicillin susceptible Staphylococcus aureus: Secondary | ICD-10-CM | POA: Diagnosis not present

## 2019-05-10 DIAGNOSIS — S23101S Dislocation of unspecified thoracic vertebra, sequela: Secondary | ICD-10-CM | POA: Diagnosis not present

## 2019-05-10 DIAGNOSIS — S33101S Dislocation of unspecified lumbar vertebra, sequela: Secondary | ICD-10-CM | POA: Diagnosis not present

## 2019-05-10 DIAGNOSIS — R5381 Other malaise: Secondary | ICD-10-CM | POA: Diagnosis not present

## 2019-05-12 ENCOUNTER — Telehealth (HOSPITAL_COMMUNITY): Payer: Self-pay

## 2019-05-12 DIAGNOSIS — S332XXD Dislocation of sacroiliac and sacrococcygeal joint, subsequent encounter: Secondary | ICD-10-CM | POA: Diagnosis not present

## 2019-05-12 DIAGNOSIS — M75102 Unspecified rotator cuff tear or rupture of left shoulder, not specified as traumatic: Secondary | ICD-10-CM | POA: Diagnosis not present

## 2019-05-12 DIAGNOSIS — S83104D Unspecified dislocation of right knee, subsequent encounter: Secondary | ICD-10-CM | POA: Diagnosis not present

## 2019-05-12 DIAGNOSIS — S82202F Unspecified fracture of shaft of left tibia, subsequent encounter for open fracture type IIIA, IIIB, or IIIC with routine healing: Secondary | ICD-10-CM | POA: Diagnosis not present

## 2019-05-12 DIAGNOSIS — S72302D Unspecified fracture of shaft of left femur, subsequent encounter for closed fracture with routine healing: Secondary | ICD-10-CM | POA: Diagnosis not present

## 2019-05-12 NOTE — Telephone Encounter (Signed)
   Left voicemail for patient re: tomorrow's appointment.       

## 2019-05-13 ENCOUNTER — Ambulatory Visit: Payer: Self-pay | Admitting: Family

## 2019-05-13 ENCOUNTER — Other Ambulatory Visit (HOSPITAL_COMMUNITY): Payer: Self-pay

## 2019-05-13 ENCOUNTER — Encounter (HOSPITAL_COMMUNITY): Payer: Self-pay

## 2019-05-14 ENCOUNTER — Other Ambulatory Visit (HOSPITAL_COMMUNITY): Payer: Self-pay

## 2019-05-14 ENCOUNTER — Encounter (HOSPITAL_COMMUNITY): Payer: Self-pay

## 2019-05-14 ENCOUNTER — Ambulatory Visit: Payer: Self-pay | Admitting: Family

## 2019-05-14 DIAGNOSIS — S23101S Dislocation of unspecified thoracic vertebra, sequela: Secondary | ICD-10-CM | POA: Diagnosis not present

## 2019-05-14 DIAGNOSIS — S33101S Dislocation of unspecified lumbar vertebra, sequela: Secondary | ICD-10-CM | POA: Diagnosis not present

## 2019-05-14 DIAGNOSIS — R5381 Other malaise: Secondary | ICD-10-CM | POA: Diagnosis not present

## 2019-05-14 DIAGNOSIS — T07XXXA Unspecified multiple injuries, initial encounter: Secondary | ICD-10-CM | POA: Diagnosis not present

## 2019-05-18 DIAGNOSIS — M47814 Spondylosis without myelopathy or radiculopathy, thoracic region: Secondary | ICD-10-CM | POA: Diagnosis not present

## 2019-05-21 DIAGNOSIS — R5381 Other malaise: Secondary | ICD-10-CM | POA: Diagnosis not present

## 2019-05-21 DIAGNOSIS — S23101S Dislocation of unspecified thoracic vertebra, sequela: Secondary | ICD-10-CM | POA: Diagnosis not present

## 2019-05-21 DIAGNOSIS — T07XXXA Unspecified multiple injuries, initial encounter: Secondary | ICD-10-CM | POA: Diagnosis not present

## 2019-05-21 DIAGNOSIS — S33101S Dislocation of unspecified lumbar vertebra, sequela: Secondary | ICD-10-CM | POA: Diagnosis not present

## 2019-05-26 DIAGNOSIS — S72302D Unspecified fracture of shaft of left femur, subsequent encounter for closed fracture with routine healing: Secondary | ICD-10-CM | POA: Diagnosis not present

## 2019-05-26 DIAGNOSIS — M75102 Unspecified rotator cuff tear or rupture of left shoulder, not specified as traumatic: Secondary | ICD-10-CM | POA: Diagnosis not present

## 2019-05-26 DIAGNOSIS — T79A21D Traumatic compartment syndrome of right lower extremity, subsequent encounter: Secondary | ICD-10-CM | POA: Diagnosis not present

## 2019-05-26 DIAGNOSIS — S82202F Unspecified fracture of shaft of left tibia, subsequent encounter for open fracture type IIIA, IIIB, or IIIC with routine healing: Secondary | ICD-10-CM | POA: Diagnosis not present

## 2019-05-26 DIAGNOSIS — M86161 Other acute osteomyelitis, right tibia and fibula: Secondary | ICD-10-CM | POA: Diagnosis not present

## 2019-05-26 DIAGNOSIS — S83104D Unspecified dislocation of right knee, subsequent encounter: Secondary | ICD-10-CM | POA: Diagnosis not present

## 2019-06-03 MED FILL — ALBUTEROL SUL 2.5 MG/3 ML S: (2.5 MG/3ML | 22 days supply | Qty: 90 | Fill #0

## 2019-06-03 MED FILL — ONDANSETRON HCL 4 MG TABLET: 4 | 7 days supply | Qty: 30 | Fill #0

## 2019-06-03 MED FILL — GABAPENTIN 300 MG CAPSULE: 300 | 80 days supply | Qty: 240 | Fill #0

## 2019-06-03 MED FILL — METOPROLOL TARTRATE 25 MG T: 25 | 30 days supply | Qty: 60 | Fill #0

## 2019-06-03 MED FILL — HYDROCORTISONE 2.5% CREAM: 2.5 | 10 days supply | Qty: 30 | Fill #0

## 2019-06-03 MED FILL — METHOCARBAMOL 500 MG TABS: 500 | 22 days supply | Qty: 180 | Fill #0

## 2019-06-03 MED FILL — MUPIROCIN 2% OINTMENT: 2 | 22 days supply | Qty: 22 | Fill #0

## 2019-06-03 MED FILL — POLYETHYLENE GLYCOL 3350 PO: 17 | 14 days supply | Qty: 238 | Fill #0

## 2019-06-16 DIAGNOSIS — S82202F Unspecified fracture of shaft of left tibia, subsequent encounter for open fracture type IIIA, IIIB, or IIIC with routine healing: Secondary | ICD-10-CM | POA: Diagnosis not present

## 2019-06-16 DIAGNOSIS — M75102 Unspecified rotator cuff tear or rupture of left shoulder, not specified as traumatic: Secondary | ICD-10-CM | POA: Diagnosis not present

## 2019-06-16 DIAGNOSIS — S72302D Unspecified fracture of shaft of left femur, subsequent encounter for closed fracture with routine healing: Secondary | ICD-10-CM | POA: Diagnosis not present

## 2019-06-16 DIAGNOSIS — S83104D Unspecified dislocation of right knee, subsequent encounter: Secondary | ICD-10-CM | POA: Diagnosis not present

## 2019-06-16 DIAGNOSIS — S332XXD Dislocation of sacroiliac and sacrococcygeal joint, subsequent encounter: Secondary | ICD-10-CM | POA: Diagnosis not present

## 2019-06-18 ENCOUNTER — Ambulatory Visit: Payer: Self-pay | Attending: Family Medicine | Admitting: Family Medicine

## 2019-06-18 ENCOUNTER — Encounter: Payer: Self-pay | Admitting: Family Medicine

## 2019-06-18 ENCOUNTER — Other Ambulatory Visit: Payer: Self-pay

## 2019-06-18 VITALS — BP 128/90 | HR 81 | Temp 98.2°F | Ht 72.0 in

## 2019-06-18 DIAGNOSIS — B182 Chronic viral hepatitis C: Secondary | ICD-10-CM

## 2019-06-18 DIAGNOSIS — Z89611 Acquired absence of right leg above knee: Secondary | ICD-10-CM

## 2019-06-18 DIAGNOSIS — M792 Neuralgia and neuritis, unspecified: Secondary | ICD-10-CM

## 2019-06-18 DIAGNOSIS — D649 Anemia, unspecified: Secondary | ICD-10-CM

## 2019-06-18 DIAGNOSIS — R413 Other amnesia: Secondary | ICD-10-CM

## 2019-06-18 DIAGNOSIS — I10 Essential (primary) hypertension: Secondary | ICD-10-CM

## 2019-06-18 MED ORDER — AMLODIPINE BESYLATE 5 MG PO TABS: 5.0000 mg | ORAL_TABLET | Freq: Every day | ORAL | 5 refills | Status: DC

## 2019-06-18 MED FILL — ?AMLODIPINE BESYLATE 5MG TA: 5 | 30 days supply | Qty: 30 | Fill #0

## 2019-06-18 NOTE — Progress Notes (Signed)
To est care,
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
## 2019-06-19 LAB — CBC WITH DIFFERENTIAL/PLATELET
Basophils Absolute: 0 x10E3/uL (ref 0.0–0.2)
Basos: 1 %
EOS (ABSOLUTE): 0.1 x10E3/uL (ref 0.0–0.4)
Eos: 2 %
Hematocrit: 43.4 % (ref 37.5–51.0)
Hemoglobin: 14.8 g/dL (ref 13.0–17.7)
Immature Grans (Abs): 0 x10E3/uL (ref 0.0–0.1)
Immature Granulocytes: 0 %
Lymphocytes Absolute: 1.5 x10E3/uL (ref 0.7–3.1)
Lymphs: 33 %
MCH: 29.2 pg (ref 26.6–33.0)
MCHC: 34.1 g/dL (ref 31.5–35.7)
MCV: 86 fL (ref 79–97)
Monocytes Absolute: 0.5 x10E3/uL (ref 0.1–0.9)
Monocytes: 11 %
Neutrophils Absolute: 2.4 x10E3/uL (ref 1.4–7.0)
Neutrophils: 53 %
Platelets: 550 x10E3/uL — ABNORMAL HIGH (ref 150–450)
RBC: 5.06 x10E6/uL (ref 4.14–5.80)
RDW: 12.9 % (ref 11.6–15.4)
WBC: 4.5 x10E3/uL (ref 3.4–10.8)

## 2019-06-19 LAB — COMPREHENSIVE METABOLIC PANEL WITH GFR
ALT: 49 IU/L — ABNORMAL HIGH (ref 0–44)
AST: 42 IU/L — ABNORMAL HIGH (ref 0–40)
Albumin/Globulin Ratio: 0.9 — ABNORMAL LOW (ref 1.2–2.2)
Albumin: 3.9 g/dL (ref 3.8–4.9)
Alkaline Phosphatase: 158 IU/L — ABNORMAL HIGH (ref 39–117)
BUN/Creatinine Ratio: 11 (ref 9–20)
BUN: 10 mg/dL (ref 6–24)
Bilirubin Total: 0.4 mg/dL (ref 0.0–1.2)
CO2: 24 mmol/L (ref 20–29)
Calcium: 10.8 mg/dL — ABNORMAL HIGH (ref 8.7–10.2)
Chloride: 100 mmol/L (ref 96–106)
Creatinine, Ser: 0.93 mg/dL (ref 0.76–1.27)
GFR calc Af Amer: 104 mL/min/1.73
GFR calc non Af Amer: 90 mL/min/1.73
Globulin, Total: 4.3 g/dL (ref 1.5–4.5)
Glucose: 96 mg/dL (ref 65–99)
Potassium: 4.7 mmol/L (ref 3.5–5.2)
Sodium: 140 mmol/L (ref 134–144)
Total Protein: 8.2 g/dL (ref 6.0–8.5)

## 2019-06-21 DIAGNOSIS — S22060D Wedge compression fracture of T7-T8 vertebra, subsequent encounter for fracture with routine healing: Secondary | ICD-10-CM | POA: Diagnosis not present

## 2019-06-21 DIAGNOSIS — M542 Cervicalgia: Secondary | ICD-10-CM | POA: Diagnosis not present

## 2019-06-21 DIAGNOSIS — S065X9A Traumatic subdural hemorrhage with loss of consciousness of unspecified duration, initial encounter: Secondary | ICD-10-CM | POA: Diagnosis not present

## 2019-06-23 ENCOUNTER — Other Ambulatory Visit: Payer: Self-pay | Admitting: Neurosurgery

## 2019-06-23 ENCOUNTER — Other Ambulatory Visit (HOSPITAL_COMMUNITY): Payer: Self-pay | Admitting: Neurosurgery

## 2019-06-23 DIAGNOSIS — S065XAA Traumatic subdural hemorrhage with loss of consciousness status unknown, initial encounter: Secondary | ICD-10-CM

## 2019-06-23 DIAGNOSIS — S065X9A Traumatic subdural hemorrhage with loss of consciousness of unspecified duration, initial encounter: Secondary | ICD-10-CM

## 2019-06-23 DIAGNOSIS — M542 Cervicalgia: Secondary | ICD-10-CM

## 2019-06-24 ENCOUNTER — Telehealth: Payer: Self-pay | Admitting: Emergency Medicine

## 2019-06-24 NOTE — Telephone Encounter (Signed)
Nurse called the patient's home phone number but received no answer and message was left on the voicemail for the patient to call back.  Return phone number given. 

## 2019-07-06 ENCOUNTER — Other Ambulatory Visit: Payer: Self-pay

## 2019-07-06 ENCOUNTER — Ambulatory Visit: Payer: Medicaid Other | Attending: Family Medicine | Admitting: Physical Therapy

## 2019-07-06 ENCOUNTER — Encounter: Payer: Self-pay | Admitting: Physical Therapy

## 2019-07-06 DIAGNOSIS — M25612 Stiffness of left shoulder, not elsewhere classified: Secondary | ICD-10-CM | POA: Diagnosis not present

## 2019-07-06 DIAGNOSIS — M25651 Stiffness of right hip, not elsewhere classified: Secondary | ICD-10-CM | POA: Insufficient documentation

## 2019-07-06 DIAGNOSIS — R293 Abnormal posture: Secondary | ICD-10-CM | POA: Diagnosis not present

## 2019-07-06 DIAGNOSIS — R2689 Other abnormalities of gait and mobility: Secondary | ICD-10-CM | POA: Insufficient documentation

## 2019-07-06 DIAGNOSIS — M25662 Stiffness of left knee, not elsewhere classified: Secondary | ICD-10-CM | POA: Insufficient documentation

## 2019-07-06 DIAGNOSIS — M6249 Contracture of muscle, multiple sites: Secondary | ICD-10-CM | POA: Insufficient documentation

## 2019-07-06 DIAGNOSIS — M6281 Muscle weakness (generalized): Secondary | ICD-10-CM | POA: Insufficient documentation

## 2019-07-06 DIAGNOSIS — R2681 Unsteadiness on feet: Secondary | ICD-10-CM | POA: Diagnosis not present

## 2019-07-06 DIAGNOSIS — M25672 Stiffness of left ankle, not elsewhere classified: Secondary | ICD-10-CM | POA: Insufficient documentation

## 2019-07-06 DIAGNOSIS — M25652 Stiffness of left hip, not elsewhere classified: Secondary | ICD-10-CM | POA: Diagnosis not present

## 2019-07-06 DIAGNOSIS — R29898 Other symptoms and signs involving the musculoskeletal system: Secondary | ICD-10-CM | POA: Diagnosis not present
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
## 2019-07-13 ENCOUNTER — Ambulatory Visit: Payer: Medicaid Other | Admitting: Physical Therapy

## 2019-07-14 ENCOUNTER — Ambulatory Visit: Payer: Medicaid Other | Admitting: Physical Therapy

## 2019-07-14 ENCOUNTER — Encounter: Payer: Self-pay | Admitting: Physical Therapy

## 2019-07-14 ENCOUNTER — Other Ambulatory Visit: Payer: Self-pay

## 2019-07-14 DIAGNOSIS — M6281 Muscle weakness (generalized): Secondary | ICD-10-CM | POA: Diagnosis not present

## 2019-07-14 DIAGNOSIS — M25662 Stiffness of left knee, not elsewhere classified: Secondary | ICD-10-CM | POA: Diagnosis not present

## 2019-07-14 DIAGNOSIS — M25612 Stiffness of left shoulder, not elsewhere classified: Secondary | ICD-10-CM | POA: Diagnosis not present

## 2019-07-14 DIAGNOSIS — R293 Abnormal posture: Secondary | ICD-10-CM | POA: Diagnosis not present

## 2019-07-14 DIAGNOSIS — R29898 Other symptoms and signs involving the musculoskeletal system: Secondary | ICD-10-CM | POA: Diagnosis not present

## 2019-07-14 DIAGNOSIS — R2689 Other abnormalities of gait and mobility: Secondary | ICD-10-CM

## 2019-07-14 DIAGNOSIS — M25672 Stiffness of left ankle, not elsewhere classified: Secondary | ICD-10-CM | POA: Diagnosis not present

## 2019-07-14 DIAGNOSIS — M25652 Stiffness of left hip, not elsewhere classified: Secondary | ICD-10-CM | POA: Diagnosis not present

## 2019-07-14 DIAGNOSIS — R2681 Unsteadiness on feet: Secondary | ICD-10-CM

## 2019-07-14 DIAGNOSIS — M25651 Stiffness of right hip, not elsewhere classified: Secondary | ICD-10-CM | POA: Diagnosis not present

## 2019-07-14 DIAGNOSIS — M6249 Contracture of muscle, multiple sites: Secondary | ICD-10-CM | POA: Diagnosis not present

## 2019-07-14 NOTE — Therapy (Signed)
[place] [facility] [address] [place], [place], [address] Phone: [phone]   Fax:  [phone]  Physical Therapy Treatment  Patient Details  Name: Travis Benson MRN: [id number] Date of Birth: November 01, 1961 Referring Provider (PT): Antony Blackbird, MD  CLINIC OPERATION CHANGES: [facility] is open at lower capacity following universal masking, social distancing, and patient screening.  The patient's COVID risk of complications score is 2.  Encounter Date: 07/14/2019  PT End of Session - 07/14/19 1709    Visit Number  2    Number of Visits  41   May change if he receives Medicaid as only 27 visits /year allowed   Date for PT Re-Evaluation  11/24/19    Authorization Type  Medicaid pending / Self -pay  Per PT recommendation applying for [facility] Financial Assistance Program    PT Start Time  [phone]    PT Stop Time  1459    PT Time Calculation (min)  54 min    Activity Tolerance  Patient tolerated treatment well;Patient limited by fatigue    Behavior During Therapy  [facility] for tasks assessed/performed       Past Medical History:  Diagnosis Date  . Coronary artery disease   . History of hepatitis C   . Hypertension   . 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 Left scapula fracture 03/02/2019  . Open left tibial fracture 03/02/2019  . Pelvic ring fracture ([place]), Right  03/02/2019  . Right knee dislocation 03/02/2019  . Unspecified injury of popliteal artery, right leg, initial encounter 03/02/2019    Past Surgical History:  Procedure Laterality Date  . AMPUTATION Right 04/13/2019   Procedure: AMPUTATION ABOVE KNEE;  Surgeon: Altamese Nokomis, MD;  Location: [place];  Service: Orthopedics;  Laterality: Right;  . APPLICATION OF WOUND VAC Bilateral 03/05/2019   Procedure: WOUND VAC CHANGE RIGHT LOWER LEG; REMOVAL OF WOUND VAC LEFT LOWER LEG WITH APPLICATION OF DRESSINGS;  Surgeon: Rosetta Posner, MD;  Location: [place];  Service:  Vascular;  Laterality: Bilateral;  . APPLICATION OF WOUND VAC Right 04/09/2019   Procedure: APPLICATION OF WOUND VAC;  Surgeon: Altamese Raytown, MD;  Location: [place];  Service: Orthopedics;  Laterality: Right;  . APPLICATION OF WOUND VAC Bilateral 03/02/2019   Procedure: Application Of Wound Vac;  Surgeon: Altamese Timpson, MD;  Location: [place];  Service: Orthopedics;  Laterality: Bilateral;  . APPLICATION OF WOUND VAC Right 03/02/2019   Procedure: Wound Vac dressing removal;  Surgeon: Waynetta Sandy, MD;  Location: [place];  Service: Vascular;  Laterality: Right;  . EXTERNAL FIXATION LEG Right 03/02/2019   Procedure: External Fixation Leg;  Surgeon: Altamese Prosser, MD;  Location: [place];  Service: Orthopedics;  Laterality: Right;  . EXTERNAL FIXATION REMOVAL Left 03/02/2019   Procedure: REMOVAL EXTERNAL FIXATION LEG;  Surgeon: Altamese [place], MD;  Location: [facility];  Service: Orthopedics;  Laterality: Left;  . FASCIOTOMY Right 02/28/2019   Procedure: FOUR COMPARTMENT FASCIOTOMY OF RIGHT LOWER LEG;  Surgeon: Waynetta Sandy, MD;  Location: [facility];  Service: Vascular;  Laterality: Right;  . FEMORAL-POPLITEAL BYPASS GRAFT Right 02/28/2019   Procedure: BYPASS GRAFT OF ABOVE KNEE POPLITEAL- BELOW KNEE POPLITEAL ARTERY;  Surgeon: Waynetta Sandy, MD;  Location: [facility];  Service: Vascular;  Laterality: Right;  . FEMUR IM NAIL Left 03/02/2019   Procedure: INTRAMEDULLARY (IM) RETROGRADE FEMORAL NAILING;  Surgeon: Altamese , MD;  Location: [place];  Service: Orthopedics;  Laterality: Left;  .  I&D EXTREMITY Left 02/28/2019   Procedure: IRRIGATION AND DEBRIDEMENT OF LEFT LOWER LEG /INTERNAL FIXATION OF LEFT TIBIA PLACEMENT OF FRACTURE PINLEFT FEMUR/ CLOSED REDUCTION OF LEFT FEMUR.;  Surgeon: Durene Romanslin, Matthew, MD;  [facility];  Service: Orthopedics;  Laterality: Left;  . I&D EXTREMITY Right 04/09/2019   Procedure: IRRIGATION AND DEBRIDEMENT EXTREMITY R lower leg;  Surgeon: Myrene GalasHandy, Michael, MD;   [facility];  Service: Orthopedics;  Laterality: Right;  . I&D EXTREMITY Right 04/13/2019   Procedure: IRRIGATION AND DEBRIDEMENT EXTREMITY Right Leg;  Surgeon: Myrene GalasHandy, Michael, MD;  [facility];  Service: Orthopedics;  Laterality: Right;  . I&D EXTREMITY Left 03/02/2019   Procedure: IRRIGATION AND DEBRIDEMENT LEG;  Surgeon: Myrene GalasHandy, Michael, MD;  [facility];  Service: Orthopedics;  Laterality: Left;  . ORIF PELVIC FRACTURE WITH PERCUTANEOUS SCREWS Right 03/02/2019   Procedure: Orif Pelvic Fracture With Percutaneous Screws;  Surgeon: Myrene GalasHandy, Michael, MD;  [facility];  Service: Orthopedics;  Laterality: Right;  . SKIN SPLIT GRAFT Right 03/26/2019   Procedure: SKIN GRAFT SPLIT THICKNESS;  Surgeon: Myrene GalasHandy, Michael, MD;  [facility];  Service: Orthopedics;  Laterality: Right;  . TIBIA IM NAIL INSERTION Left 03/02/2019   Procedure: INTRAMEDULLARY (IM) NAIL TIBIAL;  Surgeon: Myrene GalasHandy, Michael, MD;  [facility];  Service: Orthopedics;  Laterality: Left;    There were no vitals filed for this visit.  Subjective Assessment - 07/14/19 1701    Subjective  Denies any falls or changes since last visit.  Pt thought is appointment Tuesday was at a later time so missed his appointment.    Patient is accompained by:  Family member   wife, Daron OfferSusan Finazzo   Pertinent History  CAD, Hep C, HTN    Patient Stated Goals  To get stronger to get prosthesis, Then when get prosthesis, return to normalicy including work, move when wants, not be dependent on others.    Currently in Pain?  No/denies        [facility] - 07/14/19 0001      Transfers   Lateral/Scoot Transfers  5: Supervision;With armrests removed    Lateral/Scoot Transfer Details (indicate cue type and reason)  minimal clearance of buttocks    Comments  transfers from w/c<>mat at same level      Posture/Postural Control   Posture/Postural Control  Postural limitations    Postural Limitations  Rounded Shoulders;Forward  head;Flexed trunk;Weight shift left      Exercises   Exercises  Knee/Hip      Knee/Hip Exercises: Stretches   Hip Flexor Stretch  Left;2 reps;30 seconds;Other (comment)   off edge of mat with foot propped on block   Other Knee/Hip Stretches  Seated ankle/heel cord stretch on LLE with black resistance band x 1 minute hold x 2 sets      Knee/Hip Exercises: Supine   Quad Sets  1 set;10 reps;Left    Short Arc Quad Sets  1 set;10 reps;Left    Bridges  1 set;10 reps;Left    Other Supine Knee/Hip Exercises  See HEP for details of exercises performed.             PT Education - 07/14/19 1708    Education Details  HEP    Person(s) Educated  Patient;Spouse    Methods  Explanation;Demonstration;Handout    Comprehension  Verbalized understanding       PT Short Term Goals - 07/06/19 2259      PT SHORT TERM GOAL #1   Title  Patient demonstrates understanding of initial HEP. (All STGs Target Date: 08/06/2019)    Time  4    Period  Weeks    Status  New    Target Date  08/06/19      PT SHORT TERM GOAL #2   Title  Patient sit to /from stand w/c tosink with minA    Time  1    Period  Months    Status  New    Target Date  08/06/19      PT SHORT TERM GOAL #3   Title  Patient tolerates standing statically with sink support & stool under right ischuim for 2 minutes with supervision.    Time  1    Period  Months    Status  New    Target Date  08/06/19      PT SHORT TERM GOAL #4   Title  PT to perform prosthetic assessment once he receives his prosthesis    Time  1    Period  Months    Status  New    Target Date  08/06/19        PT Long Term Goals - 07/06/19 2303      PT LONG TERM GOAL #1   Title  Patient demonstrates & verbalizes understanding of prosthetic care to enable safe utilization of prosthesis (All LTGs Target Date: 11/24/2019)    Time  20    Period  Weeks    Status  New    Target Date  11/24/19      PT LONG TERM GOAL #2   Title  Patient tolerates wear of  prosthesis >80% of awake hours without skin or limb pain issues to enable function throughout his day.    Time  20    Period  Weeks    Status  New    Target Date  11/24/19      PT LONG TERM GOAL #3   Title  Standing Balance with prosthesis Berg Balance >45/56 to indicate lower fall risk.    Time  20    Period  Weeks    Status  New    Target Date  11/24/19      PT LONG TERM GOAL #4   Title  Patient ambulates 300' with LRAD & prosthesis modified independent.    Time  20    Period  Weeks    Status  New    Target Date  11/24/19      PT LONG TERM GOAL #5   Title  Patient negotiates stairs, ramps & curbs with LRAD & prosthesis modified independent.    Time  20    Period  Weeks    Status  New    Target Date  11/24/19            Plan - 07/14/19 1709    Clinical Impression Statement  Initiated HEP for stretching and strengthening today.  Continue PT per POC.    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, T8, T12 & L3-5 fractures, Left femur fracture,  left scapula fx, open left tibia fx, pelvic ring fx, CAD, HTN, HepC    Examination-Activity Limitations  Bed Mobility;Caring for Others;Carry;Hygiene/Grooming;Lift;Locomotion Level;Reach Overhead;Squat;Stairs;Stand;Transfers    Examination-Participation Restrictions  Community Activity;Driving;Meal Prep;Volunteer;Yard Work;Other   work   Conservation officer, historic buildingstability/Clinical Decision Making  Unstable/Unpredictable    Rehab  Potential  Good    PT Frequency  2x / week    PT Duration  Other (comment)   20 weeks, but may change if patient gets Medicaid   PT Treatment/Interventions  ADLs/Self Care Home Management;Aquatic Therapy;Canalith Repostioning;Cryotherapy;Electrical Stimulation;Moist Heat;Ultrasound;DME Instruction;Gait training;Stair  training;Functional mobility training;Therapeutic activities;Therapeutic exercise;Balance training;Neuromuscular re-education;Patient/family education;Prosthetic Training;Manual techniques;Scar mobilization;Passive range of motion;Dry needling;Vestibular;Joint Manipulations    PT Next Visit Plan  Review HEP for ROM & strength  and revise/add to as needed.  Trial of standing at counter with stool under R ischium.    PT Home Exercise Plan  [id number]    Consulted and Agree with Plan of Care  Patient;Family member/caregiver    Family Member Consulted  wife, Daron OfferSusan Mckinny       Patient will benefit from skilled therapeutic intervention in order to improve the following deficits and impairments:  Abnormal gait, Decreased activity tolerance, Decreased balance, Decreased coordination, Decreased endurance, Decreased knowledge of use of DME, Decreased mobility, Decreased range of motion, Decreased skin integrity, Decreased strength, Difficulty walking, Impaired flexibility, Impaired UE functional use, Postural dysfunction, Prosthetic Dependency  Visit Diagnosis: 1. Muscle weakness (generalized)   2. Unsteadiness on feet   3. Contracture of muscle, multiple sites   4. Other abnormalities of gait and mobility   5. Abnormal posture        Problem List Patient Active Problem List   Diagnosis Date Noted  . Chronic hepatitis C without hepatic coma (HCC) 04/22/2019  . Osteomyelitis of right tibia (HCC) 04/11/2019  . Open left tibial fracture 03/02/2019  . Unspecified injury of popliteal artery, right leg, initial encounter 03/02/2019  . Popliteal vein injury, right, initial encounter 03/02/2019  . 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 Pelvic ring fracture (HCC), Right  03/02/2019  . Left scapula fracture 03/02/2019  . Right knee dislocation 03/02/2019  . Femur fracture, left (HCC) 02/28/2019    Newell Coralenise Terry Gara Kincade, PTA [facility] 07/14/19 5:13  PM Phone: [phone] Fax: [phone]   [facility] [address] [place], [place], [address] Phone: [phone]   Fax:  [phone]  Name: Travis Benson MRN: [id number] Date of Birth: Jun 20, 1961

## 2019-07-14 NOTE — Patient Instructions (Signed)
Access Code: [email or web address]  URL: [email or web address]  Date: 07/14/2019  Prepared by: Nita Sells   Exercises  Long Sitting Calf Stretch with Strap - 3 reps - 30 hold - 2x daily - 7x weekly  Sitting Knee Extension with Resistance - 10 reps - 2 sets - 2x daily - 7x weekly  Seated Hamstring Curl with Anchored Resistance - 10 reps - 1 sets - 2x daily - 7x weekly  Supine Hip Extension with Towel Roll (AKA) - 10 reps - 3 second hold - 2x daily - 7x weekly  Modified Thomas Stretch - 3 reps - 30 seconds hold - 2x daily - 7x weekly

## 2019-07-20 ENCOUNTER — Telehealth: Payer: Self-pay | Admitting: Emergency Medicine

## 2019-07-20 NOTE — Telephone Encounter (Signed)
Patient contacted via phone to be given results of labs.  Patient identified by name and date of birth.  Patient given results of labs.  Patient educated on lab results. Questions answered. Patient acknowledged understanding of labs results. 

## 2019-07-21 ENCOUNTER — Ambulatory Visit (HOSPITAL_COMMUNITY): Payer: Self-pay

## 2019-07-21 ENCOUNTER — Other Ambulatory Visit (HOSPITAL_COMMUNITY): Payer: Self-pay

## 2019-07-21 DIAGNOSIS — M75102 Unspecified rotator cuff tear or rupture of left shoulder, not specified as traumatic: Secondary | ICD-10-CM | POA: Diagnosis not present

## 2019-07-21 DIAGNOSIS — S82202F Unspecified fracture of shaft of left tibia, subsequent encounter for open fracture type IIIA, IIIB, or IIIC with routine healing: Secondary | ICD-10-CM | POA: Diagnosis not present

## 2019-07-21 DIAGNOSIS — S332XXD Dislocation of sacroiliac and sacrococcygeal joint, subsequent encounter: Secondary | ICD-10-CM | POA: Diagnosis not present

## 2019-07-21 DIAGNOSIS — S83104D Unspecified dislocation of right knee, subsequent encounter: Secondary | ICD-10-CM | POA: Diagnosis not present

## 2019-07-21 DIAGNOSIS — M86161 Other acute osteomyelitis, right tibia and fibula: Secondary | ICD-10-CM | POA: Diagnosis not present

## 2019-07-21 DIAGNOSIS — S72302D Unspecified fracture of shaft of left femur, subsequent encounter for closed fracture with routine healing: Secondary | ICD-10-CM | POA: Diagnosis not present

## 2019-07-21 DIAGNOSIS — T79A21D Traumatic compartment syndrome of right lower extremity, subsequent encounter: Secondary | ICD-10-CM | POA: Diagnosis not present

## 2019-07-22 ENCOUNTER — Ambulatory Visit: Payer: Medicaid Other | Admitting: Physical Therapy

## 2019-07-23 ENCOUNTER — Ambulatory Visit (HOSPITAL_COMMUNITY)
Admission: RE | Admit: 2019-07-23 | Discharge: 2019-07-23 | Disposition: A | Discharge status: Home / Self Care | Payer: Medicaid Other | Source: Ambulatory Visit | Attending: Neurosurgery | Admitting: Neurosurgery

## 2019-07-23 ENCOUNTER — Other Ambulatory Visit: Payer: Self-pay

## 2019-07-23 ENCOUNTER — Ambulatory Visit: Payer: Medicaid Other | Admitting: Physical Therapy

## 2019-07-23 DIAGNOSIS — M542 Cervicalgia: Secondary | ICD-10-CM | POA: Insufficient documentation

## 2019-07-23 DIAGNOSIS — M25651 Stiffness of right hip, not elsewhere classified: Secondary | ICD-10-CM | POA: Diagnosis not present

## 2019-07-23 DIAGNOSIS — M6249 Contracture of muscle, multiple sites: Secondary | ICD-10-CM

## 2019-07-23 DIAGNOSIS — M25612 Stiffness of left shoulder, not elsewhere classified: Secondary | ICD-10-CM | POA: Diagnosis not present

## 2019-07-23 DIAGNOSIS — R2681 Unsteadiness on feet: Secondary | ICD-10-CM

## 2019-07-23 DIAGNOSIS — M25672 Stiffness of left ankle, not elsewhere classified: Secondary | ICD-10-CM | POA: Diagnosis not present

## 2019-07-23 DIAGNOSIS — R293 Abnormal posture: Secondary | ICD-10-CM | POA: Diagnosis not present

## 2019-07-23 DIAGNOSIS — M25662 Stiffness of left knee, not elsewhere classified: Secondary | ICD-10-CM | POA: Diagnosis not present

## 2019-07-23 DIAGNOSIS — R2689 Other abnormalities of gait and mobility: Secondary | ICD-10-CM

## 2019-07-23 DIAGNOSIS — M25652 Stiffness of left hip, not elsewhere classified: Secondary | ICD-10-CM | POA: Diagnosis not present

## 2019-07-23 DIAGNOSIS — R29898 Other symptoms and signs involving the musculoskeletal system: Secondary | ICD-10-CM | POA: Diagnosis not present

## 2019-07-23 DIAGNOSIS — S065X9A Traumatic subdural hemorrhage with loss of consciousness of unspecified duration, initial encounter: Secondary | ICD-10-CM | POA: Insufficient documentation

## 2019-07-23 DIAGNOSIS — M6281 Muscle weakness (generalized): Secondary | ICD-10-CM

## 2019-07-23 DIAGNOSIS — S065XAA Traumatic subdural hemorrhage with loss of consciousness status unknown, initial encounter: Secondary | ICD-10-CM

## 2019-07-23 NOTE — Therapy (Signed)
[facility] [address] [place], [place], [address] Phone: [phone]   Fax:  [phone]  Physical Therapy Treatment  Patient Details  Name: Travis Benson MRN: [id number] Date of Birth: 1961-03-12 Referring Provider (PT): Cain Saupeammie Fulp, MD  CLINIC OPERATION CHANGES: Outpatient Neuro Rehab is open at lower capacity following universal masking, social distancing, and patient screening.  The patient's COVID risk of complications score is 2.  Encounter Date: 07/23/2019  PT End of Session - 07/23/19 1446    Visit Number  3    Number of Visits  41   May change if he receives Medicaid as only 27 visits /year allowed   Date for PT Re-Evaluation  11/24/19    Authorization Type  Medicaid pending / Self -pay  Per PT recommendation applying for [facility] Financial Assistance Program    PT Start Time  1404    PT Stop Time  1440   Pt had to leave early for MRI and CT scan   PT Time Calculation (min)  36 min    Equipment Utilized During Treatment  Gait belt    Activity Tolerance  Patient tolerated treatment well;Patient limited by fatigue    Behavior During Therapy  WFL for tasks assessed/performed       Past Medical History:  Diagnosis Date  . Coronary artery disease   . History of hepatitis C   . Hypertension   . 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 Left scapula fracture 03/02/2019  . Open left tibial fracture 03/02/2019  . Pelvic ring fracture (HCC), Right  03/02/2019  . Right knee dislocation 03/02/2019  . Unspecified injury of popliteal artery, right leg, initial encounter 03/02/2019    Past Surgical History:  Procedure Laterality Date  . AMPUTATION Right 04/13/2019   Procedure: AMPUTATION ABOVE KNEE;  Surgeon: Myrene GalasHandy, Michael, MD;  [facility];  Service: Orthopedics;  Laterality: Right;  . APPLICATION OF WOUND VAC Bilateral 03/05/2019   Procedure: WOUND VAC CHANGE RIGHT LOWER LEG; REMOVAL OF WOUND VAC LEFT LOWER LEG  WITH APPLICATION OF DRESSINGS;  Surgeon: Larina EarthlyEarly, Todd F, MD;  Location: [facility] OR;  Service: Vascular;  Laterality: Bilateral;  . APPLICATION OF WOUND VAC Right 04/09/2019   Procedure: APPLICATION OF WOUND VAC;  Surgeon: Myrene GalasHandy, Michael, MD;  Location: [facility] OR;  Service: Orthopedics;  Laterality: Right;  . APPLICATION OF WOUND VAC Bilateral 03/02/2019   Procedure: Application Of Wound Vac;  Surgeon: Myrene GalasHandy, Michael, MD;  [facility];  Service: Orthopedics;  Laterality: Bilateral;  . APPLICATION OF WOUND VAC Right 03/02/2019   Procedure: Wound Vac dressing removal;  Surgeon: Maeola Harmanain, Brandon Christopher, MD;  [facility];  Service: Vascular;  Laterality: Right;  . EXTERNAL FIXATION LEG Right 03/02/2019   Procedure: External Fixation Leg;  Surgeon: Myrene GalasHandy, Michael, MD;  [facility];  Service: Orthopedics;  Laterality: Right;  . EXTERNAL FIXATION REMOVAL Left 03/02/2019   Procedure: REMOVAL EXTERNAL FIXATION LEG;  Surgeon: Myrene GalasHandy, Michael, MD;  [facility];  Service: Orthopedics;  Laterality: Left;  . FASCIOTOMY Right 02/28/2019   Procedure: FOUR COMPARTMENT FASCIOTOMY OF RIGHT LOWER LEG;  Surgeon: Maeola Harmanain, Brandon Christopher, MD;  [facility];  Service: Vascular;  Laterality: Right;  . FEMORAL-POPLITEAL BYPASS GRAFT Right 02/28/2019   Procedure: BYPASS GRAFT OF ABOVE KNEE POPLITEAL- BELOW KNEE POPLITEAL ARTERY;  Surgeon: Maeola Harmanain, Brandon Christopher, MD;  [facility];  Service: Vascular;  Laterality: Right;  . FEMUR IM NAIL Left 03/02/2019   Procedure:  INTRAMEDULLARY (IM) RETROGRADE FEMORAL NAILING;  Surgeon: Myrene GalasHandy, Michael, MD;  [facility]: [facility] OR;  Service: Orthopedics;  Laterality: Left;  . I&D EXTREMITY Left 02/28/2019   Procedure: IRRIGATION AND DEBRIDEMENT OF LEFT LOWER LEG /INTERNAL FIXATION OF LEFT TIBIA PLACEMENT OF FRACTURE PINLEFT FEMUR/ CLOSED REDUCTION OF LEFT FEMUR.;  Surgeon: Durene Romanslin, Matthew, MD;  Location: [facility] OR;  Service: Orthopedics;  Laterality: Left;  . I&D EXTREMITY Right 04/09/2019   Procedure:  IRRIGATION AND DEBRIDEMENT EXTREMITY R lower leg;  Surgeon: Myrene GalasHandy, Michael, MD;  [facility]: [facility] OR;  Service: Orthopedics;  Laterality: Right;  . I&D EXTREMITY Right 04/13/2019   Procedure: IRRIGATION AND DEBRIDEMENT EXTREMITY Right Leg;  Surgeon: Myrene GalasHandy, Michael, MD;  [facility];  Service: Orthopedics;  Laterality: Right;  . I&D EXTREMITY Left 03/02/2019   Procedure: IRRIGATION AND DEBRIDEMENT LEG;  Surgeon: Myrene GalasHandy, Michael, MD;  [facility];  Service: Orthopedics;  Laterality: Left;  . ORIF PELVIC FRACTURE WITH PERCUTANEOUS SCREWS Right 03/02/2019   Procedure: Orif Pelvic Fracture With Percutaneous Screws;  Surgeon: Myrene GalasHandy, Michael, MD;  [facility]: [facility] OR;  Service: Orthopedics;  Laterality: Right;  . SKIN SPLIT GRAFT Right 03/26/2019   Procedure: SKIN GRAFT SPLIT THICKNESS;  Surgeon: Myrene GalasHandy, Michael, MD;  [facility];  Service: Orthopedics;  Laterality: Right;  . TIBIA IM NAIL INSERTION Left 03/02/2019   Procedure: INTRAMEDULLARY (IM) NAIL TIBIAL;  Surgeon: Myrene GalasHandy, Michael, MD;  [facility]: [facility] OR;  Service: Orthopedics;  Laterality: Left;    There were no vitals filed for this visit.  Subjective Assessment - 07/23/19 1409    Subjective  Pt saw Dr Carola FrostHandy who thinks pt does have L Rotator Cuff Tear.  Sending pt for a MRI of the shoulder (to be scheduled).  Pt having MRI of head and CT of cervical spine today.  Has been doing exercises and reports L leg sore.    Patient is accompained by:  Family member   wife, Daron OfferSusan Weidmann   Pertinent History  CAD, Hep C, HTN    Patient Stated Goals  To get stronger to get prosthesis, Then when get prosthesis, return to normalicy including work, move when wants, not be dependent on others.    Currently in Pain?  No/denies        [facility] Adult PT Treatment/Exercise - 07/23/19 0001      Bed Mobility   Bed Mobility  Sit to Supine;Supine to Sit    Supine to Sit  Supervision/Verbal cueing    Sit to Supine  Supervision/Verbal cueing      Transfers   Transfers  Sit  to Stand;Stand to Sit;Lateral/Scoot Transfers    Sit to Stand  2: Max assist;1: +2 Total assist;With upper extremity assist;From elevated surface;Other (comment)   from stool under r ischium at counter for UE support.     Sit to Stand Details  Manual facilitation for weight shifting;Manual facilitation for weight bearing;Verbal cues for technique;Verbal cues for precautions/safety    Sit to Stand Details (indicate cue type and reason)  Seated on stool under R ischium at counter and performing partial sit to standing at counter.  Pt requiring +2 assist to facilitate extension of L hip and knee and for safety.    Sit to Stand: Patient Percentage  60%    Stand to Sit  3: Mod assist;With upper extremity assist;To elevated surface;1: +2 Total assist    Stand to Sit: Patient Percentage  70%    Stand to Sit Details (indicate cue type and reason)  Manual facilitation for  weight shifting;Manual facilitation for weight bearing;Tactile cues for sequencing;Tactile cues for placement    Stand to Sit Details  onto elevated stool at counter    Lateral/Scoot Transfers  5: Supervision    Lateral/Scoot Transfer Details (indicate cue type and reason)  minimal clearance of buttocks    Number of Reps  Other reps (comment)   3 reps of standing   Comments  transfers from w/c<>mat at same level      Ambulation/Gait   Pre-Gait Activities  Standing at counter with Bil UE support and +2 assist as above.  Pt tolerated standing 3 reps x approx 30 seconds each with rest in between.      Posture/Postural Control   Posture/Postural Control  Postural limitations    Postural Limitations  Rounded Shoulders;Forward head;Flexed trunk;Weight shift left      Exercises   Exercises  Knee/Hip      Knee/Hip Exercises: Supine   Quad Sets  Left;1 set;10 reps    Bridges  1 set;10 reps;Left    Other Supine Knee/Hip Exercises  L foot on floor off edge of mat for single leg bridge/hip extension with 5 second hold x 10 reps              PT Education - 07/23/19 1446    Education Details  Reviewed HEP       PT Short Term Goals - 07/06/19 2259      PT SHORT TERM GOAL #1   Title  Patient demonstrates understanding of initial HEP. (All STGs Target Date: 08/06/2019)    Time  4    Period  Weeks    Status  New    Target Date  08/06/19      PT SHORT TERM GOAL #2   Title  Patient sit to /from stand w/c tosink with minA    Time  1    Period  Months    Status  New    Target Date  08/06/19      PT SHORT TERM GOAL #3   Title  Patient tolerates standing statically with sink support & stool under right ischuim for 2 minutes with supervision.    Time  1    Period  Months    Status  New    Target Date  08/06/19      PT SHORT TERM GOAL #4   Title  PT to perform prosthetic assessment once he receives his prosthesis    Time  1    Period  Months    Status  New    Target Date  08/06/19        PT Long Term Goals - 07/06/19 2303      PT LONG TERM GOAL #1   Title  Patient demonstrates & verbalizes understanding of prosthetic care to enable safe utilization of prosthesis (All LTGs Target Date: 11/24/2019)    Time  20    Period  Weeks    Status  New    Target Date  11/24/19      PT LONG TERM GOAL #2   Title  Patient tolerates wear of prosthesis >80% of awake hours without skin or limb pain issues to enable function throughout his day.    Time  20    Period  Weeks    Status  New    Target Date  11/24/19      PT LONG TERM GOAL #3   Title  Standing Balance with prosthesis Berg Balance >45/56 to indicate lower  fall risk.    Time  20    Period  Weeks    Status  New    Target Date  11/24/19      PT LONG TERM GOAL #4   Title  Patient ambulates 300' with LRAD & prosthesis modified independent.    Time  20    Period  Weeks    Status  New    Target Date  11/24/19      PT LONG TERM GOAL #5   Title  Patient negotiates stairs, ramps & curbs with LRAD & prosthesis modified independent.    Time  20     Period  Weeks    Status  New    Target Date  11/24/19            Plan - 07/23/19 1447    Clinical Impression Statement  Skilled session focused on standing tolerance at sink and reviewing exercises.  Pt needing significant assistance with initial standing at counter but progressed during session.  Pt saw Dr Marcelino Scot who feels like pt has a L Rotator Cuff Tear and is sending for MRI.  Continue PT per POC.    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, T8, T12 & L3-5 fractures, Left femur fracture,  left scapula fx, open left tibia fx, pelvic ring fx, CAD, HTN, HepC    Examination-Activity Limitations  Bed Mobility;Caring for Others;Carry;Hygiene/Grooming;Lift;Locomotion Level;Reach Overhead;Squat;Stairs;Stand;Transfers    Examination-Participation Restrictions  Community Activity;Driving;Meal Prep;Volunteer;Yard Work;Other   work   Stability/Clinical Decision Making  Unstable/Unpredictable    Rehab Potential  Good    PT Frequency  2x / week    PT Duration  Other (comment)   20 weeks, but may change if patient gets Medicaid   PT Treatment/Interventions  ADLs/Self Care Home Management;Aquatic Therapy;Canalith Repostioning;Cryotherapy;Electrical Stimulation;Moist Heat;Ultrasound;DME Instruction;Gait training;Stair training;Functional mobility training;Therapeutic activities;Therapeutic exercise;Balance training;Neuromuscular re-education;Patient/family education;Prosthetic Training;Manual techniques;Scar mobilization;Passive range of motion;Dry needling;Vestibular;Joint Manipulations    PT Next Visit Plan  Follow up on if MRI of shoulder results/scheduling.  Trial of standing at counter with stool under R ischium.  Wife asking about best mattress for pt as they need new one.  F/u with  Jamey Reas on any specific recommendations on mattress.    PT Home Exercise Plan  [id number]    Consulted and Agree with Plan of Care  Patient;Family member/caregiver    Family Member Consulted  wife, Amahd Morino       Patient will benefit from skilled therapeutic intervention in order to improve the following deficits and impairments:  Abnormal gait, Decreased activity tolerance, Decreased balance, Decreased coordination, Decreased endurance, Decreased knowledge of use of DME, Decreased mobility, Decreased range of motion, Decreased skin integrity, Decreased strength, Difficulty walking, Impaired flexibility, Impaired UE functional use, Postural dysfunction, Prosthetic Dependency  Visit Diagnosis: 1. Muscle weakness (generalized)   2. Unsteadiness on feet   3. Contracture of muscle, multiple sites   4. Other abnormalities of gait and mobility   5. Abnormal posture        Problem List Patient Active Problem List   Diagnosis Date Noted  . Chronic hepatitis C without hepatic coma ([place]) 04/22/2019  . Osteomyelitis of right tibia ([place]) 04/11/2019  . Open left tibial fracture 03/02/2019  . Unspecified injury of popliteal artery, right leg, initial encounter 03/02/2019  . Popliteal vein injury, right, initial encounter  03/02/2019  . 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 Pelvic ring fracture (HCC), Right  03/02/2019  . Left scapula fracture 03/02/2019  . Right knee dislocation 03/02/2019  . Femur fracture, left (HCC) 02/28/2019    Newell Coral, PTA [facility] 07/23/19 2:59 PM Phone: [phone] Fax: [phone]   [facility] [address] [place], [place], [address] Phone: [phone]   Fax:  [phone]  Name: Travis Benson MRN: [id number] Date of Birth: 06/05/1961

## 2019-07-26 ENCOUNTER — Ambulatory Visit: Payer: Medicaid Other | Admitting: Physical Therapy

## 2019-07-30 ENCOUNTER — Ambulatory Visit: Payer: Self-pay | Admitting: Family Medicine

## 2019-08-03 ENCOUNTER — Ambulatory Visit: Payer: Medicaid Other | Attending: Family Medicine | Admitting: Physical Therapy

## 2019-08-03 ENCOUNTER — Other Ambulatory Visit: Payer: Self-pay

## 2019-08-03 ENCOUNTER — Encounter: Payer: Self-pay | Admitting: Physical Therapy

## 2019-08-03 DIAGNOSIS — M25612 Stiffness of left shoulder, not elsewhere classified: Secondary | ICD-10-CM | POA: Diagnosis present

## 2019-08-03 DIAGNOSIS — M25672 Stiffness of left ankle, not elsewhere classified: Secondary | ICD-10-CM | POA: Diagnosis present

## 2019-08-03 DIAGNOSIS — M6281 Muscle weakness (generalized): Secondary | ICD-10-CM | POA: Diagnosis present

## 2019-08-03 DIAGNOSIS — R2681 Unsteadiness on feet: Secondary | ICD-10-CM | POA: Insufficient documentation

## 2019-08-03 DIAGNOSIS — M25651 Stiffness of right hip, not elsewhere classified: Secondary | ICD-10-CM | POA: Diagnosis present

## 2019-08-03 DIAGNOSIS — R2689 Other abnormalities of gait and mobility: Secondary | ICD-10-CM | POA: Diagnosis present

## 2019-08-03 DIAGNOSIS — R29898 Other symptoms and signs involving the musculoskeletal system: Secondary | ICD-10-CM | POA: Insufficient documentation

## 2019-08-03 DIAGNOSIS — M25662 Stiffness of left knee, not elsewhere classified: Secondary | ICD-10-CM | POA: Insufficient documentation

## 2019-08-03 DIAGNOSIS — R293 Abnormal posture: Secondary | ICD-10-CM | POA: Diagnosis present

## 2019-08-03 DIAGNOSIS — M6249 Contracture of muscle, multiple sites: Secondary | ICD-10-CM | POA: Insufficient documentation

## 2019-08-03 DIAGNOSIS — M25652 Stiffness of left hip, not elsewhere classified: Secondary | ICD-10-CM | POA: Diagnosis present

## 2019-08-04 NOTE — Therapy (Signed)
[facility] [address] [place], [place], [address] Phone: [phone]   Fax:  [phone]  Physical Therapy Treatment  Patient Details  Name: Travis Benson MRN: [id number] Date of Birth: Jul 12, 1961 Referring Provider (PT): Antony Blackbird, MD   Encounter Date: 08/03/2019  PT End of Session - 08/03/19 1023    Visit Number  4    Number of Visits  41   May change if he receives Medicaid as only 27 visits /year allowed   Date for PT Re-Evaluation  11/24/19    Authorization Type  Medicaid pending / Self -pay  Per PT recommendation applying for [facility] Financial Assistance Program    PT Start Time  2951    PT Stop Time  1056    PT Time Calculation (min)  38 min    Equipment Utilized During Treatment  Gait belt    Activity Tolerance  Patient tolerated treatment well;Patient limited by fatigue    Behavior During Therapy  [facility] for tasks assessed/performed       Past Medical History:  Diagnosis Date  . Coronary artery disease   . History of hepatitis C   . Hypertension   . 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 Left scapula fracture 03/02/2019  . Open left tibial fracture 03/02/2019  . Pelvic ring fracture ([place]), Right  03/02/2019  . Right knee dislocation 03/02/2019  . Unspecified injury of popliteal artery, right leg, initial encounter 03/02/2019    Past Surgical History:  Procedure Laterality Date  . AMPUTATION Right 04/13/2019   Procedure: AMPUTATION ABOVE KNEE;  Surgeon: Altamese Buena Vista, MD;  Location: [place];  Service: Orthopedics;  Laterality: Right;  . APPLICATION OF WOUND VAC Bilateral 03/05/2019   Procedure: WOUND VAC CHANGE RIGHT LOWER LEG; REMOVAL OF WOUND VAC LEFT LOWER LEG WITH APPLICATION OF DRESSINGS;  Surgeon: Rosetta Posner, MD;  Location: [place];  Service: Vascular;  Laterality: Bilateral;  . APPLICATION OF WOUND VAC Right 04/09/2019   Procedure: APPLICATION OF WOUND VAC;  Surgeon: Altamese Athens, MD;  Location:  [place];  Service: Orthopedics;  Laterality: Right;  . APPLICATION OF WOUND VAC Bilateral 03/02/2019   Procedure: Application Of Wound Vac;  Surgeon: Altamese Franklin, MD;  Location: [place];  Service: Orthopedics;  Laterality: Bilateral;  . APPLICATION OF WOUND VAC Right 03/02/2019   Procedure: Wound Vac dressing removal;  Surgeon: Waynetta Sandy, MD;  Location: [place];  Service: Vascular;  Laterality: Right;  . EXTERNAL FIXATION LEG Right 03/02/2019   Procedure: External Fixation Leg;  Surgeon: Altamese Chauncey, MD;  Location: [place];  Service: Orthopedics;  Laterality: Right;  . EXTERNAL FIXATION REMOVAL Left 03/02/2019   Procedure: REMOVAL EXTERNAL FIXATION LEG;  Surgeon: Altamese Sanford, MD;  Location: [place];  Service: Orthopedics;  Laterality: Left;  . FASCIOTOMY Right 02/28/2019   Procedure: FOUR COMPARTMENT FASCIOTOMY OF RIGHT LOWER LEG;  Surgeon: Waynetta Sandy, MD;  Location: [place];  Service: Vascular;  Laterality: Right;  . FEMORAL-POPLITEAL BYPASS GRAFT Right 02/28/2019   Procedure: BYPASS GRAFT OF ABOVE KNEE POPLITEAL- BELOW KNEE POPLITEAL ARTERY;  Surgeon: Waynetta Sandy, MD;  Location: [place];  Service: Vascular;  Laterality: Right;  . FEMUR IM NAIL Left 03/02/2019   Procedure: INTRAMEDULLARY (IM) RETROGRADE FEMORAL NAILING;  Surgeon: Altamese Edenton, MD;  Location: [place];  Service: Orthopedics;  Laterality: Left;  . I&D EXTREMITY Left 02/28/2019   Procedure: IRRIGATION AND DEBRIDEMENT OF LEFT LOWER LEG /INTERNAL FIXATION OF LEFT  TIBIA PLACEMENT OF FRACTURE PINLEFT FEMUR/ CLOSED REDUCTION OF LEFT FEMUR.;  Surgeon: Paralee Cancel, MD;  Location: [place];  Service: Orthopedics;  Laterality: Left;  . I&D EXTREMITY Right 04/09/2019   Procedure: IRRIGATION AND DEBRIDEMENT EXTREMITY R lower leg;  Surgeon: Altamese Ellensburg, MD;  Location: [place];  Service: Orthopedics;  Laterality: Right;  . I&D EXTREMITY Right 04/13/2019   Procedure: IRRIGATION AND DEBRIDEMENT EXTREMITY Right Leg;  Surgeon:  Altamese Pittsfield, MD;  Location: [place];  Service: Orthopedics;  Laterality: Right;  . I&D EXTREMITY Left 03/02/2019   Procedure: IRRIGATION AND DEBRIDEMENT LEG;  Surgeon: Altamese Raceland, MD;  Location: [place];  Service: Orthopedics;  Laterality: Left;  . ORIF PELVIC FRACTURE WITH PERCUTANEOUS SCREWS Right 03/02/2019   Procedure: Orif Pelvic Fracture With Percutaneous Screws;  Surgeon: Altamese Claypool, MD;  Location: [place];  Service: Orthopedics;  Laterality: Right;  . SKIN SPLIT GRAFT Right 03/26/2019   Procedure: SKIN GRAFT SPLIT THICKNESS;  Surgeon: Altamese [place], MD;  Location: [place];  Service: Orthopedics;  Laterality: Right;  . TIBIA IM NAIL INSERTION Left 03/02/2019   Procedure: INTRAMEDULLARY (IM) NAIL TIBIAL;  Surgeon: Altamese Bayside, MD;  Location: [place];  Service: Orthopedics;  Laterality: Left;    There were no vitals filed for this visit.  Subjective Assessment - 08/03/19 1021    Subjective  No new complaints. Has tried standing with spouse at sink. Pt reports 1st time was rough, the next few times were better as he was more upright. Saw Chris at [facility]. Unable to cast until he can stand in parallel bars for at least 3-4 minutes with pt self supporting him self. Has not had MRI of shoulder, [facility] did not have referral when he called, so he plans to call Dr. Carlean Jews office about this. Has a vascular appt on 08/25/19 for a follow up as well for bil LE's.    Pertinent History  CAD, Hep C, HTN    Patient Stated Goals  To get stronger to get prosthesis, Then when get prosthesis, return to normalicy including work, move when wants, not be dependent on others.    Currently in Pain?  No/denies              [facility] Adult PT Treatment/Exercise - 08/03/19 1028      Transfers   Transfers  Sit to Stand;Stand to Sit    Sit to Stand  1: +2 Total assist;3: Mod assist;2: Max assist    Sit to Stand Details  Tactile cues for sequencing;Tactile cues for weight shifting;Tactile cues for  posture;Verbal cues for sequencing;Verbal cues for technique;Verbal cues for precautions/safety;Verbal cues for safe use of DME/AE;Manual facilitation for weight shifting    Sit to Stand Details (indicate cue type and reason)  attempted one person assist at sink as pt reported standing at sink with spouse. Unable to safety stand with max assist of PTA. with 2 person assist (PTA and rehab tech) achieved standing with mod/max assist of 2. standing at sink for ~1-2 minutes before needed to sit down with cues/facilitation for upright posture. transitioned to use of Stedy lift with mod/max assist of one to stand from pads of lift (2 person max assist for inital stand to get pads in place)    Stand to Sit  3: Mod assist    Stand to Sit Details (indicate cue type and reason)  Tactile cues for sequencing;Tactile cues for weight shifting;Tactile cues for posture;Tactile cues for placement;Verbal cues for precautions/safety;Verbal cues for technique;Verbal cues  for safe use of DME/AE;Manual facilitation for weight shifting    Stand to Sit Details  assistance needed with all transfers to sitting for controlled descent.     Comments  with Stedy assist- stood for 3 minutes on 1st rep, 2.5 minutes on 2cd rep.       Knee/Hip Exercises: Supine   Bridges  AROM;Strengthening;Both;2 sets;10 reps;Limitations    Bridges Limitations  1st set with residual limb in air/just left LE working; 2cd set with residual limb on bolster for 10 reps. cues for full lift and core engagement with each rep.     Other Supine Knee/Hip Exercises  in hooklying with residual limb on bolster as needed with green band around thighs- right hip abdcution for 10 reps, left hip fall outs for 10 reps, bil marching/hip flexion for 10 reps each side. cues for slow, controlled movements.           PT Short Term Goals - 08/03/19 1025      PT SHORT TERM GOAL #1   Title  Patient demonstrates understanding of initial HEP. (All STGs Target Date:  08/06/2019)    Baseline  08/03/19: met with current program, will need to update soon    Status  Achieved    Target Date  08/06/19      PT SHORT TERM GOAL #2   Title  Patient sit to /from stand w/c tosink with minA    Baseline  08/03/19: requires 2 person max assist    Time  --    Period  --    Status  Not Met    Target Date  08/06/19      PT SHORT TERM GOAL #3   Title  Patient tolerates standing statically with sink support & stool under right ischuim for 2 minutes with supervision.    Baseline  08/03/19: able to stand without stool in Stedy lift assist for 2-3 minutes with min guard to min assist for balance.    Time  --    Period  --    Status  Partially Met    Target Date  08/06/19      PT SHORT TERM GOAL #4   Title  PT to perform prosthetic assessment once he receives his prosthesis    Baseline  08/03/19: has not been casted to date.    Status  Deferred    Target Date  08/06/19        PT Long Term Goals - 07/06/19 2303      PT LONG TERM GOAL #1   Title  Patient demonstrates & verbalizes understanding of prosthetic care to enable safe utilization of prosthesis (All LTGs Target Date: 11/24/2019)    Time  20    Period  Weeks    Status  New    Target Date  11/24/19      PT LONG TERM GOAL #2   Title  Patient tolerates wear of prosthesis >80% of awake hours without skin or limb pain issues to enable function throughout his day.    Time  20    Period  Weeks    Status  New    Target Date  11/24/19      PT LONG TERM GOAL #3   Title  Standing Balance with prosthesis Berg Balance >45/56 to indicate lower fall risk.    Time  20    Period  Weeks    Status  New    Target Date  11/24/19  PT LONG TERM GOAL #4   Title  Patient ambulates 300' with LRAD & prosthesis modified independent.    Time  20    Period  Weeks    Status  New    Target Date  11/24/19      PT LONG TERM GOAL #5   Title  Patient negotiates stairs, ramps & curbs with LRAD & prosthesis modified independent.     Time  20    Period  Weeks    Status  New    Target Date  11/24/19            Plan - 08/03/19 1023    Clinical Impression Statement  Today's skilled session intially attempted standing at sink again with 2 person max/total assist needed to achieve standing. Transitioned to use of Stedy lift assist with mod/max assist needed to achieve standing. Once in standing pt able to maintain witstwowh min assisit for 2-3 minutes each. Remainder of session focused on LE/core strengthening. The pt did not meet all STGs set for this period. He is making slow, steady progress toward goals.    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, T8, T12 & L3-5 fractures, Left femur fracture,  left scapula fx, open left tibia fx, pelvic ring fx, CAD, HTN, HepC    Examination-Activity Limitations  Bed Mobility;Caring for Others;Carry;Hygiene/Grooming;Lift;Locomotion Level;Reach Overhead;Squat;Stairs;Stand;Transfers    Examination-Participation Restrictions  Community Activity;Driving;Meal Prep;Volunteer;Yard Work;Other   work   Stability/Clinical Decision Making  Unstable/Unpredictable    Rehab Potential  Good    PT Frequency  2x / week    PT Duration  Other (comment)   20 weeks, but may change if patient gets Medicaid   PT Treatment/Interventions  ADLs/Self Care Home Management;Aquatic Therapy;Canalith Repostioning;Cryotherapy;Electrical Stimulation;Moist Heat;Ultrasound;DME Instruction;Gait training;Stair training;Functional mobility training;Therapeutic activities;Therapeutic exercise;Balance training;Neuromuscular re-education;Patient/family education;Prosthetic Training;Manual techniques;Scar mobilization;Passive range of motion;Dry needling;Vestibular;Joint Manipulations    PT Next Visit Plan  See  if MRI of shoulder has been completed with results. Continue to address strengthening. Have 2 person assist for any standing attempts. Spouse asking aobut best mattress for pt as they are looking to get a new one. Defer to primary PT on this.    PT Home Exercise Plan  [id number]    Consulted and Agree with Plan of Care  Patient;Family member/caregiver    Family Member Consulted  wife, Koby Pickup       Patient will benefit from skilled therapeutic intervention in order to improve the following deficits and impairments:  Abnormal gait, Decreased activity tolerance, Decreased balance, Decreased coordination, Decreased endurance, Decreased knowledge of use of DME, Decreased mobility, Decreased range of motion, Decreased skin integrity, Decreased strength, Difficulty walking, Impaired flexibility, Impaired UE functional use, Postural dysfunction, Prosthetic Dependency  Visit Diagnosis: 1. Muscle weakness (generalized)   2. Unsteadiness on feet   3. Contracture of muscle, multiple sites   4. Abnormal posture   5. Other symptoms and signs involving the musculoskeletal system   6. Stiffness of left shoulder, not elsewhere classified   7. Stiffness of left ankle, not elsewhere classified   8. Stiffness of left hip, not elsewhere classified   9. Stiffness of left knee, not elsewhere classified   10. Stiffness of right hip, not elsewhere classified        Problem List Patient Active Problem List   Diagnosis Date Noted  .  Chronic hepatitis C without hepatic coma ([place]) 04/22/2019  . Osteomyelitis of right tibia ([place]) 04/11/2019  . Open left tibial fracture 03/02/2019  . Unspecified injury of popliteal artery, right leg, initial encounter 03/02/2019  . Popliteal vein injury, right, initial encounter 03/02/2019  . 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 Pelvic ring fracture ([place]), Right  03/02/2019  . Left scapula fracture 03/02/2019  . Right knee dislocation 03/02/2019  . Femur  fracture, left ([place]) 02/28/2019    Willow Ora, PTA, [facility] [address], [place], [place] [address] [phone] 08/04/19, 9:51 AM   Name: Travis Benson MRN: [id number] Date of Birth: [birth year]

## 2019-08-05 ENCOUNTER — Ambulatory Visit: Payer: Medicaid Other | Admitting: Physical Therapy

## 2019-08-05 ENCOUNTER — Encounter: Payer: Self-pay | Admitting: Physical Therapy

## 2019-08-05 ENCOUNTER — Other Ambulatory Visit: Payer: Self-pay

## 2019-08-05 DIAGNOSIS — R293 Abnormal posture: Secondary | ICD-10-CM

## 2019-08-05 DIAGNOSIS — M6249 Contracture of muscle, multiple sites: Secondary | ICD-10-CM

## 2019-08-05 DIAGNOSIS — R29898 Other symptoms and signs involving the musculoskeletal system: Secondary | ICD-10-CM

## 2019-08-05 DIAGNOSIS — M6281 Muscle weakness (generalized): Secondary | ICD-10-CM

## 2019-08-05 DIAGNOSIS — R2681 Unsteadiness on feet: Secondary | ICD-10-CM

## 2019-08-05 MED FILL — ?AMLODIPINE BESYLATE 5MG TA: 5 | 30 days supply | Qty: 30 | Fill #1

## 2019-08-05 NOTE — Therapy (Addendum)
[facility] [address] [place], [place], [address] Phone: [phone]   Fax:  [phone]  Physical Therapy Treatment  Patient Details  Name: Travis Benson MRN: [id number] Date of Birth: [birth year] Referring Provider (PT): Antony Blackbird, MD   Encounter Date: 08/05/2019  PT End of Session - 08/05/19 1023    Visit Number  5    Number of Visits  41   May change if he receives Medicaid as only 27 visits /year allowed   Date for PT Re-Evaluation  11/24/19    Authorization Type  Medicaid pending / Self -pay  Per PT recommendation applying for [facility] Financial Assistance Program    PT Start Time  1018    PT Stop Time  1100    PT Time Calculation (min)  42 min    Equipment Utilized During Treatment  Gait belt    Activity Tolerance  Patient tolerated treatment well;Patient limited by fatigue    Behavior During Therapy  [facility] for tasks assessed/performed       Past Medical History:  Diagnosis Date  . Coronary artery disease   . History of hepatitis C   . Hypertension   . 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 Left scapula fracture 03/02/2019  . Open left tibial fracture 03/02/2019  . Pelvic ring fracture ([place]), Right  03/02/2019  . Right knee dislocation 03/02/2019  . Unspecified injury of popliteal artery, right leg, initial encounter 03/02/2019    Past Surgical History:  Procedure Laterality Date  . AMPUTATION Right 04/13/2019   Procedure: AMPUTATION ABOVE KNEE;  Surgeon: Altamese Trevose, MD;  Location: [facility];  Service: Orthopedics;  Laterality: Right;  . APPLICATION OF WOUND VAC Bilateral 03/05/2019   Procedure: WOUND VAC CHANGE RIGHT LOWER LEG; REMOVAL OF WOUND VAC LEFT LOWER LEG WITH APPLICATION OF DRESSINGS;  Surgeon: Rosetta Posner, MD;  Location: [facility];  Service: Vascular;  Laterality: Bilateral;  . APPLICATION OF WOUND VAC Right 04/09/2019   Procedure: APPLICATION OF WOUND VAC;  Surgeon: Altamese Alpharetta, MD;  Location:  [facility];  Service: Orthopedics;  Laterality: Right;  . APPLICATION OF WOUND VAC Bilateral 03/02/2019   Procedure: Application Of Wound Vac;  Surgeon: Altamese Brookfield, MD;  Location: [facility];  Service: Orthopedics;  Laterality: Bilateral;  . APPLICATION OF WOUND VAC Right 03/02/2019   Procedure: Wound Vac dressing removal;  Surgeon: Waynetta Sandy, MD;  Location: [facility];  Service: Vascular;  Laterality: Right;  . EXTERNAL FIXATION LEG Right 03/02/2019   Procedure: External Fixation Leg;  Surgeon: Altamese Cusseta, MD;  Location: [facility];  Service: Orthopedics;  Laterality: Right;  . EXTERNAL FIXATION REMOVAL Left 03/02/2019   Procedure: REMOVAL EXTERNAL FIXATION LEG;  Surgeon: Altamese [place], MD;  Location: [facility];  Service: Orthopedics;  Laterality: Left;  . FASCIOTOMY Right 02/28/2019   Procedure: FOUR COMPARTMENT FASCIOTOMY OF RIGHT LOWER LEG;  Surgeon: Waynetta Sandy, MD;  Location: [facility];  Service: Vascular;  Laterality: Right;  . FEMORAL-POPLITEAL BYPASS GRAFT Right 02/28/2019   Procedure: BYPASS GRAFT OF ABOVE KNEE POPLITEAL- BELOW KNEE POPLITEAL ARTERY;  Surgeon: Waynetta Sandy, MD;  Location: [facility];  Service: Vascular;  Laterality: Right;  . FEMUR IM NAIL Left 03/02/2019   Procedure: INTRAMEDULLARY (IM) RETROGRADE FEMORAL NAILING;  Surgeon: Altamese Wilhoit, MD;  Location: [facility];  Service: Orthopedics;  Laterality: Left;  . I&D EXTREMITY Left 02/28/2019   Procedure: IRRIGATION AND DEBRIDEMENT OF LEFT LOWER LEG /INTERNAL FIXATION OF LEFT  TIBIA PLACEMENT OF FRACTURE PINLEFT FEMUR/ CLOSED REDUCTION OF LEFT FEMUR.;  Surgeon: Paralee Cancel, MD;  Location: [facility];  Service: Orthopedics;  Laterality: Left;  . I&D EXTREMITY Right 04/09/2019   Procedure: IRRIGATION AND DEBRIDEMENT EXTREMITY R lower leg;  Surgeon: Altamese [place] Hills, MD;  Location: [facility];  Service: Orthopedics;  Laterality: Right;  . I&D EXTREMITY Right 04/13/2019   Procedure: IRRIGATION AND DEBRIDEMENT EXTREMITY Right Leg;  Surgeon:  Altamese [place]osa East, MD;  Location: [facility];  Service: Orthopedics;  Laterality: Right;  . I&D EXTREMITY Left 03/02/2019   Procedure: IRRIGATION AND DEBRIDEMENT LEG;  Surgeon: Altamese Gaithersburg, MD;  Location: [facility];  Service: Orthopedics;  Laterality: Left;  . ORIF PELVIC FRACTURE WITH PERCUTANEOUS SCREWS Right 03/02/2019   Procedure: Orif Pelvic Fracture With Percutaneous Screws;  Surgeon: Altamese [place]combe, MD;  Location: [facility];  Service: Orthopedics;  Laterality: Right;  . SKIN SPLIT GRAFT Right 03/26/2019   Procedure: SKIN GRAFT SPLIT THICKNESS;  Surgeon: Altamese [place] West, MD;  Location: [facility];  Service: Orthopedics;  Laterality: Right;  . TIBIA IM NAIL INSERTION Left 03/02/2019   Procedure: INTRAMEDULLARY (IM) NAIL TIBIAL;  Surgeon: Altamese [place]hee, MD;  Location: [facility];  Service: Orthopedics;  Laterality: Left;    There were no vitals filed for this visit.  Subjective Assessment - 08/05/19 1022    Subjective  Still need to call Dr. Marcelino Scot (spouse doing this during the session). No falls. Was sore after last session. no pain.    Patient is accompained by:  Family member    Pertinent History  CAD, Hep C, HTN    Patient Stated Goals  To get stronger to get prosthesis, Then when get prosthesis, return to normalicy including work, move when wants, not be dependent on others.             [facility] Adult PT Treatment/Exercise - 08/05/19 1023      Transfers   Transfers  Sit to Stand;Stand to Sit;Lateral/Scoot Transfers    Sit to Stand  3: Mod assist;4: Min assist    Sit to Stand Details  Tactile cues for sequencing;Tactile cues for weight shifting;Verbal cues for sequencing;Verbal cues for technique;Verbal cues for safe use of DME/AE;Manual facilitation for weight shifting    Sit to Stand Details (indicate cue type and reason)  mod assist to stand from elevated mat table into Stedy lift assist. once in steady pt needed min to min gaurd assist to stand from pads of stedy with cues to power up through LE/UE's.      Stand to Sit  4: Min assist;3: Mod assist    Stand to Sit Details (indicate cue type and reason)  Tactile cues for weight shifting;Tactile cues for posture;Verbal cues for sequencing;Verbal cues for technique;Verbal cues for precautions/safety;Manual facilitation for weight shifting    Stand to Sit Details  cues to bend at hips and lower down slowly with use of LE/UE's with each rep.     Lateral/Scoot Transfers  5: Supervision    Lateral/Scoot Transfer Details (indicate cue type and reason)  wheelchair to/from mat table    Number of Reps  10 reps   consecutive reps with cues on form/technique   Comments  with Stedy assist lift: performed 2 stands with activity, then 10 consecutive stands to work on form/technique with sit<>stands; 1st stand: stood with min guard assist for 5 minutes with heavy UE reliance with min guard assist for balance, cues/facilitation for posture, 2cd stand- had pt work on right limb hip extension, hip  abduction, and then hip flexion for 10 reps each, left single leg mini squats for 10 reps each. all with min guard to min assist for balance/posture.       Knee/Hip Exercises: Seated   Long Arc Quad  AROM;Strengthening;Left;2 sets;10 reps;Weights;Limitations    Long Arc Quad Weight  3 lbs.    Long [employer] Limitations  cues for full knee extension and slow, controlled movements.     Hamstring Curl  AROM;Strengthening;Left;2 sets;10 reps;Limitations    Hamstring Limitations  with green band resistance. cues for slow, controlled movements.                PT Short Term Goals - 08/03/19 1025      PT SHORT TERM GOAL #1   Title  Patient demonstrates understanding of initial HEP. (All STGs Target Date: 08/06/2019)    Baseline  08/03/19: met with current program, will need to update soon    Status  Achieved    Target Date  08/06/19      PT SHORT TERM GOAL #2   Title  Patient sit to /from stand w/c tosink with minA    Baseline  08/03/19: requires 2 person max assist    Time   --    Period  --    Status  Not Met    Target Date  08/06/19      PT SHORT TERM GOAL #3   Title  Patient tolerates standing statically with sink support & stool under right ischuim for 2 minutes with supervision.    Baseline  08/03/19: able to stand without stool in Stedy lift assist for 2-3 minutes with min guard to min assist for balance.    Time  --    Period  --    Status  Partially Met    Target Date  08/06/19      PT SHORT TERM GOAL #4   Title  PT to perform prosthetic assessment once he receives his prosthesis    Baseline  08/03/19: has not been casted to date.    Status  Deferred    Target Date  08/06/19        PT Long Term Goals - 07/06/19 2303      PT LONG TERM GOAL #1   Title  Patient demonstrates & verbalizes understanding of prosthetic care to enable safe utilization of prosthesis (All LTGs Target Date: 11/24/2019)    Time  20    Period  Weeks    Status  New    Target Date  11/24/19      PT LONG TERM GOAL #2   Title  Patient tolerates wear of prosthesis >80% of awake hours without skin or limb pain issues to enable function throughout his day.    Time  20    Period  Weeks    Status  New    Target Date  11/24/19      PT LONG TERM GOAL #3   Title  Standing Balance with prosthesis Berg Balance >45/56 to indicate lower fall risk.    Time  20    Period  Weeks    Status  New    Target Date  11/24/19      PT LONG TERM GOAL #4   Title  Patient ambulates 300' with LRAD & prosthesis modified independent.    Time  20    Period  Weeks    Status  New    Target Date  11/24/19  PT LONG TERM GOAL #5   Title  Patient negotiates stairs, ramps & curbs with LRAD & prosthesis modified independent.    Time  20    Period  Weeks    Status  New    Target Date  11/24/19            Plan - 08/05/19 1023    Clinical Impression Statement  Today's skilled session continued to address LE strengthening, sit<>stand transfers and standing with use of Stedy lift assist.  The pt is making steady progress toward goals and should benefit from continued PT to progress toward unmet goals.    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, T8, T12 & L3-5 fractures, Left femur fracture,  left scapula fx, open left tibia fx, pelvic ring fx, CAD, HTN, HepC    Examination-Activity Limitations  Bed Mobility;Caring for Others;Carry;Hygiene/Grooming;Lift;Locomotion Level;Reach Overhead;Squat;Stairs;Stand;Transfers    Examination-Participation Restrictions  Community Activity;Driving;Meal Prep;Volunteer;Yard Work;Other   work   Stability/Clinical Decision Making  Unstable/Unpredictable    Rehab Potential  Good    PT Frequency  2x / week    PT Duration  Other (comment)   20 weeks, but may change if patient gets Medicaid   PT Treatment/Interventions  ADLs/Self Care Home Management;Aquatic Therapy;Canalith Repostioning;Cryotherapy;Electrical Stimulation;Moist Heat;Ultrasound;DME Instruction;Gait training;Stair training;Functional mobility training;Therapeutic activities;Therapeutic exercise;Balance training;Neuromuscular re-education;Patient/family education;Prosthetic Training;Manual techniques;Scar mobilization;Passive range of motion;Dry needling;Vestibular;Joint Manipulations    PT Next Visit Plan  See if MRI of shoulder has been completed with results. Continue to address strengthening. Have 2 person assist for any standing attempts not using Stedy lift assist. Spouse asking aobut best mattress for pt as they are looking to get a new one. Defer to primary PT on this.    PT Home Exercise Plan  [id number]    Consulted and Agree with Plan of Care  Patient;Family member/caregiver    Family Member Consulted  wife, Elery Cadenhead       Patient will benefit from skilled  therapeutic intervention in order to improve the following deficits and impairments:  Abnormal gait, Decreased activity tolerance, Decreased balance, Decreased coordination, Decreased endurance, Decreased knowledge of use of DME, Decreased mobility, Decreased range of motion, Decreased skin integrity, Decreased strength, Difficulty walking, Impaired flexibility, Impaired UE functional use, Postural dysfunction, Prosthetic Dependency  Visit Diagnosis: 1. Muscle weakness (generalized)   2. Unsteadiness on feet   3. Contracture of muscle, multiple sites   4. Abnormal posture   5. Other symptoms and signs involving the musculoskeletal system        Problem List Patient Active Problem List   Diagnosis Date Noted  . Chronic hepatitis C without hepatic coma ([facility]) 04/22/2019  . Osteomyelitis of right tibia ([place]) 04/11/2019  . Open left tibial fracture 03/02/2019  . Unspecified injury of popliteal artery, right leg, initial encounter 03/02/2019  . Popliteal vein injury, right, initial encounter 03/02/2019  . 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 Pelvic ring fracture ([facility]), Right  03/02/2019  . Left scapula fracture 03/02/2019  . Right knee dislocation 03/02/2019  . Femur fracture, left ([facility]) 02/28/2019    Willow Ora, PTA, [facility] [address], [place], [place] [address] [phone] 08/05/19, 11:59 AM   Name: Travis Benson MRN: [id number] Date of Birth: 1961/01/20  PT Short Term Goals - 08/12/19 1714  PT SHORT TERM GOAL #1   Title  Patient demonstrates understanding of updated HEP. (All STGs Target Date: 09/10/2019)    Time  4    Period  Weeks    Status  On-going    Target Date  09/10/19      PT SHORT TERM GOAL #2   Title  Patient sit to /from stand w/c tosink with minA    Baseline  --    Time  4    Period  Weeks    Status  On-going    Target Date  09/10/19      PT SHORT TERM GOAL #3   Title  Patient tolerates standing  statically with sink support & stool under right ischuim for 2 minutes with supervision.    Baseline  --    Time  4    Period  Weeks    Status  On-going    Target Date  09/10/19      PT SHORT TERM GOAL #4   Title  PT to perform prosthetic assessment once he receives his prosthesis    Baseline  --    Status  Deferred    Target Date  09/10/19      Jamey Reas, PT, DPT PT Specializing in [place] 08/12/19 5:17 PM Phone:  [phone]  Fax:  [phone] [facility] [address] [place], [place] [address]

## 2019-08-10 ENCOUNTER — Other Ambulatory Visit: Payer: Self-pay

## 2019-08-10 ENCOUNTER — Ambulatory Visit: Payer: Medicaid Other | Admitting: Physical Therapy

## 2019-08-10 ENCOUNTER — Encounter: Payer: Self-pay | Admitting: Physical Therapy

## 2019-08-10 DIAGNOSIS — M6281 Muscle weakness (generalized): Secondary | ICD-10-CM

## 2019-08-10 DIAGNOSIS — M6249 Contracture of muscle, multiple sites: Secondary | ICD-10-CM

## 2019-08-10 DIAGNOSIS — R2681 Unsteadiness on feet: Secondary | ICD-10-CM

## 2019-08-10 DIAGNOSIS — R29898 Other symptoms and signs involving the musculoskeletal system: Secondary | ICD-10-CM

## 2019-08-10 DIAGNOSIS — R293 Abnormal posture: Secondary | ICD-10-CM

## 2019-08-11 NOTE — Therapy (Signed)
[facility] [address] [place], [place], [address] Phone: [phone]   Fax:  [phone]  Physical Therapy Treatment  Patient Details  Name: Travis Benson MRN: [id number] Date of Birth: Feb 13, 1961 Referring Provider (PT): Antony Blackbird, MD   Encounter Date: 08/10/2019  PT End of Session - 08/10/19 1022    Visit Number  6    Number of Visits  41   May change if he receives Medicaid as only 27 visits /year allowed   Date for PT Re-Evaluation  11/24/19    Authorization Type  Medicaid pending / Self -pay  Per PT recommendation applying for [employer] Assistance Program    PT Start Time  1017    PT Stop Time  1100    PT Time Calculation (min)  43 min    Equipment Utilized During Treatment  Gait belt    Activity Tolerance  Patient tolerated treatment well;Patient limited by fatigue    Behavior During Therapy  [facility] for tasks assessed/performed       Past Medical History:  Diagnosis Date  . Coronary artery disease   . History of hepatitis C   . Hypertension   . 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 Left scapula fracture 03/02/2019  . Open left tibial fracture 03/02/2019  . Pelvic ring fracture ([place]), Right  03/02/2019  . Right knee dislocation 03/02/2019  . Unspecified injury of popliteal artery, right leg, initial encounter 03/02/2019    Past Surgical History:  Procedure Laterality Date  . AMPUTATION Right 04/13/2019   Procedure: AMPUTATION ABOVE KNEE;  Surgeon: Altamese [place], MD;  Location: [facility];  Service: Orthopedics;  Laterality: Right;  . APPLICATION OF WOUND VAC Bilateral 03/05/2019   Procedure: WOUND VAC CHANGE RIGHT LOWER LEG; REMOVAL OF WOUND VAC LEFT LOWER LEG WITH APPLICATION OF DRESSINGS;  Surgeon: Rosetta Posner, MD;  Location: [facility];  Service: Vascular;  Laterality: Bilateral;  . APPLICATION OF WOUND VAC Right 04/09/2019   Procedure: APPLICATION OF WOUND VAC;  Surgeon: Altamese [place], MD;  Location:  [place];  Service: Orthopedics;  Laterality: Right;  . APPLICATION OF WOUND VAC Bilateral 03/02/2019   Procedure: Application Of Wound Vac;  Surgeon: Altamese Sylvarena, MD;  Location: [facility];  Service: Orthopedics;  Laterality: Bilateral;  . APPLICATION OF WOUND VAC Right 03/02/2019   Procedure: Wound Vac dressing removal;  Surgeon: Waynetta Sandy, MD;  Location: [place];  Service: Vascular;  Laterality: Right;  . EXTERNAL FIXATION LEG Right 03/02/2019   Procedure: External Fixation Leg;  Surgeon: Altamese [place], MD;  Location: [place];  Service: Orthopedics;  Laterality: Right;  . EXTERNAL FIXATION REMOVAL Left 03/02/2019   Procedure: REMOVAL EXTERNAL FIXATION LEG;  Surgeon: Altamese Rabun, MD;  Location: [place];  Service: Orthopedics;  Laterality: Left;  . FASCIOTOMY Right 02/28/2019   Procedure: FOUR COMPARTMENT FASCIOTOMY OF RIGHT LOWER LEG;  Surgeon: Waynetta Sandy, MD;  Location: [facility];  Service: Vascular;  Laterality: Right;  . FEMORAL-POPLITEAL BYPASS GRAFT Right 02/28/2019   Procedure: BYPASS GRAFT OF ABOVE KNEE POPLITEAL- BELOW KNEE POPLITEAL ARTERY;  Surgeon: Waynetta Sandy, MD;  Location: [facility];  Service: Vascular;  Laterality: Right;  . FEMUR IM NAIL Left 03/02/2019   Procedure: INTRAMEDULLARY (IM) RETROGRADE FEMORAL NAILING;  Surgeon: Altamese Lesslie, MD;  Location: [facility];  Service: Orthopedics;  Laterality: Left;  . I&D EXTREMITY Left 02/28/2019   Procedure: IRRIGATION AND DEBRIDEMENT OF LEFT LOWER LEG /INTERNAL FIXATION OF LEFT  TIBIA PLACEMENT OF FRACTURE PINLEFT FEMUR/ CLOSED REDUCTION OF LEFT FEMUR.;  Surgeon: Paralee Cancel, MD;  Location: [facility];  Service: Orthopedics;  Laterality: Left;  . I&D EXTREMITY Right 04/09/2019   Procedure: IRRIGATION AND DEBRIDEMENT EXTREMITY R lower leg;  Surgeon: Altamese Ferris, MD;  Location: [facility];  Service: Orthopedics;  Laterality: Right;  . I&D EXTREMITY Right 04/13/2019   Procedure: IRRIGATION AND DEBRIDEMENT EXTREMITY Right Leg;  Surgeon:  Altamese [place], MD;  Location: [facility];  Service: Orthopedics;  Laterality: Right;  . I&D EXTREMITY Left 03/02/2019   Procedure: IRRIGATION AND DEBRIDEMENT LEG;  Surgeon: Altamese [place], MD;  Location: [facility];  Service: Orthopedics;  Laterality: Left;  . ORIF PELVIC FRACTURE WITH PERCUTANEOUS SCREWS Right 03/02/2019   Procedure: Orif Pelvic Fracture With Percutaneous Screws;  Surgeon: Altamese Lyman, MD;  Location: [place];  Service: Orthopedics;  Laterality: Right;  . SKIN SPLIT GRAFT Right 03/26/2019   Procedure: SKIN GRAFT SPLIT THICKNESS;  Surgeon: Altamese Walker, MD;  Location: [place] [facility];  Service: Orthopedics;  Laterality: Right;  . TIBIA IM NAIL INSERTION Left 03/02/2019   Procedure: INTRAMEDULLARY (IM) NAIL TIBIAL;  Surgeon: Altamese , MD;  Location: [facility];  Service: Orthopedics;  Laterality: Left;    There were no vitals filed for this visit.  Subjective Assessment - 08/10/19 1020    Subjective  Was tired after last session, no soreness or pain to report. No falls or pain currently., Stil has not heard from Dr. Marcelino Scot on the MRI of the shoulder. Medicaid is still processing his application. He was told not to file finiancial assistance unitl Medicaid was decided.    Pertinent History  CAD, Hep C, HTN    Patient Stated Goals  To get stronger to get prosthesis, Then when get prosthesis, return to normalicy including work, move when wants, not be dependent on others.    Currently in Pain?  No/denies            [facility] Adult PT Treatment/Exercise - 08/10/19 1022      Transfers   Transfers  Sit to Stand;Stand to Sit    Sit to Stand  3: Mod assist;4: Min assist;With upper extremity assist;From chair/3-in-1;Other (comment)   from Stedy   Stand to Sit  4: Min assist;3: Mod assist;With upper extremity assist;To chair/3-in-1;Other (comment)   to [facility]   Comments  with Stedy assist lift: performed 2 stands with activity, then 10 consecutive stands to work on form/technique with sit<>stands; 1st  stand: stood with min guard assist for 5 minutes with heavy UE reliance with min guard assist for balance, cues/facilitation for posture, 2cd stand- had pt work on right limb hip extension, hip abduction, and then hip flexion for 10 reps each, left single leg mini squats for 10 reps each. all with min guard to min assist for balance/posture.       Neuro Re-ed    Neuro Re-ed Details   for strengthening/balance: seated on green air disc: with dowel rod- UE raises for 10 reps, limited raise due to left shoulder pain/injury, then upper trunk rotation with dowel rod held out straight for 10 reps each way; with green theraband- rows, shoulder extension, and shoulder horizontal abduction for 10 reps AROM with cues on form and technique; left LE long arc quads for 10 reps with light fingertip support, alternating hip flexion for 10 reps each side, and then alternating combo UE/LE (contralateral sides) raises for 10 reps each side, AROM. cues needed to maintain tall posture and for  ex form/technique.             PT Short Term Goals - 08/03/19 1025      PT SHORT TERM GOAL #1   Title  Patient demonstrates understanding of initial HEP. (All STGs Target Date: 08/06/2019)    Baseline  08/03/19: met with current program, will need to update soon    Status  Achieved    Target Date  08/06/19      PT SHORT TERM GOAL #2   Title  Patient sit to /from stand w/c tosink with minA    Baseline  08/03/19: requires 2 person max assist    Time  --    Period  --    Status  Not Met    Target Date  08/06/19      PT SHORT TERM GOAL #3   Title  Patient tolerates standing statically with sink support & stool under right ischuim for 2 minutes with supervision.    Baseline  08/03/19: able to stand without stool in Stedy lift assist for 2-3 minutes with min guard to min assist for balance.    Time  --    Period  --    Status  Partially Met    Target Date  08/06/19      PT SHORT TERM GOAL #4   Title  PT to perform prosthetic  assessment once he receives his prosthesis    Baseline  08/03/19: has not been casted to date.    Status  Deferred    Target Date  08/06/19        PT Long Term Goals - 07/06/19 2303      PT LONG TERM GOAL #1   Title  Patient demonstrates & verbalizes understanding of prosthetic care to enable safe utilization of prosthesis (All LTGs Target Date: 11/24/2019)    Time  20    Period  Weeks    Status  New    Target Date  11/24/19      PT LONG TERM GOAL #2   Title  Patient tolerates wear of prosthesis >80% of awake hours without skin or limb pain issues to enable function throughout his day.    Time  20    Period  Weeks    Status  New    Target Date  11/24/19      PT LONG TERM GOAL #3   Title  Standing Balance with prosthesis Berg Balance >45/56 to indicate lower fall risk.    Time  20    Period  Weeks    Status  New    Target Date  11/24/19      PT LONG TERM GOAL #4   Title  Patient ambulates 300' with LRAD & prosthesis modified independent.    Time  20    Period  Weeks    Status  New    Target Date  11/24/19      PT LONG TERM GOAL #5   Title  Patient negotiates stairs, ramps & curbs with LRAD & prosthesis modified independent.    Time  20    Period  Weeks    Status  New    Target Date  11/24/19            Plan - 08/10/19 1022    Clinical Impression Statement  Today's skilled session continued to address transfers, standing tolerance and overall strengthening with only fatigue reported. Sit<>stands are improving within [facility] assist lift. 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, T8, T12 & L3-5 fractures, Left femur fracture,  left scapula fx, open left tibia fx, pelvic ring fx, CAD, HTN, HepC    Examination-Activity Limitations  Bed  Mobility;Caring for Others;Carry;Hygiene/Grooming;Lift;Locomotion Level;Reach Overhead;Squat;Stairs;Stand;Transfers    Examination-Participation Restrictions  Community Activity;Driving;Meal Prep;Volunteer;Yard Work;Other   work   Stability/Clinical Decision Making  Unstable/Unpredictable    Rehab Potential  Good    PT Frequency  2x / week    PT Duration  Other (comment)   20 weeks, but may change if patient gets Medicaid   PT Treatment/Interventions  ADLs/Self Care Home Management;Aquatic Therapy;Canalith Repostioning;Cryotherapy;Electrical Stimulation;Moist Heat;Ultrasound;DME Instruction;Gait training;Stair training;Functional mobility training;Therapeutic activities;Therapeutic exercise;Balance training;Neuromuscular re-education;Patient/family education;Prosthetic Training;Manual techniques;Scar mobilization;Passive range of motion;Dry needling;Vestibular;Joint Manipulations    PT Next Visit Plan  See if MRI of shoulder has been completed with results. Continue to address strengthening. Have 2 person assist for any standing attempts not using Stedy lift assist. Spouse asking aobut best mattress for pt as they are looking to get a new one. Defer to primary PT on this.    PT Home Exercise Plan  [id number]    Consulted and Agree with Plan of Care  Patient;Family member/caregiver    Family Member Consulted  wife, Glendal Cassaday       Patient will benefit from skilled therapeutic intervention in order to improve the following deficits and impairments:  Abnormal gait, Decreased activity tolerance, Decreased balance, Decreased coordination, Decreased endurance, Decreased knowledge of use of DME, Decreased mobility, Decreased range of motion, Decreased skin integrity, Decreased strength, Difficulty walking, Impaired flexibility, Impaired UE functional use, Postural dysfunction, Prosthetic Dependency  Visit Diagnosis: 1. Muscle weakness (generalized)   2. Unsteadiness on feet   3. Contracture of muscle,  multiple sites   4. Abnormal posture   5. Other symptoms and signs involving the musculoskeletal system        Problem List Patient Active Problem List   Diagnosis Date Noted  . Chronic hepatitis C without hepatic coma ([place]) 04/22/2019  . Osteomyelitis of right tibia ([place]) 04/11/2019  . Open left tibial fracture 03/02/2019  . Unspecified injury of popliteal artery, right leg, initial encounter 03/02/2019  . Popliteal vein injury, right, initial encounter 03/02/2019  . 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 Pelvic ring fracture ([place]), Right  03/02/2019  . Left scapula fracture 03/02/2019  . Right knee dislocation 03/02/2019  . Femur fracture, left ([place]) 02/28/2019    Willow Ora, PTA, [facility] [address] [place], [place] [address] [phone] 08/11/19, 5:11 PM   Name: Travis Benson MRN: [id number] Date of Birth: 1961/01/07

## 2019-08-12 ENCOUNTER — Ambulatory Visit: Payer: Medicaid Other | Admitting: Physical Therapy

## 2019-08-13 ENCOUNTER — Ambulatory Visit: Payer: Self-pay | Admitting: Family Medicine

## 2019-08-17 ENCOUNTER — Ambulatory Visit: Payer: Medicaid Other | Admitting: Physical Therapy

## 2019-08-17 ENCOUNTER — Other Ambulatory Visit: Payer: Self-pay

## 2019-08-17 ENCOUNTER — Encounter: Payer: Self-pay | Admitting: Physical Therapy

## 2019-08-17 DIAGNOSIS — M6281 Muscle weakness (generalized): Secondary | ICD-10-CM | POA: Diagnosis not present

## 2019-08-17 DIAGNOSIS — M6249 Contracture of muscle, multiple sites: Secondary | ICD-10-CM

## 2019-08-17 DIAGNOSIS — R29898 Other symptoms and signs involving the musculoskeletal system: Secondary | ICD-10-CM

## 2019-08-17 DIAGNOSIS — R2681 Unsteadiness on feet: Secondary | ICD-10-CM

## 2019-08-17 DIAGNOSIS — R293 Abnormal posture: Secondary | ICD-10-CM

## 2019-08-18 ENCOUNTER — Other Ambulatory Visit: Payer: Self-pay | Admitting: Orthopedic Surgery

## 2019-08-18 DIAGNOSIS — M25512 Pain in left shoulder: Secondary | ICD-10-CM

## 2019-08-18 NOTE — Therapy (Signed)
[facility] [address] [place], [place], [address] Phone: [phone]   Fax:  [phone]  Physical Therapy Treatment  Patient Details  Name: Travis Benson MRN: [id number] Date of Birth: November 21, 1961 Referring Provider (PT): Cain Saupeammie Fulp, MD   Encounter Date: 08/17/2019     08/17/19 1020  PT Visits / Re-Eval  Visit Number 7  Number of Visits 41 (May change if he receives Medicaid as only 27 visits /year allowed)  Date for PT Re-Evaluation 11/24/19  Authorization  Authorization Type Medicaid pending / Self -pay  Per PT recommendation applying for [facility] Financial Assistance Program  PT Time Calculation  PT Start Time 1017  PT Stop Time 1058  PT Time Calculation (min) 41 min  PT - End of Session  Equipment Utilized During Treatment Gait belt  Activity Tolerance Patient tolerated treatment well;Patient limited by fatigue  Behavior [facility] tasks assessed/performed    Past Medical History:  Diagnosis Date  . Coronary artery disease   . History of hepatitis C   . Hypertension   . 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 Left scapula fracture 03/02/2019  . Open left tibial fracture 03/02/2019  . Pelvic ring fracture (HCC), Right  03/02/2019  . Right knee dislocation 03/02/2019  . Unspecified injury of popliteal artery, right leg, initial encounter 03/02/2019    Past Surgical History:  Procedure Laterality Date  . AMPUTATION Right 04/13/2019   Procedure: AMPUTATION ABOVE KNEE;  Surgeon: Myrene GalasHandy, Michael, MD;  [facility];  Service: Orthopedics;  Laterality: Right;  . APPLICATION OF WOUND VAC Bilateral 03/05/2019   Procedure: WOUND VAC CHANGE RIGHT LOWER LEG; REMOVAL OF WOUND VAC LEFT LOWER LEG WITH APPLICATION OF DRESSINGS;  Surgeon: Larina EarthlyEarly, Todd F, MD;  Location: [facility] OR;  Service: Vascular;  Laterality: Bilateral;  . APPLICATION OF WOUND VAC Right 04/09/2019   Procedure: APPLICATION OF WOUND VAC;   Surgeon: Myrene GalasHandy, Michael, MD;  Location: [facility] OR;  Service: Orthopedics;  Laterality: Right;  . APPLICATION OF WOUND VAC Bilateral 03/02/2019   Procedure: Application Of Wound Vac;  Surgeon: Myrene GalasHandy, Michael, MD;  [facility];  Service: Orthopedics;  Laterality: Bilateral;  . APPLICATION OF WOUND VAC Right 03/02/2019   Procedure: Wound Vac dressing removal;  Surgeon: Maeola Harmanain, Brandon Christopher, MD;  [facility];  Service: Vascular;  Laterality: Right;  . EXTERNAL FIXATION LEG Right 03/02/2019   Procedure: External Fixation Leg;  Surgeon: Myrene GalasHandy, Michael, MD;  [facility];  Service: Orthopedics;  Laterality: Right;  . EXTERNAL FIXATION REMOVAL Left 03/02/2019   Procedure: REMOVAL EXTERNAL FIXATION LEG;  Surgeon: Myrene GalasHandy, Michael, MD;  [facility];  Service: Orthopedics;  Laterality: Left;  . FASCIOTOMY Right 02/28/2019   Procedure: FOUR COMPARTMENT FASCIOTOMY OF RIGHT LOWER LEG;  Surgeon: Maeola Harmanain, Brandon Christopher, MD;  [facility];  Service: Vascular;  Laterality: Right;  . FEMORAL-POPLITEAL BYPASS GRAFT Right 02/28/2019   Procedure: BYPASS GRAFT OF ABOVE KNEE POPLITEAL- BELOW KNEE POPLITEAL ARTERY;  Surgeon: Maeola Harmanain, Brandon Christopher, MD;  [facility];  Service: Vascular;  Laterality: Right;  . FEMUR IM NAIL Left 03/02/2019   Procedure: INTRAMEDULLARY (IM) RETROGRADE FEMORAL NAILING;  Surgeon: Myrene GalasHandy, Michael, MD;  [facility]: [facility] OR;  Service: Orthopedics;  Laterality: Left;  . I&D EXTREMITY Left 02/28/2019   Procedure: IRRIGATION AND DEBRIDEMENT OF LEFT LOWER LEG /INTERNAL FIXATION OF LEFT TIBIA PLACEMENT OF FRACTURE PINLEFT FEMUR/ CLOSED REDUCTION OF LEFT FEMUR.;  Surgeon: Durene Romanslin, Matthew, MD;  [facility];  Service: Orthopedics;  Laterality: Left;  . I&D EXTREMITY Right 04/09/2019   Procedure: IRRIGATION AND DEBRIDEMENT EXTREMITY R lower leg;  Surgeon: Myrene GalasHandy, Michael, MD;  [facility] OR;  Service: Orthopedics;  Laterality: Right;  . I&D EXTREMITY Right 04/13/2019   Procedure: IRRIGATION AND  DEBRIDEMENT EXTREMITY Right Leg;  Surgeon: Myrene GalasHandy, Michael, MD;  [facility];  Service: Orthopedics;  Laterality: Right;  . I&D EXTREMITY Left 03/02/2019   Procedure: IRRIGATION AND DEBRIDEMENT LEG;  Surgeon: Myrene GalasHandy, Michael, MD;  [facility]: [facility] OR;  Service: Orthopedics;  Laterality: Left;  . ORIF PELVIC FRACTURE WITH PERCUTANEOUS SCREWS Right 03/02/2019   Procedure: Orif Pelvic Fracture With Percutaneous Screws;  Surgeon: Myrene GalasHandy, Michael, MD;  [facility]: [facility] OR;  Service: Orthopedics;  Laterality: Right;  . SKIN SPLIT GRAFT Right 03/26/2019   Procedure: SKIN GRAFT SPLIT THICKNESS;  Surgeon: Myrene GalasHandy, Michael, MD;  [facility];  Service: Orthopedics;  Laterality: Right;  . TIBIA IM NAIL INSERTION Left 03/02/2019   Procedure: INTRAMEDULLARY (IM) NAIL TIBIAL;  Surgeon: Myrene GalasHandy, Michael, MD;  Location: [facility] OR;  Service: Orthopedics;  Laterality: Left;    There were no vitals filed for this visit.     08/17/19 1019  Symptoms/Limitations  Subjective No new complaints. No falls or pain to report. Still no word about the MRI for the shoulder.  Patient is accompained by: Family member (mom)  Pertinent History CAD, Hep C, HTN  Patient Stated Goals To get stronger to get prosthesis, Then when get prosthesis, return to normalicy including work, move when wants, not be dependent on others.      08/17/19 1022  Transfers  Transfers Sit to Stand;Stand to Sit  Sit to Stand 4: Min assist;3: Mod assist;2: Max assist;With upper extremity assist;From bed;From chair/3-in-1  Sit to Stand Details Tactile cues for sequencing;Tactile cues for weight shifting;Verbal cues for sequencing;Verbal cues for technique;Verbal cues for safe use of DME/AE;Manual facilitation for weight shifting  Stand to Sit 4: Min assist;3: Mod assist;With upper extremity assist;To bed;To chair/3-in-1  Stand to Sit Details (indicate cue type and reason) Tactile cues for weight shifting;Tactile cues for posture;Verbal cues for sequencing;Verbal cues  for technique;Verbal cues for precautions/safety;Manual facilitation for weight shifting  Comments with Stedy left assist: mod assist to stand initially from low mat into the stedy lift., from the lift pads min assist with cues. standing in the lift on 1st stand pt worked on left knee flexion/extension. cues/facilitation needed to decrease compensatory movements of trunk/hip. on 2cd stand- worked on right LE hip flexion, abduction and extension for 10 reps each with cues/facilitation for posture/ex form; in parallel bars: attempted to stand 2 reps with one person max assist, unable to get right UE straight for support (pt relying on forearm). with 2 person assist obtained full upright standing and held for 3 minutes with one person min to mod assist. mod/max assist to sit to wheelchair each time.         PT Short Term Goals - 08/12/19 1714      PT SHORT TERM GOAL #1   Title  Patient demonstrates understanding of updated HEP. (All STGs Target Date: 09/10/2019)    Time  4    Period  Weeks    Status  On-going    Target Date  09/10/19      PT SHORT TERM GOAL #2   Title  Patient sit to /from stand w/c tosink with minA    Baseline  --    Time  4  Period  Weeks    Status  On-going    Target Date  09/10/19      PT SHORT TERM GOAL #3   Title  Patient tolerates standing statically with sink support & stool under right ischuim for 2 minutes with supervision.    Baseline  --    Time  4    Period  Weeks    Status  On-going    Target Date  09/10/19      PT SHORT TERM GOAL #4   Title  PT to perform prosthetic assessment once he receives his prosthesis    Baseline  --    Status  Deferred    Target Date  09/10/19        PT Long Term Goals - 07/06/19 2303      PT LONG TERM GOAL #1   Title  Patient demonstrates & verbalizes understanding of prosthetic care to enable safe utilization of prosthesis (All LTGs Target Date: 11/24/2019)    Time  20    Period  Weeks    Status  New    Target  Date  11/24/19      PT LONG TERM GOAL #2   Title  Patient tolerates wear of prosthesis >80% of awake hours without skin or limb pain issues to enable function throughout his day.    Time  20    Period  Weeks    Status  New    Target Date  11/24/19      PT LONG TERM GOAL #3   Title  Standing Balance with prosthesis Berg Balance >45/56 to indicate lower fall risk.    Time  20    Period  Weeks    Status  New    Target Date  11/24/19      PT LONG TERM GOAL #4   Title  Patient ambulates 300' with LRAD & prosthesis modified independent.    Time  20    Period  Weeks    Status  New    Target Date  11/24/19      PT LONG TERM GOAL #5   Title  Patient negotiates stairs, ramps & curbs with LRAD & prosthesis modified independent.    Time  20    Period  Weeks    Status  New    Target Date  11/24/19          08/17/19 1020  Plan  Clinical Impression Statement Today's skilled session continued to focus on standing transfers and standing balance. 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 from injury to popliteal artery, T8, T12 & L3-5 fractures, Left femur fracture,  left scapula fx, open left tibia fx, pelvic ring fx, CAD, HTN, HepC  Examination-Activity Limitations Bed Mobility;Caring for Others;Carry;Hygiene/Grooming;Lift;Locomotion Level;Reach Overhead;Squat;Stairs;Stand;Transfers  Examination-Participation Restrictions Community Activity;Driving;Meal Prep;Volunteer;Yard Work;Other (work)  Pt will benefit from skilled therapeutic intervention in order to improve on the following deficits Abnormal gait;Decreased activity tolerance;Decreased balance;Decreased coordination;Decreased endurance;Decreased knowledge of use of  DME;Decreased mobility;Decreased range of motion;Decreased skin integrity;Decreased strength;Difficulty walking;Impaired flexibility;Impaired UE functional use;Postural dysfunction;Prosthetic Dependency  Stability/Clinical Decision Making Unstable/Unpredictable  Rehab Potential Good  PT Frequency 2x / week  PT Duration Other (comment) (20 weeks, but may change if patient gets Medicaid)  PT Treatment/Interventions ADLs/Self Care Home Management;Aquatic Therapy;Canalith Repostioning;Cryotherapy;Electrical Stimulation;Moist Heat;Ultrasound;DME  Instruction;Gait training;Stair training;Functional mobility training;Therapeutic activities;Therapeutic exercise;Balance training;Neuromuscular re-education;Patient/family education;Prosthetic Training;Manual techniques;Scar mobilization;Passive range of motion;Dry needling;Vestibular;Joint Manipulations  PT Next Visit Plan See if MRI of shoulder has been completed with results. Continue to address strengthening. Have 2 person assist for any standing attempts not using Stedy lift assist. Spouse asking aobut best mattress for pt as they are looking to get a new one. Defer to primary PT on this.  PT Home Exercise Plan [id number]  Consulted and Agree with Plan of Care Patient;Family member/caregiver  Family Member Consulted wife, Zyron Deeley         Patient will benefit from skilled therapeutic intervention in order to improve the following deficits and impairments:  Abnormal gait, Decreased activity tolerance, Decreased balance, Decreased coordination, Decreased endurance, Decreased knowledge of use of DME, Decreased mobility, Decreased range of motion, Decreased skin integrity, Decreased strength, Difficulty walking, Impaired flexibility, Impaired UE functional use, Postural dysfunction, Prosthetic Dependency  Visit Diagnosis: 1. Muscle weakness (generalized)   2. Unsteadiness on feet   3. Contracture of muscle, multiple sites   4. Abnormal posture   5.  Other symptoms and signs involving the musculoskeletal system        Problem List Patient Active Problem List   Diagnosis Date Noted  . Chronic hepatitis C without hepatic coma ([place]) 04/22/2019  . Osteomyelitis of right tibia ([place]) 04/11/2019  . Open left tibial fracture 03/02/2019  . Unspecified injury of popliteal artery, right leg, initial encounter 03/02/2019  . Popliteal vein injury, right, initial encounter 03/02/2019  . 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 Pelvic ring fracture ([place]), Right  03/02/2019  . Left scapula fracture 03/02/2019  . Right knee dislocation 03/02/2019  . Femur fracture, left ([place]) 02/28/2019    Willow Ora, PTA, [place] [address] [place], [place] [address] [phone] 08/18/19, 10:29 PM   Name: Travis Benson MRN: [id number] Date of Birth: 1961/10/28

## 2019-08-19 ENCOUNTER — Ambulatory Visit: Payer: Medicaid Other | Admitting: Physical Therapy

## 2019-08-19 ENCOUNTER — Encounter: Payer: Self-pay | Admitting: Physical Therapy

## 2019-08-19 ENCOUNTER — Other Ambulatory Visit: Payer: Self-pay

## 2019-08-19 DIAGNOSIS — R29898 Other symptoms and signs involving the musculoskeletal system: Secondary | ICD-10-CM

## 2019-08-19 DIAGNOSIS — M25612 Stiffness of left shoulder, not elsewhere classified: Secondary | ICD-10-CM

## 2019-08-19 DIAGNOSIS — R293 Abnormal posture: Secondary | ICD-10-CM

## 2019-08-19 DIAGNOSIS — M6281 Muscle weakness (generalized): Secondary | ICD-10-CM

## 2019-08-19 DIAGNOSIS — S85501A Unspecified injury of popliteal vein, right leg, initial encounter: Secondary | ICD-10-CM

## 2019-08-19 DIAGNOSIS — M6249 Contracture of muscle, multiple sites: Secondary | ICD-10-CM

## 2019-08-19 DIAGNOSIS — R2681 Unsteadiness on feet: Secondary | ICD-10-CM

## 2019-08-19 DIAGNOSIS — M25672 Stiffness of left ankle, not elsewhere classified: Secondary | ICD-10-CM

## 2019-08-19 DIAGNOSIS — I739 Peripheral vascular disease, unspecified: Secondary | ICD-10-CM

## 2019-08-19 NOTE — Therapy (Signed)
[facility] [address] [place], [place], [address] Phone: [phone]   Fax:  [phone]  Physical Therapy Treatment  Patient Details  Name: Travis Benson MRN: [id number] Date of Birth: May 11, 1961 Referring Provider (PT): Cain Saupeammie Fulp, MD   Encounter Date: 08/19/2019   CLINIC OPERATION CHANGES: Outpatient Neuro Rehab is open at lower capacity following universal masking, social distancing, and patient screening.  The patient's COVID risk of complications score is 2.   PT End of Session - 08/19/19 1254    Visit Number  8    Number of Visits  41   May change if he receives Medicaid as only 27 visits /year allowed   Date for PT Re-Evaluation  11/24/19    Authorization Type  Medicaid pending / Self -pay  Per PT recommendation applying for [facility] Financial Assistance Program    PT Start Time  1015    PT Stop Time  1053    PT Time Calculation (min)  38 min    Equipment Utilized During Treatment  Gait belt    Activity Tolerance  Patient tolerated treatment well;Patient limited by fatigue    Behavior During Therapy  WFL for tasks assessed/performed       Past Medical History:  Diagnosis Date  . Coronary artery disease   . History of hepatitis C   . Hypertension   . 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 Left scapula fracture 03/02/2019  . Open left tibial fracture 03/02/2019  . Pelvic ring fracture (HCC), Right  03/02/2019  . Right knee dislocation 03/02/2019  . Unspecified injury of popliteal artery, right leg, initial encounter 03/02/2019    Past Surgical History:  Procedure Laterality Date  . AMPUTATION Right 04/13/2019   Procedure: AMPUTATION ABOVE KNEE;  Surgeon: Myrene GalasHandy, Michael, MD;  [facility];  Service: Orthopedics;  Laterality: Right;  . APPLICATION OF WOUND VAC Bilateral 03/05/2019   Procedure: WOUND VAC CHANGE RIGHT LOWER LEG; REMOVAL OF WOUND VAC LEFT LOWER LEG WITH APPLICATION OF DRESSINGS;  Surgeon:  Larina EarthlyEarly, Todd F, MD;  [facility]: [facility] OR;  Service: Vascular;  Laterality: Bilateral;  . APPLICATION OF WOUND VAC Right 04/09/2019   Procedure: APPLICATION OF WOUND VAC;  Surgeon: Myrene GalasHandy, Michael, MD;  Location[facility] OR;  Service: Orthopedics;  Laterality: Right;  . APPLICATION OF WOUND VAC Bilateral 03/02/2019   Procedure: Application Of Wound Vac;  Surgeon: Myrene GalasHandy, Michael, MD;  [facility];  Service: Orthopedics;  Laterality: Bilateral;  . APPLICATION OF WOUND VAC Right 03/02/2019   Procedure: Wound Vac dressing removal;  Surgeon: Maeola Harmanain, Brandon Christopher, MD;  [facility];  Service: Vascular;  Laterality: Right;  . EXTERNAL FIXATION LEG Right 03/02/2019   Procedure: External Fixation Leg;  Surgeon: Myrene GalasHandy, Michael, MD;  [facility];  Service: Orthopedics;  Laterality: Right;  . EXTERNAL FIXATION REMOVAL Left 03/02/2019   Procedure: REMOVAL EXTERNAL FIXATION LEG;  Surgeon: Myrene GalasHandy, Michael, MD;  [facility];  Service: Orthopedics;  Laterality: Left;  . FASCIOTOMY Right 02/28/2019   Procedure: FOUR COMPARTMENT FASCIOTOMY OF RIGHT LOWER LEG;  Surgeon: Maeola Harmanain, Brandon Christopher, MD;  [facility];  Service: Vascular;  Laterality: Right;  . FEMORAL-POPLITEAL BYPASS GRAFT Right 02/28/2019   Procedure: BYPASS GRAFT OF ABOVE KNEE POPLITEAL- BELOW KNEE POPLITEAL ARTERY;  Surgeon: Maeola Harmanain, Brandon Christopher, MD;  [facility];  Service: Vascular;  Laterality: Right;  . FEMUR IM NAIL Left 03/02/2019   Procedure: INTRAMEDULLARY (IM) RETROGRADE FEMORAL NAILING;  Surgeon: Carola FrostHandy,  Legrand Como, MD;  Location: [place];  Service: Orthopedics;  Laterality: Left;  . I&D EXTREMITY Left 02/28/2019   Procedure: IRRIGATION AND DEBRIDEMENT OF LEFT LOWER LEG /INTERNAL FIXATION OF LEFT TIBIA PLACEMENT OF FRACTURE PINLEFT FEMUR/ CLOSED REDUCTION OF LEFT FEMUR.;  Surgeon: Paralee Cancel, MD;  Location: [place];  Service: Orthopedics;  Laterality: Left;  . I&D EXTREMITY Right 04/09/2019   Procedure: IRRIGATION AND DEBRIDEMENT EXTREMITY R  lower leg;  Surgeon: Altamese Uniopolis, MD;  Location: [place];  Service: Orthopedics;  Laterality: Right;  . I&D EXTREMITY Right 04/13/2019   Procedure: IRRIGATION AND DEBRIDEMENT EXTREMITY Right Leg;  Surgeon: Altamese Vineyards, MD;  Location: [place];  Service: Orthopedics;  Laterality: Right;  . I&D EXTREMITY Left 03/02/2019   Procedure: IRRIGATION AND DEBRIDEMENT LEG;  Surgeon: Altamese Lowrys, MD;  Location: [place];  Service: Orthopedics;  Laterality: Left;  . ORIF PELVIC FRACTURE WITH PERCUTANEOUS SCREWS Right 03/02/2019   Procedure: Orif Pelvic Fracture With Percutaneous Screws;  Surgeon: Altamese Nescatunga, MD;  Location: [place];  Service: Orthopedics;  Laterality: Right;  . SKIN SPLIT GRAFT Right 03/26/2019   Procedure: SKIN GRAFT SPLIT THICKNESS;  Surgeon: Altamese Sellersville, MD;  Location: [place];  Service: Orthopedics;  Laterality: Right;  . TIBIA IM NAIL INSERTION Left 03/02/2019   Procedure: INTRAMEDULLARY (IM) NAIL TIBIAL;  Surgeon: Altamese Nogal, MD;  Location: [place];  Service: Orthopedics;  Laterality: Left;    There were no vitals filed for this visit.  Subjective Assessment - 08/19/19 1015    Subjective  No falls. He has been doing HEP. His wife and dtr help lift him from landing to w/c once scoots up steps on buttocks.    Patient is accompained by:  Family member   mom   Pertinent History  CAD, Hep C, HTN    Patient Stated Goals  To get stronger to get prosthesis, Then when get prosthesis, return to normalicy including work, move when wants, not be dependent on others.    Currently in Pain?  No/denies                       [facility] Adult PT Treatment/Exercise - 08/19/19 1015      Transfers   Transfers  Sit to Stand;Stand to Sit    Sit to Stand  2: Max assist;With upper extremity assist;From chair/3-in-1;From elevated surface   from 24" ht w/c to platform RW   Sit to Stand Details  Tactile cues for sequencing;Tactile cues for weight shifting;Verbal cues for sequencing;Verbal cues  for technique;Verbal cues for safe use of DME/AE;Manual facilitation for weight shifting    Stand to Sit  3: Mod assist;With upper extremity assist;To chair/3-in-1;With armrests;To elevated surface   platform RW to 24" chair   Stand to Sit Details (indicate cue type and reason)  Tactile cues for weight shifting;Tactile cues for posture;Verbal cues for sequencing;Verbal cues for technique;Verbal cues for precautions/safety;Manual facilitation for weight shifting      Knee/Hip Exercises: Aerobic   Nustep  Level 5 with BUEs & LLE for 11 minutes               PT Short Term Goals - 08/19/19 1744      PT SHORT TERM GOAL #1   Title  Patient demonstrates understanding of updated HEP. (All STGs Target Date: 09/10/2019)    Time  4    Period  Weeks    Status  On-going    Target Date  09/10/19  PT SHORT TERM GOAL #2   Title  Patient sit to /from stand 24" w/c to platform RW with modA    Time  4    Period  Weeks    Status  Revised    Target Date  09/10/19      PT SHORT TERM GOAL #3   Title  Patient tolerates standing statically with platform RW support for 3 minutes with minimal guard.    Time  4    Period  Weeks    Status  Revised    Target Date  09/10/19        PT Long Term Goals - 07/06/19 2303      PT LONG TERM GOAL #1   Title  Patient demonstrates & verbalizes understanding of prosthetic care to enable safe utilization of prosthesis (All LTGs Target Date: 11/24/2019)    Time  20    Period  Weeks    Status  New    Target Date  11/24/19      PT LONG TERM GOAL #2   Title  Patient tolerates wear of prosthesis >80% of awake hours without skin or limb pain issues to enable function throughout his day.    Time  20    Period  Weeks    Status  New    Target Date  11/24/19      PT LONG TERM GOAL #3   Title  Standing Balance with prosthesis Berg Balance >45/56 to indicate lower fall risk.    Time  20    Period  Weeks    Status  New    Target Date  11/24/19      PT  LONG TERM GOAL #4   Title  Patient ambulates 300' with LRAD & prosthesis modified independent.    Time  20    Period  Weeks    Status  New    Target Date  11/24/19      PT LONG TERM GOAL #5   Title  Patient negotiates stairs, ramps & curbs with LRAD & prosthesis modified independent.    Time  20    Period  Weeks    Status  New    Target Date  11/24/19            Plan - 08/19/19 1746    Clinical Impression Statement  Patient improved ability to stand from 24" w/c to platform RW. The platform appeared to improve issues with inability to extend RUE  on RW or bars.    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, T8, T12 & L3-5 fractures, Left femur fracture,  left scapula fx, open left tibia fx, pelvic ring fx, CAD, HTN, HepC    Examination-Activity Limitations  Bed Mobility;Caring for Others;Carry;Hygiene/Grooming;Lift;Locomotion Level;Reach Overhead;Squat;Stairs;Stand;Transfers    Examination-Participation Restrictions  Community Activity;Driving;Meal Prep;Volunteer;Yard Work;Other   work   Stability/Clinical Decision Making  Unstable/Unpredictable    Rehab Potential  Good    PT Frequency  2x / week    PT Duration  Other (comment)   20 weeks, but may change if patient gets Medicaid   PT Treatment/Interventions  ADLs/Self Care Home Management;Aquatic Therapy;Canalith Repostioning;Cryotherapy;Electrical Stimulation;Moist Heat;Ultrasound;DME Instruction;Gait training;Stair training;Functional mobility training;Therapeutic activities;Therapeutic exercise;Balance training;Neuromuscular re-education;Patient/family education;Prosthetic Training;Manual techniques;Scar mobilization;Passive range of motion;Dry needling;Vestibular;Joint Manipulations    PT Next Visit Plan  See if MRI  of  shoulder has been completed with results. Continue to address strengthening. Have 2 person assist for any standing attempts not using Stedy lift assist. Spouse asking aobut best mattress for pt as they are looking to get a new one. Defer to primary PT on this.    PT Home Exercise Plan  [id number]    Consulted and Agree with Plan of Care  Patient;Family member/caregiver    Family Member Consulted  wife, Daron OfferSusan Condon       Patient will benefit from skilled therapeutic intervention in order to improve the following deficits and impairments:  Abnormal gait, Decreased activity tolerance, Decreased balance, Decreased coordination, Decreased endurance, Decreased knowledge of use of DME, Decreased mobility, Decreased range of motion, Decreased skin integrity, Decreased strength, Difficulty walking, Impaired flexibility, Impaired UE functional use, Postural dysfunction, Prosthetic Dependency  Visit Diagnosis: Muscle weakness (generalized)  Unsteadiness on feet  Contracture of muscle, multiple sites  Abnormal posture  Other symptoms and signs involving the musculoskeletal system  Stiffness of left shoulder, not elsewhere classified  Stiffness of left ankle, not elsewhere classified     Problem List Patient Active Problem List   Diagnosis Date Noted  . Chronic hepatitis C without hepatic coma (HCC) 04/22/2019  . Osteomyelitis of right tibia (HCC) 04/11/2019  . Open left tibial fracture 03/02/2019  . Unspecified injury of popliteal artery, right leg, initial encounter 03/02/2019  . Popliteal vein injury, right, initial encounter 03/02/2019  . 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 Pelvic ring fracture (HCC), Right  03/02/2019  . Left scapula fracture 03/02/2019  . Right knee dislocation 03/02/2019  . Femur fracture, left (HCC) 02/28/2019    Hamlet Lasecki PT, DPT 08/19/2019, 5:49 PM  [facility] [address] [place], [place], [address] Phone: [phone]   Fax:  [phone]  Name: Travis Benson MRN: [id number] Date of Birth: January 19, 1961

## 2019-08-23 ENCOUNTER — Telehealth: Payer: Self-pay | Admitting: Infectious Diseases

## 2019-08-23 NOTE — Telephone Encounter (Signed)
COVID-19 Pre-Screening Questions: ° °Do you currently have a fever (>100 °F), chills or unexplained body aches? N ° °Are you currently experiencing new cough, shortness of breath, sore throat, runny nose? N °•  °Have you recently travelled outside the state of [place] in the last 14 days? N  °•  °Have you been in contact with someone that is currently pending confirmation of Covid19 testing or has been confirmed to have the Covid19 virus?  N ° °**If the patient answers NO to ALL questions -  advise the patient to please call the clinic before coming to the office should any symptoms develop.  ° ° ° °

## 2019-08-24 ENCOUNTER — Encounter: Payer: Self-pay | Admitting: Infectious Diseases

## 2019-08-24 ENCOUNTER — Ambulatory Visit: Payer: Medicaid Other | Admitting: Physical Therapy

## 2019-08-24 ENCOUNTER — Other Ambulatory Visit: Payer: Self-pay

## 2019-08-24 ENCOUNTER — Ambulatory Visit (INDEPENDENT_AMBULATORY_CARE_PROVIDER_SITE_OTHER): Payer: Self-pay | Admitting: Infectious Diseases

## 2019-08-24 ENCOUNTER — Encounter: Payer: Self-pay | Admitting: Physical Therapy

## 2019-08-24 ENCOUNTER — Telehealth: Payer: Self-pay | Admitting: Pharmacy Technician

## 2019-08-24 VITALS — BP 132/76 | HR 71 | Temp 98.5°F

## 2019-08-24 DIAGNOSIS — B182 Chronic viral hepatitis C: Secondary | ICD-10-CM

## 2019-08-24 DIAGNOSIS — M6281 Muscle weakness (generalized): Secondary | ICD-10-CM | POA: Diagnosis not present

## 2019-08-24 DIAGNOSIS — R2689 Other abnormalities of gait and mobility: Secondary | ICD-10-CM

## 2019-08-24 DIAGNOSIS — R29898 Other symptoms and signs involving the musculoskeletal system: Secondary | ICD-10-CM

## 2019-08-24 DIAGNOSIS — R293 Abnormal posture: Secondary | ICD-10-CM

## 2019-08-24 DIAGNOSIS — M6249 Contracture of muscle, multiple sites: Secondary | ICD-10-CM

## 2019-08-24 DIAGNOSIS — R2681 Unsteadiness on feet: Secondary | ICD-10-CM

## 2019-08-24 NOTE — Assessment & Plan Note (Signed)
New Patient with Chronic Hepatitis C genotype 1a, treatment naive. Previously identified to be at no fibrosis risk from liver biopsy 10 yrs ago. Current APRI / FIB4 from hospital stay also suggest this.   I discussed with the patient the lab findings that confirm chronic hepatitis C as well as the natural history and progression of disease including about 30% of people who develop cirrhosis of the liver if left untreated and once cirrhosis is established there is a 2-7% risk per year of liver cancer and liver failure.  I discussed the importance of treatment and benefits in reducing the risk, even if significant liver fibrosis exists. I also discussed risk for re-infection following treatment should he not continue to modify risk factors.    Patient counseled extensively on limiting acetaminophen to no more than 2 grams daily, avoidance of alcohol.  Transmission discussed with patient including sexual transmission, sharing razors and toothbrush.   Will need referral to gastroenterology if concern for cirrhosis  Will prescribe appropriate medication based on genotype and coverage   Hepatitis A and B titers to be drawn today with appropriate vaccinations as needed   Pneumovax vaccine information discussed/provided - will consider at upcoming appt with pharmacy team.   Further work up to include liver staging through non-invasive serum analysis with APRI and FIB4 scores and Liver Fibrosis panel; U/S to follow if discordant or concerning results.  Will call Mykle Pascua back once all results are in and counsel on medication over the phone. He will return 4 weeks after starting to meet with pharmacy team and check RNA at that time.

## 2019-08-24 NOTE — Telephone Encounter (Signed)
[facility] Patient Advocate Encounter    Findings of the benefits investigation conducted this morning via test claims for the patient's upcoming appointment on 08/24/2019 are as follows:   Insurance: no insurance coverage found, no record of [place] Medicaid  Left voicemail with patient to confirm this status. Will have the patient sign and complete the uninsured HepC applications at the time of their appointment.
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
## 2019-08-24 NOTE — Patient Instructions (Signed)
Nice to meet you today!    We need to get a little more information about your hepatitis c infection before we start your treatment. I anticipate that we can get you started in a few weeks after we submit approval to your insurance to ensure payment. We may need to place referral for an ultrasound and/or gastroenterology if your blood work indicates more damage to the liver than expected.   For your wife - please ask your doctor to run a Hepatitis C Antibody test - this is a good screening test to see if you have hepatitis c as well. Until we get your husband treated please use condoms.     ABOUT HEPATITIS C VIRUS:   Chronic Hepatitis C is the most common blood-borne infection in the [place], affecting approximately 3 million people.   It is the leading cause of cirrhosis, liver cancer, and end stage liver disease requiring transplantation when this infection goes untreated for many years   The majority of people who are infected are unaware because there are not many early symptoms that are specific to this and often go undiagnosed until a specific blood test is drawn.    The hepatitis c virus is passed primarily through direct exposure of contaminated blood or body fluids. It is most efficiently transmitted through repeated exposure to infected blood.   Risk for sexual transmission is very low but is possible if there is high frequency of unprotected sexual activity with known hepatitis c partner or multiple partners of known status.   Over time, approximately 60-70% of people can develop some degree of liver disease. Cirrhosis occurs in 10-20% of those with chronic infection. 1-5% will get liver cancer, which has a very high rate of death.    Approximately 15-25% clear the infection without medication (usually in the first 6 months of becoming exposed to virus)   Newer medications provide over 95% cure rate when taken as prescribed    IN GENERAL ABOUT DIET  . Persons  living with chronic hepatitis c infection should eat a diet to maintain a healthy weight and avoid nutritional deficiencies.   . Completely avoiding alcohol is the best decision for your liver health. If unable to do so please limit alcohol to as little as possible to less than 1 standard drink a day - this is very irritating to your liver.  . Limit tylenol use to less than 2,000 mg daily (two extra strength tablets only twice a day)  . Patients with liver conditions should not have protein restriction; we recommend a protein intake of approximately 1.2-1.5 g/kiligram/day.     UNTIL YOU HAVE BEEN TREATED AND CURED:  . Use condoms with all sexual encounters or practice abstinence to avoid sexual transmission   . No sharing of razors, toothbrushes, nail clippers or anything that could potentially have blood on it.   . If you cut yourself please clean and cover any wounds or open sores to others do not come into contact with your blood.   . If blood spills onto item/surface please clean with 1:10 bleach solution and allow to dry, EVEN if it is dried blood.    GENERAL HELPFUL HINTS ON HCV THERAPY:  1. Stay well-hydrated.  2. Notify the [facility] of any changes in your other over-the-counter/herbal or prescription medications.  3. If you miss a dose of your medication, take the missed dose as soon as you remember. Return to your regular time/dose schedule the next day.  4.  Do not stop taking your medications without first talking with your healthcare provider.  5.  You will see our pharmacist-specialist within the first 2 weeks of starting your medication to monitor for any possible side effects.  6.  You will have blood work once during treatment 4 weeks after your first pill. Again soon after treatment is completed and one final lab 3 months after your last pill to ensure cure!   TIPS TO BE SUCCESSFUL WITH DAILY MEDICATION USE:  1. Set a reminder on your phone  2. Try filling  out a pill box for the week - pick a day and put one pill for every day during the week so you know right away if you missed a pill.   3. Have a trusted family member ask you about your medications.   4. Smartphone app    Medication we would like to use for you will be : Harvoni    Harvoni Instructions:  1. Take Harvoni, one tablet daily with or without food. You should take it at approximately the same time every day. Treatment will be determined based on today's blood work - either 8 or 12 weeks}. Do not miss a dose.    2. Do not run out of Harvoni! If you are down to one week of medication left and have not heard about your next shipment, please let us know as soon as possible. Please store at room temperature.   3. If you need to start a new medication, prescription from your doctor or over the counter medication, you need to contact us to make sure it does not interfere with Harvoni. There are several medications that can interfere with Harvoni and can make you sick or make the medication not work.  4. If you need to take a medication for acid reflux, your choices are as follows: o TUMS or Rolaids separated at least four hours from ParklineHarvoni.  o Pepcid (famotidine) 40mg  up to twice per day administered with Harvoni or 12 hours apart from Harvoni.  o Omeprazole 20mg  with Harvoni on EMPTY stomach.   5. Tylenol (acetominophen) and Advil (ibuprofen) are safe to take with Harvoni if needed for headache, fever, pain.   6. DO NOT stop Harvoni unless instructed to by your provider. If you are hospitalized while taking this medication please bring it with you to the hospital to avoid interruption of therapy. Every pill is important!  7. The most common side effects associated with Harvoni include:  o Fatigue o Headache o Nausea o Diarrhea o Insomnia

## 2019-08-25 ENCOUNTER — Ambulatory Visit (HOSPITAL_COMMUNITY): Payer: Self-pay

## 2019-08-25 ENCOUNTER — Encounter (HOSPITAL_COMMUNITY): Payer: Self-pay

## 2019-08-25 ENCOUNTER — Ambulatory Visit: Payer: Self-pay | Admitting: Family

## 2019-08-26 ENCOUNTER — Ambulatory Visit: Payer: Medicaid Other | Admitting: Physical Therapy

## 2019-08-26 ENCOUNTER — Other Ambulatory Visit: Payer: Self-pay

## 2019-08-26 ENCOUNTER — Encounter: Payer: Self-pay | Admitting: Physical Therapy

## 2019-08-26 DIAGNOSIS — M6281 Muscle weakness (generalized): Secondary | ICD-10-CM

## 2019-08-26 DIAGNOSIS — M6249 Contracture of muscle, multiple sites: Secondary | ICD-10-CM

## 2019-08-26 DIAGNOSIS — R2689 Other abnormalities of gait and mobility: Secondary | ICD-10-CM

## 2019-08-26 DIAGNOSIS — R293 Abnormal posture: Secondary | ICD-10-CM

## 2019-08-26 DIAGNOSIS — R2681 Unsteadiness on feet: Secondary | ICD-10-CM

## 2019-08-26 DIAGNOSIS — R29898 Other symptoms and signs involving the musculoskeletal system: Secondary | ICD-10-CM
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
## 2019-08-28 LAB — LIVER FIBROSIS, FIBROTEST-ACTITEST
ALT: 38 U/L (ref 9–46)
Alpha-2-Macroglobulin: 352 mg/dL — ABNORMAL HIGH (ref 106–279)
Apolipoprotein A1: 150 mg/dL (ref 94–176)
Bilirubin: 0.4 mg/dL (ref 0.2–1.2)
Fibrosis Score: 0.5
GGT: 52 U/L (ref 3–85)
Haptoglobin: 170 mg/dL (ref 43–212)
Necroinflammat ACT Score: 0.26
Reference ID: 3058410

## 2019-08-28 LAB — COMPREHENSIVE METABOLIC PANEL
AG Ratio: 1 (calc) (ref 1.0–2.5)
ALT: 41 U/L (ref 9–46)
AST: 39 U/L — ABNORMAL HIGH (ref 10–35)
Albumin: 4 g/dL (ref 3.6–5.1)
Alkaline phosphatase (APISO): 143 U/L (ref 35–144)
BUN: 16 mg/dL (ref 7–25)
CO2: 24 mmol/L (ref 20–32)
Calcium: 9.8 mg/dL (ref 8.6–10.3)
Chloride: 106 mmol/L (ref 98–110)
Creat: 0.96 mg/dL (ref 0.70–1.33)
Globulin: 4.1 g/dL (calc) — ABNORMAL HIGH (ref 1.9–3.7)
Glucose, Bld: 95 mg/dL (ref 65–99)
Potassium: 4.2 mmol/L (ref 3.5–5.3)
Sodium: 140 mmol/L (ref 135–146)
Total Bilirubin: 0.3 mg/dL (ref 0.2–1.2)
Total Protein: 8.1 g/dL (ref 6.1–8.1)

## 2019-08-28 LAB — CBC
HCT: 43.8 % (ref 38.5–50.0)
Hemoglobin: 14.6 g/dL (ref 13.2–17.1)
MCH: 29.1 pg (ref 27.0–33.0)
MCHC: 33.3 g/dL (ref 32.0–36.0)
MCV: 87.3 fL (ref 80.0–100.0)
MPV: 9 fL (ref 7.5–12.5)
Platelets: 534 10*3/uL — ABNORMAL HIGH (ref 140–400)
RBC: 5.02 10*6/uL (ref 4.20–5.80)
RDW: 13.5 % (ref 11.0–15.0)
WBC: 5.1 10*3/uL (ref 3.8–10.8)

## 2019-08-28 LAB — HEPATITIS A ANTIBODY, TOTAL: Hepatitis A AB,Total: REACTIVE — AB

## 2019-08-28 LAB — HEPATITIS C RNA QUANTITATIVE
HCV Quantitative Log: 6.4 Log IU/mL — ABNORMAL HIGH
HCV RNA, PCR, QN: 2540000 IU/mL — ABNORMAL HIGH

## 2019-08-28 LAB — HEPATITIS B SURFACE ANTIBODY,QUALITATIVE: Hep B S Ab: NONREACTIVE

## 2019-08-28 LAB — HEPATITIS B CORE ANTIBODY, TOTAL: Hep B Core Total Ab: REACTIVE — AB

## 2019-08-30 ENCOUNTER — Telehealth (INDEPENDENT_AMBULATORY_CARE_PROVIDER_SITE_OTHER): Payer: Self-pay | Admitting: Family Medicine

## 2019-08-30 DIAGNOSIS — Z89611 Acquired absence of right leg above knee: Secondary | ICD-10-CM

## 2019-08-30 DIAGNOSIS — M792 Neuralgia and neuritis, unspecified: Secondary | ICD-10-CM

## 2019-08-30 DIAGNOSIS — G546 Phantom limb syndrome with pain: Secondary | ICD-10-CM

## 2019-08-30 DIAGNOSIS — I10 Essential (primary) hypertension: Secondary | ICD-10-CM

## 2019-08-30 MED ORDER — GABAPENTIN 300 MG PO CAPS: 300.0000 mg | ORAL_CAPSULE | Freq: Three times a day (TID) | ORAL | 5 refills | Status: DC

## 2019-08-30 MED ORDER — GABAPENTIN 300 MG PO CAPS: ORAL_CAPSULE | ORAL | 5 refills | Status: DC

## 2019-08-30 MED ORDER — AMLODIPINE BESYLATE 5 MG PO TABS: 5.0000 mg | ORAL_TABLET | Freq: Every day | ORAL | 5 refills | Status: DC

## 2019-08-30 MED FILL — GABAPENTIN 300 MG CAPSULE: 300 | 30 days supply | Qty: 120 | Fill #0
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
## 2019-08-31 ENCOUNTER — Ambulatory Visit: Payer: Medicaid Other | Attending: Family Medicine | Admitting: Physical Therapy

## 2019-08-31 ENCOUNTER — Other Ambulatory Visit: Payer: Self-pay

## 2019-08-31 ENCOUNTER — Encounter: Payer: Self-pay | Admitting: Physical Therapy

## 2019-08-31 DIAGNOSIS — M6281 Muscle weakness (generalized): Secondary | ICD-10-CM | POA: Diagnosis present

## 2019-08-31 DIAGNOSIS — M6249 Contracture of muscle, multiple sites: Secondary | ICD-10-CM | POA: Insufficient documentation

## 2019-08-31 DIAGNOSIS — R293 Abnormal posture: Secondary | ICD-10-CM | POA: Insufficient documentation

## 2019-08-31 DIAGNOSIS — R2681 Unsteadiness on feet: Secondary | ICD-10-CM | POA: Diagnosis present

## 2019-08-31 DIAGNOSIS — R2689 Other abnormalities of gait and mobility: Secondary | ICD-10-CM | POA: Diagnosis present

## 2019-08-31 DIAGNOSIS — M25672 Stiffness of left ankle, not elsewhere classified: Secondary | ICD-10-CM | POA: Diagnosis present

## 2019-08-31 DIAGNOSIS — R29898 Other symptoms and signs involving the musculoskeletal system: Secondary | ICD-10-CM | POA: Diagnosis present

## 2019-08-31 DIAGNOSIS — M25612 Stiffness of left shoulder, not elsewhere classified: Secondary | ICD-10-CM | POA: Insufficient documentation

## 2019-08-31 DIAGNOSIS — M25652 Stiffness of left hip, not elsewhere classified: Secondary | ICD-10-CM | POA: Insufficient documentation
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
## 2019-08-31 NOTE — Telephone Encounter (Signed)
Met with the patient at his appointment and completed both applications. Will submit once a medication determination has been made.

## 2019-09-02 ENCOUNTER — Other Ambulatory Visit: Payer: Self-pay

## 2019-09-02 ENCOUNTER — Ambulatory Visit: Payer: Medicaid Other | Admitting: Physical Therapy

## 2019-09-02 ENCOUNTER — Encounter: Payer: Self-pay | Admitting: Physical Therapy

## 2019-09-02 DIAGNOSIS — M6281 Muscle weakness (generalized): Secondary | ICD-10-CM

## 2019-09-02 DIAGNOSIS — R2681 Unsteadiness on feet: Secondary | ICD-10-CM

## 2019-09-02 DIAGNOSIS — M25652 Stiffness of left hip, not elsewhere classified: Secondary | ICD-10-CM

## 2019-09-02 DIAGNOSIS — M6249 Contracture of muscle, multiple sites: Secondary | ICD-10-CM

## 2019-09-02 DIAGNOSIS — R29898 Other symptoms and signs involving the musculoskeletal system: Secondary | ICD-10-CM

## 2019-09-02 DIAGNOSIS — R293 Abnormal posture: Secondary | ICD-10-CM

## 2019-09-02 DIAGNOSIS — R2689 Other abnormalities of gait and mobility: Secondary | ICD-10-CM

## 2019-09-02 DIAGNOSIS — M25672 Stiffness of left ankle, not elsewhere classified: Secondary | ICD-10-CM

## 2019-09-02 DIAGNOSIS — M25612 Stiffness of left shoulder, not elsewhere classified: Secondary | ICD-10-CM

## 2019-09-02 MED FILL — ?AMLODIPINE BESYLATE 5MG TA: 5 | 30 days supply | Qty: 30 | Fill #0
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
## 2019-09-02 NOTE — Patient Instructions (Signed)
Long discussion with demo with patient and his wife on issues with using standard walker.  1.His right arm will not extend or push him up with assistance. His wife nor PT can assist his sit to stand and manage his right arm. Plus if his elbow unlocks then he would suddenly collapse with high potential to fall hurting himself and wife or PT.   2.He has difficulty advancing a rolling walker that he only has to slide forward. Lifting & moving  a standard walker forward would be more difficult for strength & balance reasons.  3.Putting himself at risk of a fall from a standing position could jeopardize progress thus far. He is ready to begin prosthetic training now. We are waiting on his Medicaid approval. Then prosthetist will submit for approval and deliver once Medicaid gives approval. If he were to fall & injure himself, he may not be able to precede with prosthetic training until he heals.  Medicaid will pay for appropriate assistive device. So he needs to be patient until Medicaid is approved. PT recommended to continue to call local & state departments until he gets an answer on his Medicaid.  PT also demo & instructed on youth vs standard height vs tall adult height. He will need a tall adult rolling walker with 5" wheels & right platform attachment.  PT demo & instructed with wife (she is 3" shorter than Travis Benson) how to determine proper height.   Patient & wife verbalized understanding of all of above.

## 2019-09-03 ENCOUNTER — Ambulatory Visit: Payer: Self-pay | Admitting: Family Medicine

## 2019-09-03 ENCOUNTER — Other Ambulatory Visit: Payer: Self-pay

## 2019-09-03 DIAGNOSIS — S85501A Unspecified injury of popliteal vein, right leg, initial encounter: Secondary | ICD-10-CM

## 2019-09-03 DIAGNOSIS — I739 Peripheral vascular disease, unspecified: Secondary | ICD-10-CM

## 2019-09-05 ENCOUNTER — Encounter: Payer: Self-pay | Admitting: Family Medicine

## 2019-09-07 ENCOUNTER — Ambulatory Visit: Payer: Medicaid Other | Admitting: Physical Therapy

## 2019-09-07 ENCOUNTER — Encounter: Payer: Self-pay | Admitting: Physical Therapy

## 2019-09-07 ENCOUNTER — Other Ambulatory Visit: Payer: Self-pay

## 2019-09-07 DIAGNOSIS — M6281 Muscle weakness (generalized): Secondary | ICD-10-CM | POA: Diagnosis not present

## 2019-09-07 DIAGNOSIS — R293 Abnormal posture: Secondary | ICD-10-CM

## 2019-09-07 DIAGNOSIS — R29898 Other symptoms and signs involving the musculoskeletal system: Secondary | ICD-10-CM

## 2019-09-07 DIAGNOSIS — M6249 Contracture of muscle, multiple sites: Secondary | ICD-10-CM

## 2019-09-07 DIAGNOSIS — R2689 Other abnormalities of gait and mobility: Secondary | ICD-10-CM

## 2019-09-07 DIAGNOSIS — R2681 Unsteadiness on feet: Secondary | ICD-10-CM
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
## 2019-09-08 ENCOUNTER — Encounter (HOSPITAL_COMMUNITY): Payer: Self-pay

## 2019-09-08 ENCOUNTER — Other Ambulatory Visit: Payer: Self-pay | Admitting: Family Medicine

## 2019-09-08 ENCOUNTER — Ambulatory Visit (HOSPITAL_COMMUNITY)
Admission: RE | Admit: 2019-09-08 | Discharge: 2019-09-08 | Disposition: A | Discharge status: Home / Self Care | Payer: Self-pay | Source: Ambulatory Visit | Attending: Family | Admitting: Family

## 2019-09-08 ENCOUNTER — Encounter: Payer: Self-pay | Admitting: Family

## 2019-09-08 ENCOUNTER — Ambulatory Visit (HOSPITAL_COMMUNITY)
Admission: RE | Admit: 2019-09-08 | Discharge: 2019-09-08 | Disposition: A | Discharge status: Home / Self Care | Payer: Medicaid Other | Source: Ambulatory Visit | Attending: Family | Admitting: Family

## 2019-09-08 ENCOUNTER — Ambulatory Visit (INDEPENDENT_AMBULATORY_CARE_PROVIDER_SITE_OTHER): Payer: Self-pay | Admitting: Family

## 2019-09-08 VITALS — BP 126/80 | HR 62 | Temp 97.6°F | Resp 20 | Ht 72.0 in | Wt 202.0 lb

## 2019-09-08 DIAGNOSIS — I739 Peripheral vascular disease, unspecified: Secondary | ICD-10-CM | POA: Diagnosis not present

## 2019-09-08 DIAGNOSIS — S85501D Unspecified injury of popliteal vein, right leg, subsequent encounter: Secondary | ICD-10-CM

## 2019-09-08 DIAGNOSIS — S85501A Unspecified injury of popliteal vein, right leg, initial encounter: Secondary | ICD-10-CM

## 2019-09-08 DIAGNOSIS — E010 Iodine-deficiency related diffuse (endemic) goiter: Secondary | ICD-10-CM

## 2019-09-08 DIAGNOSIS — Z89611 Acquired absence of right leg above knee: Secondary | ICD-10-CM

## 2019-09-08 NOTE — Progress Notes (Signed)
Patient ID: Travis Benson, male   DOB: 1961/12/10, 58 y.o.   MRN: [id number]   Patient recently seen by vascular surgery and per note, provider who saw the patient felt that his thyroid was enlarged but felt no palpable nodules.  Recommendation was for thyroid ultrasound and TSH.  Patient will be contacted to have lab visit for TSH and to be scheduled for thyroid ultrasound.
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
## 2019-09-09 ENCOUNTER — Ambulatory Visit: Payer: Medicaid Other | Admitting: Physical Therapy

## 2019-09-10 ENCOUNTER — Telehealth: Payer: Self-pay | Admitting: *Deleted

## 2019-09-10 NOTE — Telephone Encounter (Signed)
-----   Message from Trecia Rogers, [place] sent at 09/09/2019  8:57 AM EDT ----- Regarding: FW: Follow-up  ----- Message ----- From: Antony Blackbird, MD Sent: 09/08/2019   6:53 PM EDT To: Trecia Rogers, CMA Subject: Follow-up                                      Please contact patient to let him know that his recent note from vascular surgery was reviewed and the provider at that visit felt that patient had some enlargement of his thyroid gland.  An order has been placed for patient to have a thyroid ultrasound and patient can come into the office for lab visit at his convenience for TSH-thyroid blood test.  Please schedule thyroid ultrasound or forward message to [facility] to schedule patient's ultrasound

## 2019-09-10 NOTE — Telephone Encounter (Signed)
Informed patient with what provider stated and he verbalized understanding. Ultrasound of the Thyroid was scheduled and patient is aware.

## 2019-09-13 ENCOUNTER — Telehealth: Payer: Self-pay | Admitting: Pharmacy Technician

## 2019-09-13 ENCOUNTER — Ambulatory Visit (HOSPITAL_COMMUNITY)
Admission: RE | Admit: 2019-09-13 | Discharge: 2019-09-13 | Disposition: A | Discharge status: Home / Self Care | Payer: Medicaid Other | Source: Ambulatory Visit | Attending: Family Medicine | Admitting: Family Medicine

## 2019-09-13 ENCOUNTER — Other Ambulatory Visit: Payer: Self-pay

## 2019-09-13 ENCOUNTER — Telehealth: Payer: Self-pay | Admitting: Infectious Diseases

## 2019-09-13 DIAGNOSIS — E010 Iodine-deficiency related diffuse (endemic) goiter: Secondary | ICD-10-CM | POA: Insufficient documentation

## 2019-09-13 MED ORDER — LEDIPASVIR-SOFOSBUVIR 90-400 MG PO TABS: 1.0000 | ORAL_TABLET | Freq: Every day | ORAL | 1 refills | Status: DC

## 2019-09-13 MED ORDER — HARVONI 45-200 MG PO PACK: 1.0000 | PACK | Freq: Every day | ORAL | 1 refills | Status: DC

## 2019-09-13 NOTE — Telephone Encounter (Addendum)
[facility] Patient Advocate Encounter  Completed and sent [employer] application for Harvoni for this patient who is uninsured.    He has been approved 09/15/2019 to 11/10/2019.    [facility] pharmacy will contact him directly to set up shipment.     Venida Jarvis. Nadara Mustard [place] Patient [facility] for Infectious Disease Phone: [phone] Fax:  [phone]

## 2019-09-13 NOTE — Telephone Encounter (Signed)
Left non-specific voicemail to patient requesting call back to discuss results of recent tests.    He has no findings concerning for cirrhosis on his blood work. With that and negative findings on recent imaging during his hospital stay we can go ahead and treat with Harvoni x 8 weeks.    Janene Madeira, MSN, NP-C [facility] for Infectious Disease [email or web address] Pager: [phone] Office: [phone] [place]: [phone]

## 2019-09-14 ENCOUNTER — Telehealth: Payer: Self-pay

## 2019-09-14 ENCOUNTER — Ambulatory Visit: Payer: Medicaid Other | Admitting: Physical Therapy

## 2019-09-14 NOTE — Telephone Encounter (Addendum)
-----   Message from Viann Fish, NP sent at 09/13/2019  5:55 PM EDT ----- Regarding: thyroid US results Dr. Chapman Fitch, I called pt and got his voicemail.  I will ask Larene Beach to call pt tomorrow and let him know that his thyroid ultrasound results are normal.  Thank you, Vinnie Level  ----- Message ----- From: Antony Blackbird, MD Sent: 09/13/2019   5:09 PM EDT To: Debbra Riding, RMA, Sharmon Leyden Nickel, NP  Please notify patient that thyroid ultrasound showed no abnormalities    Called and advised patients wife that results were normal.   York Cerise, [place]

## 2019-09-16 ENCOUNTER — Ambulatory Visit: Payer: Medicaid Other | Admitting: Physical Therapy

## 2019-09-18 ENCOUNTER — Other Ambulatory Visit: Payer: Self-pay

## 2019-09-18 ENCOUNTER — Ambulatory Visit
Admission: RE | Admit: 2019-09-18 | Discharge: 2019-09-18 | Disposition: A | Discharge status: Home / Self Care | Payer: Medicaid Other | Source: Ambulatory Visit | Attending: Orthopedic Surgery | Admitting: Orthopedic Surgery

## 2019-09-18 DIAGNOSIS — M25512 Pain in left shoulder: Secondary | ICD-10-CM

## 2019-09-20 ENCOUNTER — Ambulatory Visit: Payer: Medicaid Other | Admitting: Physical Therapy

## 2019-09-22 ENCOUNTER — Encounter: Payer: Self-pay | Admitting: Physical Therapy

## 2019-09-22 ENCOUNTER — Ambulatory Visit: Payer: Medicaid Other | Admitting: Physical Therapy

## 2019-09-22 ENCOUNTER — Other Ambulatory Visit: Payer: Self-pay
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
## 2019-09-28 ENCOUNTER — Ambulatory Visit: Payer: Medicaid Other | Admitting: Physical Therapy

## 2019-09-28 DIAGNOSIS — G5621 Lesion of ulnar nerve, right upper limb: Secondary | ICD-10-CM | POA: Diagnosis not present

## 2019-09-30 ENCOUNTER — Ambulatory Visit: Payer: Medicaid Other | Admitting: Physical Therapy

## 2019-10-04 ENCOUNTER — Telehealth: Payer: Self-pay | Admitting: Physical Therapy

## 2019-10-04 NOTE — Telephone Encounter (Signed)
Mr. Opara has progressed well with PT. He got Medicaid so a prosthesis will be covered now. He is scheduled to see you Wednesday, Oct. 7 for justification of prosthesis. [facility] will need a prescription & copy of your office note to submit to [facility] for authorization. He has potential to function at community level with a prosthesis. Initially will be limited in distance due to multiple medical issues.    He needs a rolling walker with fixed 5" wheels & right platform attachment. Can you please write a prescription for this and place in Epic or FAX to [phone]?  Thank you Jamey Reas, PT, DPT PT Specializing in [place] 10/04/2019@ 4:18 PM Phone:  [phone]  Fax:  [phone] [place] [address] [place], [place] [address]

## 2019-10-05 ENCOUNTER — Encounter: Payer: Self-pay | Admitting: Physical Therapy

## 2019-10-06 DIAGNOSIS — T79A21D Traumatic compartment syndrome of right lower extremity, subsequent encounter: Secondary | ICD-10-CM | POA: Diagnosis not present

## 2019-10-06 DIAGNOSIS — S72302D Unspecified fracture of shaft of left femur, subsequent encounter for closed fracture with routine healing: Secondary | ICD-10-CM | POA: Diagnosis not present

## 2019-10-06 DIAGNOSIS — M75102 Unspecified rotator cuff tear or rupture of left shoulder, not specified as traumatic: Secondary | ICD-10-CM | POA: Diagnosis not present

## 2019-10-06 DIAGNOSIS — S83104D Unspecified dislocation of right knee, subsequent encounter: Secondary | ICD-10-CM | POA: Diagnosis not present

## 2019-10-06 DIAGNOSIS — M86161 Other acute osteomyelitis, right tibia and fibula: Secondary | ICD-10-CM | POA: Diagnosis not present

## 2019-10-06 DIAGNOSIS — S82202F Unspecified fracture of shaft of left tibia, subsequent encounter for open fracture type IIIA, IIIB, or IIIC with routine healing: Secondary | ICD-10-CM | POA: Diagnosis not present

## 2019-10-07 ENCOUNTER — Encounter: Payer: Self-pay | Admitting: Physical Therapy

## 2019-10-25 NOTE — Progress Notes (Addendum)
PCP - Dr Sunny Schlein  Cardiologist - none  Chest x-ray - 04/05/2019  EKG - 03/19/2019  Stress Test - no  ECHO - no Cardiac Cath -   sleep Study - na CPAP - na  LABS- CBC, CMP  ASA- 81 called and spoke with Ria Comment at Dr. Carlean Jews office, she will check with Dr Marcelino Scot and inform patient.  ERAS-no  HA1C-na Fasting Blood Sugar - 0 Checks Blood Sugar ___0_ times a day  Anesthesia-  Pt denies having chest pain, sob, or fever at this time. All instructions explained to the pt, with a verbal understanding of the material. Pt agrees to go over the instructions while at home for a better understanding. Pt also instructed to self quarantine after being tested for COVID-19. The opportunity to ask questions was provided.

## 2019-10-25 NOTE — Pre-Procedure Instructions (Signed)
Travis Benson  10/25/2019     Your procedure is scheduled on Thursday, October 29..  Report to [facility], Main Entrance or Entrance "A" at 1210 A.M.               Your surgery or procedure is scheduled for 2:10 pm    Call this number if you have problems the morning of [phone]  This is the number for the Pre- Surgical Desk.     Remember:  Do not eat or drink after midnight  Wednesday, October 28.   Take these medicines the morning of surgery with A SIP OF WATER :  amLODipine (NORVASC)             Ledipasvir-Sofosbuvir (HARVONI)    STOP taking Aspirin, Aspirin Products (Goody Powder, Excedrin Migraine), Ibuprofen (Advil), Naproxen (Aleve), Vitamins and Herbal Products (ie Fish Oil).   [place]- Preparing For Surgery  Before surgery, you can play an important role. Because skin is not sterile, your skin needs to be as free of germs as possible. You can reduce the number of germs on your skin by washing with CHG (chlorahexidine gluconate) Soap before surgery.  CHG is an antiseptic cleaner which kills germs and bonds with the skin to continue killing germs even after washing.    Oral Hygiene is also important to reduce your risk of infection.  Remember - BRUSH YOUR TEETH THE MORNING OF SURGERY WITH YOUR REGULAR TOOTHPASTE  Please do not use if you have an allergy to CHG or antibacterial soaps. If your skin becomes reddened/irritated stop using the CHG.  Do not shave (including legs and underarms) for at least 48 hours prior to first CHG shower. It is OK to shave your face.  Please follow these instructions carefully.   1. Shower the NIGHT BEFORE SURGERY and the MORNING OF SURGERY with CHG.   2. If you chose to wash your hair, wash your hair first as usual with your normal shampoo.  3. After you shampoo,wash your face and private area with the soap you use at home, then rinse your hair and body thoroughly to remove the shampoo and soap.  4. Use CHG  as you would any other liquid soap. You can apply CHG directly to the skin and wash gently with a scrungie or a clean washcloth.   5. Apply the CHG Soap to your body ONLY FROM THE NECK DOWN.  Do not use on open wounds or open sores. Avoid contact with your eyes, ears, mouth and genitals (private parts)  6. Wash thoroughly, paying special attention to the area where your surgery will be performed.  7. Thoroughly rinse your body with warm water from the neck down.  8. DO NOT shower/wash with your normal soap after using and rinsing off the CHG Soap.  9. Pat yourself dry with a CLEAN TOWEL.  10. Wear CLEAN PAJAMAS to bed the night before surgery, wear comfortable clothes the morning of surgery  11. Place CLEAN SHEETS on your bed the night of your first shower and DO NOT SLEEP WITH PETS. 12.  Day of Surgery: Shower as instructed above. Do not apply any deodorants/lotions, powders or colognes.  Please wear clean clothes to the hospital/surgery center.   Remember to brush your teeth WITH YOUR REGULAR TOOTHPASTE.  Do not wear jewelry, make-up or nail polish.   Men may shave face and neck.  Do not bring valuables to the hospital.  [facility] is not responsible for  any belongings or valuables.  Contacts, dentures or bridgework may not be worn into surgery.  Leave your suitcase in the car.  After surgery it may be brought to your room.  For patients admitted to the hospital, discharge time will be determined by your treatment team.  Patients discharged the day of surgery will not be allowed to drive home.   Please read over the following fact sheets that you were given: Pain Booklet, Incentive Spirometry, Surgical Site Infections.

## 2019-10-26 ENCOUNTER — Encounter (HOSPITAL_COMMUNITY): Payer: Self-pay

## 2019-10-26 ENCOUNTER — Encounter (HOSPITAL_COMMUNITY)
Admission: RE | Admit: 2019-10-26 | Discharge: 2019-10-26 | Disposition: A | Discharge status: Home / Self Care | Payer: Medicaid Other | Source: Ambulatory Visit | Attending: Orthopedic Surgery | Admitting: Orthopedic Surgery

## 2019-10-26 ENCOUNTER — Other Ambulatory Visit: Payer: Self-pay

## 2019-10-26 ENCOUNTER — Other Ambulatory Visit (HOSPITAL_COMMUNITY)
Admission: RE | Admit: 2019-10-26 | Discharge: 2019-10-26 | Disposition: A | Discharge status: Home / Self Care | Payer: Medicaid Other | Source: Ambulatory Visit | Attending: Orthopedic Surgery | Admitting: Orthopedic Surgery

## 2019-10-26 DIAGNOSIS — Z01812 Encounter for preprocedural laboratory examination: Secondary | ICD-10-CM | POA: Insufficient documentation

## 2019-10-26 HISTORY — DX: Unspecified osteoarthritis, unspecified site: M19.90

## 2019-10-26 HISTORY — DX: Unspecified intracranial injury with loss of consciousness of unspecified duration, initial encounter: S06.9X9A

## 2019-10-26 HISTORY — DX: Other complications of anesthesia, initial encounter: T88.59XA

## 2019-10-26 LAB — COMPREHENSIVE METABOLIC PANEL
ALT: 14 U/L (ref 0–44)
AST: 19 U/L (ref 15–41)
Albumin: 3.6 g/dL (ref 3.5–5.0)
Alkaline Phosphatase: 118 U/L (ref 38–126)
Anion gap: 10 (ref 5–15)
BUN: 12 mg/dL (ref 6–20)
CO2: 21 mmol/L — ABNORMAL LOW (ref 22–32)
Calcium: 9.5 mg/dL (ref 8.9–10.3)
Chloride: 107 mmol/L (ref 98–111)
Creatinine, Ser: 1.24 mg/dL (ref 0.61–1.24)
GFR calc Af Amer: >60 mL/min (ref >60)
GFR calc non Af Amer: >60 mL/min (ref >60)
Glucose, Bld: 100 mg/dL — ABNORMAL HIGH (ref 70–99)
Potassium: 4.1 mmol/L (ref 3.5–5.1)
Sodium: 138 mmol/L (ref 135–145)
Total Bilirubin: 0.9 mg/dL (ref 0.3–1.2)
Total Protein: 8.5 g/dL — ABNORMAL HIGH (ref 6.5–8.1)

## 2019-10-26 LAB — CBC
HCT: 46.5 % (ref 39.0–52.0)
Hemoglobin: 15.3 g/dL (ref 13.0–17.0)
MCH: 29.7 pg (ref 26.0–34.0)
MCHC: 32.9 g/dL (ref 30.0–36.0)
MCV: 90.3 fL (ref 80.0–100.0)
Platelets: 507 10*3/uL — ABNORMAL HIGH (ref 150–400)
RBC: 5.15 MIL/uL (ref 4.22–5.81)
RDW: 12.4 % (ref 11.5–15.5)
WBC: 5.1 10*3/uL (ref 4.0–10.5)
nRBC: 0 % (ref 0.0–0.2)

## 2019-10-26 LAB — SARS CORONAVIRUS 2 (TAT 6-24 HRS): SARS Coronavirus 2: NEGATIVE

## 2019-10-28 ENCOUNTER — Ambulatory Visit (HOSPITAL_COMMUNITY): Admission: RE | Admit: 2019-10-28 | Payer: Medicaid Other | Source: Home / Self Care | Admitting: Orthopedic Surgery

## 2019-10-28 ENCOUNTER — Encounter (HOSPITAL_COMMUNITY): Admission: RE | Payer: Self-pay | Source: Home / Self Care

## 2019-10-28 SURGERY — REMOVAL, HARDWARE
Anesthesia: Choice | Laterality: Left

## 2019-11-05 MED FILL — ?AMLODIPINE BESYLATE 5MG TA: 5 | 30 days supply | Qty: 30 | Fill #1

## 2019-11-05 MED FILL — GABAPENTIN 300 MG CAPSULE: 300 | 30 days supply | Qty: 120 | Fill #1

## 2019-11-15 ENCOUNTER — Other Ambulatory Visit: Payer: Self-pay

## 2019-11-15 ENCOUNTER — Ambulatory Visit (INDEPENDENT_AMBULATORY_CARE_PROVIDER_SITE_OTHER): Payer: Medicaid Other | Admitting: Pharmacist

## 2019-11-15 DIAGNOSIS — B182 Chronic viral hepatitis C: Secondary | ICD-10-CM

## 2019-11-15 NOTE — Progress Notes (Signed)
HPI: Travis Benson is a 58 y.o. male who presents to the [facility] for Hepatitis C follow-up.  Medication: Harvoni   Start Date: 09/22/2019  Hepatitis C Genotype: 1a  Fibrosis Score: F2  Hepatitis C RNA: 2,540,000 on 08/24/2019  Patient Active Problem List   Diagnosis Date Noted  . Chronic hepatitis C without hepatic coma ([place]) 04/22/2019  . Osteomyelitis of right tibia ([place]) 04/11/2019  . Open left tibial fracture 03/02/2019  . Unspecified injury of popliteal artery, right leg, initial encounter 03/02/2019  . Popliteal vein injury, right, initial encounter 03/02/2019  . 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 Pelvic ring fracture ([place]), Right  03/02/2019  . Left scapula fracture 03/02/2019  . Right knee dislocation 03/02/2019  . Femur fracture, left ([place]) 02/28/2019    Patient's Medications  New Prescriptions   No medications on file  Previous Medications   ALBUTEROL (PROVENTIL) (2.5 MG/3ML) 0.083% NEBULIZER SOLUTION    Take 3 mLs (2.5 mg total) by nebulization every 6 (six) hours as needed for wheezing or shortness of breath.   AMLODIPINE (NORVASC) 5 MG TABLET    Take 1 tablet (5 mg total) by mouth daily. To lower blood pressure   ASPIRIN EC 81 MG EC TABLET    Take 1 tablet (81 mg total) by mouth daily.   DOCUSATE SODIUM (COLACE) 100 MG CAPSULE    Take 1 capsule (100 mg total) by mouth 2 (two) times daily.   GABAPENTIN (NEURONTIN) 300 MG CAPSULE    One pill twice per day and two pills at bedtime   GABAPENTIN (NEURONTIN) 600 MG TABLET    Take 600 mg by mouth at bedtime.   IBUPROFEN (ADVIL) 200 MG TABLET    Take 400 mg by mouth every 6 (six) hours as needed for moderate pain.   LEDIPASVIR-SOFOSBUVIR (HARVONI) 90-400 MG TABS    Take 1 tablet by mouth daily.  Modified Medications   No medications on file  Discontinued Medications   No medications on file    Allergies: No Known Allergies  Past Medical History: Past Medical History:  Diagnosis Date   . Arthritis    neck  . Complication of anesthesia    "crazy dreams"  . Coronary artery disease   . Head injury with loss of consciousness ([place])   . History of hepatitis C   . Hypertension   . 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 Left scapula fracture 03/02/2019  . Open left tibial fracture 03/02/2019  . Pelvic ring fracture ([place]), Right  03/02/2019  . Right knee dislocation 03/02/2019  . Unspecified injury of popliteal artery, right leg, initial encounter 03/02/2019    Social History: Social History   Socioeconomic History  . Marital status: Married    Spouse name: Not on file  . Number of children: Not on file  . Years of education: Not on file  . Highest education level: Not on file  Occupational History  . Not on file  Social Needs  . Financial resource strain: Patient refused  . Food insecurity    Worry: Patient refused    Inability: Patient refused  . Transportation needs    Medical: Patient refused    Non-medical: Patient refused  Tobacco Use  . Smoking status: Never Smoker  . Smokeless tobacco: Never Used  Substance and Sexual Activity  . Alcohol use: Never    Frequency: Never  . Drug use: Never  . Sexual activity: Not Currently  Birth control/protection: None  Lifestyle  . Physical activity    Days per week: Patient refused    Minutes per session: Patient refused  . Stress: Patient refused  Relationships  . Social Musician on phone: Patient refused    Gets together: Patient refused    Attends religious service: Patient refused    Active member of club or organization: Patient refused    Attends meetings of clubs or organizations: Patient refused    Relationship status: Patient refused  Other Topics Concern  . Not on file  Social History Narrative   ** Merged History Encounter **        Labs: Hepatitis C Lab Results  Component Value Date   HCVGENOTYPE Comment 04/19/2019   HCVRNAPCRQN 2,540,000 (H) 08/24/2019   FIBROSTAGE F2  08/24/2019   Hepatitis B Lab Results  Component Value Date   HEPBSAB NON-REACTIVE 08/24/2019   HEPBSAG Negative 04/17/2019   HEPBCAB REACTIVE (A) 08/24/2019   Hepatitis A Lab Results  Component Value Date   HAV REACTIVE (A) 08/24/2019   HIV Lab Results  Component Value Date   HIV Non Reactive 03/01/2019   Lab Results  Component Value Date   CREATININE 1.24 10/26/2019   CREATININE 0.96 08/24/2019   CREATININE 0.93 06/18/2019   CREATININE 0.74 04/18/2019   CREATININE 0.84 04/14/2019   Lab Results  Component Value Date   AST 19 10/26/2019   AST 39 (H) 08/24/2019   AST 42 (H) 06/18/2019   ALT 14 10/26/2019   ALT 41 08/24/2019   ALT 38 08/24/2019   INR 1.1 03/01/2019   INR 1.7 (H) 02/28/2019   INR 1.1 02/28/2019    Assessment: Travis Benson is here today to follow up after completing his 8 weeks of Harvoni. He says he has two doses left one for today and one for tomorrow so he will officially finish tomorrow 11/16/19. He says these past two months have went well, no major issues taking the medication. He did have some nausea when he first started but this subsided after a few days which is normal. He also reports he did not miss any doses. He was congratulated on his perfect adherence and I explained to him going forward what the plan would be for his hepatitis C before we could officially declare him cured. We will check a hepatitis C viral load today and then again at his cure visit in three months. If he is undetectable at that visit then he will be considered cured.   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 His surface antigen was not-reactive so we will check that again today and if it is negative we would recommend he get vaccinated at his next visit.    Plan: - HCV RNA, Hepatitis B surface antibody - Follow up 02/17/2020 for cure visit    Jettie Pagan, PharmD PGY2  Infectious Disease Pharmacy Resident  [facility] 11/15/2019, 4:14 PM

## 2019-11-16 ENCOUNTER — Other Ambulatory Visit (HOSPITAL_COMMUNITY)
Admission: RE | Admit: 2019-11-16 | Discharge: 2019-11-16 | Disposition: A | Discharge status: Home / Self Care | Payer: Medicaid Other | Source: Ambulatory Visit | Attending: Orthopedic Surgery | Admitting: Orthopedic Surgery

## 2019-11-16 DIAGNOSIS — Z01812 Encounter for preprocedural laboratory examination: Secondary | ICD-10-CM | POA: Diagnosis not present

## 2019-11-16 DIAGNOSIS — Z20828 Contact with and (suspected) exposure to other viral communicable diseases: Secondary | ICD-10-CM | POA: Insufficient documentation

## 2019-11-17 ENCOUNTER — Other Ambulatory Visit: Payer: Self-pay

## 2019-11-17 ENCOUNTER — Encounter: Payer: Self-pay | Admitting: Physical Therapy

## 2019-11-17 ENCOUNTER — Ambulatory Visit: Payer: Medicaid Other | Attending: Family Medicine | Admitting: Physical Therapy

## 2019-11-17 DIAGNOSIS — M25652 Stiffness of left hip, not elsewhere classified: Secondary | ICD-10-CM

## 2019-11-17 DIAGNOSIS — M6281 Muscle weakness (generalized): Secondary | ICD-10-CM | POA: Diagnosis not present

## 2019-11-17 DIAGNOSIS — R2689 Other abnormalities of gait and mobility: Secondary | ICD-10-CM

## 2019-11-17 DIAGNOSIS — M6249 Contracture of muscle, multiple sites: Secondary | ICD-10-CM

## 2019-11-17 DIAGNOSIS — M25672 Stiffness of left ankle, not elsewhere classified: Secondary | ICD-10-CM | POA: Diagnosis not present

## 2019-11-17 DIAGNOSIS — R29898 Other symptoms and signs involving the musculoskeletal system: Secondary | ICD-10-CM | POA: Diagnosis not present

## 2019-11-17 DIAGNOSIS — R293 Abnormal posture: Secondary | ICD-10-CM | POA: Diagnosis not present

## 2019-11-17 DIAGNOSIS — M25651 Stiffness of right hip, not elsewhere classified: Secondary | ICD-10-CM | POA: Diagnosis not present

## 2019-11-17 DIAGNOSIS — R2681 Unsteadiness on feet: Secondary | ICD-10-CM

## 2019-11-17 DIAGNOSIS — M25612 Stiffness of left shoulder, not elsewhere classified: Secondary | ICD-10-CM

## 2019-11-17 DIAGNOSIS — Z89611 Acquired absence of right leg above knee: Secondary | ICD-10-CM | POA: Diagnosis not present

## 2019-11-17 LAB — NOVEL CORONAVIRUS, NAA (HOSP ORDER, SEND-OUT TO REF LAB; TAT 18-24 HRS): SARS-CoV-2, NAA: NOT DETECTED
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
## 2019-11-17 NOTE — Patient Instructions (Signed)
PT educated patient on residual limb care and the importance of checking the limb daily for sweating, discoloration, and blistering d/t prosthetic wear time/tolerance. Pt was informed to wear the shrinker at all times when the liner and prosthesis is not on to control fluctuations in edema. Pt was educated on how to properly clean, don/doff liner and prosthesis.  Sit to Stand  1. Come to the edge of the chair and have RW in front of you.  2. When coming from sit to stand, have L hand on arm rest and R hand on the walker.  3. You must put weight through the heel of the prosthesis to engage it in order to lock into extension. Bow forward and weight shift over prosthesis to remain locked in extension.  4. Reach with L hand to walker and come to stand.   Stand to Sit 1. Apply pressure on the toe of the prosthesis to unlock and flex knee.  2. NEVER REACH BACK WITH THE R HAND (PROSTHETIC SIDE) ONLY. Reach back with either the L hand or both to arm rests.  3. Once you feel the arm rest, you can safely sit down into chair.

## 2019-11-18 ENCOUNTER — Encounter (HOSPITAL_COMMUNITY): Payer: Self-pay | Admitting: *Deleted

## 2019-11-18 DIAGNOSIS — B182 Chronic viral hepatitis C: Secondary | ICD-10-CM | POA: Diagnosis not present

## 2019-11-18 DIAGNOSIS — X58XXXA Exposure to other specified factors, initial encounter: Secondary | ICD-10-CM | POA: Diagnosis not present

## 2019-11-18 DIAGNOSIS — T8484XA Pain due to internal orthopedic prosthetic devices, implants and grafts, initial encounter: Secondary | ICD-10-CM | POA: Diagnosis not present

## 2019-11-18 DIAGNOSIS — Z79899 Other long term (current) drug therapy: Secondary | ICD-10-CM | POA: Diagnosis not present

## 2019-11-18 DIAGNOSIS — I10 Essential (primary) hypertension: Secondary | ICD-10-CM | POA: Diagnosis not present

## 2019-11-18 DIAGNOSIS — Z791 Long term (current) use of non-steroidal anti-inflammatories (NSAID): Secondary | ICD-10-CM | POA: Diagnosis not present

## 2019-11-18 DIAGNOSIS — Z7982 Long term (current) use of aspirin: Secondary | ICD-10-CM | POA: Diagnosis not present

## 2019-11-18 DIAGNOSIS — I2510 Atherosclerotic heart disease of native coronary artery without angina pectoris: Secondary | ICD-10-CM | POA: Diagnosis not present

## 2019-11-18 DIAGNOSIS — Z89611 Acquired absence of right leg above knee: Secondary | ICD-10-CM | POA: Diagnosis not present

## 2019-11-18 NOTE — H&P (Signed)
Orthopaedic Trauma Service (OTS) Consult   Patient ID: Travis Benson MRN: [id number] DOB/AGE: 06/11/1961 58 y.o.   HPI: Travis Benson is an [age] y.o. black male well known to the OTS for polytrauma 02/2019 also with chronic hepatitis C that was diagnosed during that admission. Pt sustained many injuries including complex R leg injury ultimately requiring AKA and L tibia fracture which was addressed with IMN. Pt has done remarkably well. However, he does complain of painful hardware in his left tibia. Pt has failed conservative measures and presents today for hardware removal.   Past Medical History:  Diagnosis Date  . Arthritis    neck  . Complication of anesthesia    "crazy dreams"  . Coronary artery disease   . Head injury with loss of consciousness (HCC)   . History of hepatitis C   . Hypertension   . 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 Left scapula fracture 03/02/2019  . Open left tibial fracture 03/02/2019  . Pelvic ring fracture (HCC), Right  03/02/2019  . Right knee dislocation 03/02/2019  . Unspecified injury of popliteal artery, right leg, initial encounter 03/02/2019    Past Surgical History:  Procedure Laterality Date  . AMPUTATION Right 04/13/2019   Procedure: AMPUTATION ABOVE KNEE;  Surgeon: Myrene GalasHandy, Starlina Lapre, MD;  [facility];  Service: Orthopedics;  Laterality: Right;  . APPLICATION OF WOUND VAC Bilateral 03/05/2019   Procedure: WOUND VAC CHANGE RIGHT LOWER LEG; REMOVAL OF WOUND VAC LEFT LOWER LEG WITH APPLICATION OF DRESSINGS;  Surgeon: Larina EarthlyEarly, Todd F, MD;  Location: [facility] OR;  Service: Vascular;  Laterality: Bilateral;  . APPLICATION OF WOUND VAC Right 04/09/2019   Procedure: APPLICATION OF WOUND VAC;  Surgeon: Myrene GalasHandy, Heidie Krall, MD;  Location: [facility] OR;  Service: Orthopedics;  Laterality: Right;  . APPLICATION OF WOUND VAC Bilateral 03/02/2019   Procedure: Application Of Wound Vac;  Surgeon: Myrene GalasHandy, Sheritta Deeg, MD;  [facility];  Service: Orthopedics;   Laterality: Bilateral;  . APPLICATION OF WOUND VAC Right 03/02/2019   Procedure: Wound Vac dressing removal;  Surgeon: Maeola Harmanain, Brandon Christopher, MD;  [facility];  Service: Vascular;  Laterality: Right;  . EXTERNAL FIXATION LEG Right 03/02/2019   Procedure: External Fixation Leg;  Surgeon: Myrene GalasHandy, Ezel Vallone, MD;  [facility];  Service: Orthopedics;  Laterality: Right;  . EXTERNAL FIXATION REMOVAL Left 03/02/2019   Procedure: REMOVAL EXTERNAL FIXATION LEG;  Surgeon: Myrene GalasHandy, Kishawn Pickar, MD;  [facility];  Service: Orthopedics;  Laterality: Left;  . FASCIOTOMY Right 02/28/2019   Procedure: FOUR COMPARTMENT FASCIOTOMY OF RIGHT LOWER LEG;  Surgeon: Maeola Harmanain, Brandon Christopher, MD;  [facility];  Service: Vascular;  Laterality: Right;  . FEMORAL-POPLITEAL BYPASS GRAFT Right 02/28/2019   Procedure: BYPASS GRAFT OF ABOVE KNEE POPLITEAL- BELOW KNEE POPLITEAL ARTERY;  Surgeon: Maeola Harmanain, Brandon Christopher, MD;  [facility];  Service: Vascular;  Laterality: Right;  . FEMUR IM NAIL Left 03/02/2019   Procedure: INTRAMEDULLARY (IM) RETROGRADE FEMORAL NAILING;  Surgeon: Myrene GalasHandy, Linley Moxley, MD;  Location: [facility] OR;  Service: Orthopedics;  Laterality: Left;  . I&D EXTREMITY Left 02/28/2019   Procedure: IRRIGATION AND DEBRIDEMENT OF LEFT LOWER LEG /INTERNAL FIXATION OF LEFT TIBIA PLACEMENT OF FRACTURE PINLEFT FEMUR/ CLOSED REDUCTION OF LEFT FEMUR.;  Surgeon: Durene Romanslin, Matthew, MD;  Location: [facility] OR;  Service: Orthopedics;  Laterality: Left;  . I&D EXTREMITY Right 04/09/2019   Procedure: IRRIGATION AND DEBRIDEMENT EXTREMITY R lower leg;  Surgeon: Myrene GalasHandy, Quincee Gittens, MD;  Location: [facility] OR;  Service: Orthopedics;  Laterality: Right;  . I&D EXTREMITY Right 04/13/2019   Procedure: IRRIGATION AND DEBRIDEMENT EXTREMITY Right Leg;  Surgeon: Myrene Galas, MD;  Location: [facility] OR;  Service: Orthopedics;  Laterality: Right;  . I&D EXTREMITY Left 03/02/2019   Procedure: IRRIGATION AND DEBRIDEMENT LEG;  Surgeon: Myrene Galas, MD;  [facility]: [facility] OR;   Service: Orthopedics;  Laterality: Left;  . ORIF PELVIC FRACTURE WITH PERCUTANEOUS SCREWS Right 03/02/2019   Procedure: Orif Pelvic Fracture With Percutaneous Screws;  Surgeon: Myrene Galas, MD;  Location: [facility] OR;  Service: Orthopedics;  Laterality: Right;  . SKIN SPLIT GRAFT Right 03/26/2019   Procedure: SKIN GRAFT SPLIT THICKNESS;  Surgeon: Myrene Galas, MD;  Location: [facility];  Service: Orthopedics;  Laterality: Right;  . TIBIA IM NAIL INSERTION Left 03/02/2019   Procedure: INTRAMEDULLARY (IM) NAIL TIBIAL;  Surgeon: Myrene Galas, MD;  Location: [facility] OR;  Service: Orthopedics;  Laterality: Left;    Family History  Problem Relation Age of Onset  . Kidney failure Mother   . Diabetes Father     Social History:  reports that he has never smoked. He has never used smokeless tobacco. He reports that he does not drink alcohol or use drugs.  Allergies: No Known Allergies  Medications:  Prior to Admission:  Medications Prior to Admission  Medication Sig Dispense Refill Last Dose  . amLODipine (NORVASC) 5 MG tablet Take 1 tablet (5 mg total) by mouth daily. To lower blood pressure 30 tablet 5 11/19/2019 at 0400  . aspirin EC 81 MG EC tablet Take 1 tablet (81 mg total) by mouth daily.   Past Week at Unknown time  . gabapentin (NEURONTIN) 600 MG tablet Take 600 mg by mouth at bedtime.   11/18/2019 at Unknown time  . ibuprofen (ADVIL) 200 MG tablet Take 400 mg by mouth every 6 (six) hours as needed for moderate pain.   11/18/2019 at Unknown time  . albuterol (PROVENTIL) (2.5 MG/3ML) 0.083% nebulizer solution Take 3 mLs (2.5 mg total) by nebulization every 6 (six) hours as needed for wheezing or shortness of breath. (Patient not taking: Reported on 10/25/2019) 75 mL 12   . docusate sodium (COLACE) 100 MG capsule Take 1 capsule (100 mg total) by mouth 2 (two) times daily. (Patient not taking: Reported on 10/25/2019) 10 capsule 0   . gabapentin (NEURONTIN) 300 MG capsule One pill twice per day and two  pills at bedtime (Patient taking differently: 300 mg. ) 120 capsule 5   . Ledipasvir-Sofosbuvir (HARVONI) 90-400 MG TABS Take 1 tablet by mouth daily. (Patient not taking: Reported on 11/17/2019) 28 tablet 1     No results found for this or any previous visit (from the past 48 hour(s)).  No results found.  Review of Systems  Constitutional: Negative for chills and fever.  Respiratory: Negative for shortness of breath and wheezing.   Cardiovascular: Negative for chest pain and palpitations.  Gastrointestinal: Negative for abdominal pain, nausea and vomiting.  Musculoskeletal:       Left tibia pain    There were no vitals taken for this visit. Physical Exam Cardiovascular:     Rate and Rhythm: Normal rate and regular rhythm.  Pulmonary:     Effort: Pulmonary effort is normal.     Breath sounds: Normal breath sounds.  Musculoskeletal:     Comments: Left lower extremity  nontender over fracture site Tender over distal locking bolts Distal motor and sensory functions intact Ext warm Excellent knee and ankle ROM   Skin:  Capillary Refill: Capillary refill takes less than 2 seconds.  Neurological:     General: No focal deficit present.     Mental Status: He is alert and oriented to person, place, and time.  Psychiatric:        Mood and Affect: Mood normal.        Behavior: Behavior normal.      Assessment/Plan:  58 y/o male s/p complex polytrauma with R AKA and symptomatic hardware L tibia   -symptomatic hardware left tibia   OR for removal of locking screws +/- nail removal   outpt procedure   WBAT post op  ROM as tolerated post op   Risks and benefits reviewed, he wishes to proceed   - Dispo:  OR for [place], PA-C [phone] (C) 11/18/2019, 8:55 PM  [facility] [place] [phone] Jenetta [phone] (F)    I have seen and examined the patient. I agree with the findings above.   I discussed with the patient the risks and benefits of surgery for removal of distal locking screws on the left tibia, including the possibility of infection, nerve injury, vessel injury, wound breakdown, arthritis, symptomatic hardware, DVT/ PE, loss of motion, and need for further surgery among others. He acknowledged these risks and wished to proceed.   Rozanna Box, MD 11/19/2019 8:00 AM

## 2019-11-19 ENCOUNTER — Ambulatory Visit (HOSPITAL_COMMUNITY): Payer: Medicaid Other | Admitting: Anesthesiology

## 2019-11-19 ENCOUNTER — Encounter (HOSPITAL_COMMUNITY)
Admission: RE | Disposition: A | Discharge status: Home / Self Care | Payer: Self-pay | Source: Home / Self Care | Attending: Orthopedic Surgery

## 2019-11-19 ENCOUNTER — Ambulatory Visit (HOSPITAL_COMMUNITY): Payer: Medicaid Other

## 2019-11-19 ENCOUNTER — Ambulatory Visit (HOSPITAL_COMMUNITY)
Admission: RE | Admit: 2019-11-19 | Discharge: 2019-11-19 | Disposition: A | Discharge status: Home / Self Care | Payer: Medicaid Other | Attending: Orthopedic Surgery | Admitting: Orthopedic Surgery

## 2019-11-19 ENCOUNTER — Other Ambulatory Visit: Payer: Self-pay

## 2019-11-19 ENCOUNTER — Encounter (HOSPITAL_COMMUNITY): Payer: Self-pay | Admitting: Certified Registered Nurse Anesthetist

## 2019-11-19 DIAGNOSIS — I2510 Atherosclerotic heart disease of native coronary artery without angina pectoris: Secondary | ICD-10-CM | POA: Insufficient documentation

## 2019-11-19 DIAGNOSIS — B182 Chronic viral hepatitis C: Secondary | ICD-10-CM | POA: Diagnosis not present

## 2019-11-19 DIAGNOSIS — Z89611 Acquired absence of right leg above knee: Secondary | ICD-10-CM | POA: Insufficient documentation

## 2019-11-19 DIAGNOSIS — Z79899 Other long term (current) drug therapy: Secondary | ICD-10-CM | POA: Insufficient documentation

## 2019-11-19 DIAGNOSIS — T8484XA Pain due to internal orthopedic prosthetic devices, implants and grafts, initial encounter: Secondary | ICD-10-CM | POA: Insufficient documentation

## 2019-11-19 DIAGNOSIS — I10 Essential (primary) hypertension: Secondary | ICD-10-CM | POA: Diagnosis not present

## 2019-11-19 DIAGNOSIS — Z7982 Long term (current) use of aspirin: Secondary | ICD-10-CM | POA: Diagnosis not present

## 2019-11-19 DIAGNOSIS — S82202C Unspecified fracture of shaft of left tibia, initial encounter for open fracture type IIIA, IIIB, or IIIC: Secondary | ICD-10-CM | POA: Diagnosis not present

## 2019-11-19 DIAGNOSIS — X58XXXA Exposure to other specified factors, initial encounter: Secondary | ICD-10-CM | POA: Insufficient documentation

## 2019-11-19 DIAGNOSIS — Z472 Encounter for removal of internal fixation device: Secondary | ICD-10-CM | POA: Diagnosis not present

## 2019-11-19 DIAGNOSIS — T849XXA Unspecified complication of internal orthopedic prosthetic device, implant and graft, initial encounter: Secondary | ICD-10-CM | POA: Diagnosis not present

## 2019-11-19 DIAGNOSIS — Z791 Long term (current) use of non-steroidal anti-inflammatories (NSAID): Secondary | ICD-10-CM | POA: Insufficient documentation

## 2019-11-19 DIAGNOSIS — Z419 Encounter for procedure for purposes other than remedying health state, unspecified: Secondary | ICD-10-CM

## 2019-11-19 HISTORY — PX: HARDWARE REMOVAL: SHX979

## 2019-11-19 LAB — COMPREHENSIVE METABOLIC PANEL
ALT: 15 U/L (ref 0–44)
AST: 28 U/L (ref 15–41)
Albumin: 3.4 g/dL — ABNORMAL LOW (ref 3.5–5.0)
Alkaline Phosphatase: 155 U/L — ABNORMAL HIGH (ref 38–126)
Anion gap: 13 (ref 5–15)
BUN: 13 mg/dL (ref 6–20)
CO2: 20 mmol/L — ABNORMAL LOW (ref 22–32)
Calcium: 9.2 mg/dL (ref 8.9–10.3)
Chloride: 107 mmol/L (ref 98–111)
Creatinine, Ser: 1.23 mg/dL (ref 0.61–1.24)
GFR calc Af Amer: >60 mL/min (ref >60)
GFR calc non Af Amer: >60 mL/min (ref >60)
Glucose, Bld: 98 mg/dL (ref 70–99)
Potassium: 4.4 mmol/L (ref 3.5–5.1)
Sodium: 140 mmol/L (ref 135–145)
Total Bilirubin: 0.9 mg/dL (ref 0.3–1.2)
Total Protein: 7.8 g/dL (ref 6.5–8.1)

## 2019-11-19 LAB — CBC
HCT: 43.8 % (ref 39.0–52.0)
Hemoglobin: 14.4 g/dL (ref 13.0–17.0)
MCH: 30.2 pg (ref 26.0–34.0)
MCHC: 32.9 g/dL (ref 30.0–36.0)
MCV: 91.8 fL (ref 80.0–100.0)
Platelets: 540 10*3/uL — ABNORMAL HIGH (ref 150–400)
RBC: 4.77 MIL/uL (ref 4.22–5.81)
RDW: 12.6 % (ref 11.5–15.5)
WBC: 6.1 10*3/uL (ref 4.0–10.5)
nRBC: 0 % (ref 0.0–0.2)

## 2019-11-19 SURGERY — REMOVAL, HARDWARE
Anesthesia: General | Laterality: Left

## 2019-11-19 MED ORDER — LACTATED RINGERS IV SOLN
INTRAVENOUS | Status: DC
  Administered 2019-11-19: 07:00:00 via INTRAVENOUS

## 2019-11-19 MED ORDER — PROPOFOL 500 MG/50ML IV EMUL
INTRAVENOUS | Status: DC | PRN
  Administered 2019-11-19: 125 ug/kg/min via INTRAVENOUS

## 2019-11-19 MED ORDER — ACETAMINOPHEN 10 MG/ML IV SOLN: 1000.0000 mg | Freq: Once | INTRAVENOUS | Status: DC | PRN

## 2019-11-19 MED ORDER — CEFAZOLIN SODIUM-DEXTROSE 2-4 GM/100ML-% IV SOLN
2.0000 g | INTRAVENOUS | Status: AC
  Administered 2019-11-19: 2 g via INTRAVENOUS
  Filled 2019-11-19: qty 100

## 2019-11-19 MED ORDER — MIDAZOLAM HCL 2 MG/2ML IJ SOLN
INTRAMUSCULAR | Status: AC
  Filled 2019-11-19: qty 2

## 2019-11-19 MED ORDER — CHLORHEXIDINE GLUCONATE 4 % EX LIQD: 60.0000 mL | Freq: Once | CUTANEOUS | Status: DC

## 2019-11-19 MED ORDER — MIDAZOLAM HCL 5 MG/5ML IJ SOLN
INTRAMUSCULAR | Status: DC | PRN
  Administered 2019-11-19: 2 mg via INTRAVENOUS

## 2019-11-19 MED ORDER — 0.9 % SODIUM CHLORIDE (POUR BTL) OPTIME
TOPICAL | Status: DC | PRN
  Administered 2019-11-19: 1000 mL

## 2019-11-19 MED ORDER — FENTANYL CITRATE (PF) 100 MCG/2ML IJ SOLN
INTRAMUSCULAR | Status: DC | PRN
  Administered 2019-11-19 (×2): 25 ug via INTRAVENOUS

## 2019-11-19 MED ORDER — HYDROMORPHONE HCL 1 MG/ML IJ SOLN: 0.2500 mg | INTRAMUSCULAR | Status: DC | PRN

## 2019-11-19 MED ORDER — BUPIVACAINE-EPINEPHRINE 0.5% -1:200000 IJ SOLN
INTRAMUSCULAR | Status: DC | PRN
  Administered 2019-11-19: 6 mL

## 2019-11-19 MED ORDER — PHENYLEPHRINE 40 MCG/ML (10ML) SYRINGE FOR IV PUSH (FOR BLOOD PRESSURE SUPPORT)
PREFILLED_SYRINGE | INTRAVENOUS | Status: AC
  Filled 2019-11-19: qty 10

## 2019-11-19 MED ORDER — BUPIVACAINE-EPINEPHRINE 0.5% -1:200000 IJ SOLN
INTRAMUSCULAR | Status: AC
  Filled 2019-11-19: qty 1

## 2019-11-19 MED ORDER — ONDANSETRON HCL 4 MG/2ML IJ SOLN
INTRAMUSCULAR | Status: AC
  Filled 2019-11-19: qty 2

## 2019-11-19 MED ORDER — ONDANSETRON HCL 4 MG/2ML IJ SOLN
INTRAMUSCULAR | Status: DC | PRN
  Administered 2019-11-19: 4 mg via INTRAVENOUS

## 2019-11-19 MED ORDER — PROPOFOL 10 MG/ML IV BOLUS
INTRAVENOUS | Status: DC | PRN
  Administered 2019-11-19: 20 mg via INTRAVENOUS

## 2019-11-19 MED ORDER — HYDROCODONE-ACETAMINOPHEN 5-325 MG PO TABS: 1.0000 | ORAL_TABLET | Freq: Three times a day (TID) | ORAL | 0 refills | Status: DC | PRN

## 2019-11-19 MED ORDER — PROPOFOL 10 MG/ML IV BOLUS
INTRAVENOUS | Status: AC
  Filled 2019-11-19: qty 20

## 2019-11-19 MED ORDER — FENTANYL CITRATE (PF) 250 MCG/5ML IJ SOLN
INTRAMUSCULAR | Status: AC
  Filled 2019-11-19: qty 5

## 2019-11-19 MED ORDER — HYDROCODONE-ACETAMINOPHEN 7.5-325 MG PO TABS: 1.0000 | ORAL_TABLET | Freq: Once | ORAL | Status: DC | PRN

## 2019-11-19 MED ORDER — PROMETHAZINE HCL 25 MG/ML IJ SOLN: 6.2500 mg | INTRAMUSCULAR | Status: DC | PRN

## 2019-11-19 MED ORDER — MEPERIDINE HCL 25 MG/ML IJ SOLN: 6.2500 mg | INTRAMUSCULAR | Status: DC | PRN

## 2019-11-19 SURGICAL SUPPLY — 62 items
BANDAGE ESMARK 6X9 LF (GAUZE/BANDAGES/DRESSINGS) ×1 IMPLANT
BNDG COHESIVE 6X5 TAN STRL LF (GAUZE/BANDAGES/DRESSINGS) ×3 IMPLANT
BNDG ELASTIC 4X5.8 VLCR STR LF (GAUZE/BANDAGES/DRESSINGS) ×3 IMPLANT
BNDG ELASTIC 6X5.8 VLCR STR LF (GAUZE/BANDAGES/DRESSINGS) ×3 IMPLANT
BNDG ESMARK 6X9 LF (GAUZE/BANDAGES/DRESSINGS) ×3
BNDG GAUZE ELAST 4 BULKY (GAUZE/BANDAGES/DRESSINGS) ×6 IMPLANT
BRUSH SCRUB EZ PLAIN DRY (MISCELLANEOUS) ×4 IMPLANT
CLOSURE WOUND 1/2 X4 (GAUZE/BANDAGES/DRESSINGS)
COVER SURGICAL LIGHT HANDLE (MISCELLANEOUS) ×6 IMPLANT
COVER WAND RF STERILE (DRAPES) ×3 IMPLANT
CUFF TOURN SGL QUICK 18X4 (TOURNIQUET CUFF) IMPLANT
CUFF TOURN SGL QUICK 24 (TOURNIQUET CUFF)
CUFF TOURN SGL QUICK 34 (TOURNIQUET CUFF)
CUFF TRNQT CYL 24X4X16.5-23 (TOURNIQUET CUFF) IMPLANT
CUFF TRNQT CYL 34X4.125X (TOURNIQUET CUFF) IMPLANT
DRAPE C-ARM 42X72 X-RAY (DRAPES) IMPLANT
DRAPE C-ARMOR (DRAPES) ×3 IMPLANT
DRAPE U-SHAPE 47X51 STRL (DRAPES) ×3 IMPLANT
DRSG ADAPTIC 3X8 NADH LF (GAUZE/BANDAGES/DRESSINGS) ×3 IMPLANT
DRSG EMULSION OIL 3X3 NADH (GAUZE/BANDAGES/DRESSINGS) ×2 IMPLANT
ELECT REM PT RETURN 9FT ADLT (ELECTROSURGICAL) ×3
ELECTRODE REM PT RTRN 9FT ADLT (ELECTROSURGICAL) ×1 IMPLANT
GAUZE SPONGE 4X4 12PLY STRL (GAUZE/BANDAGES/DRESSINGS) ×3 IMPLANT
GLOVE BIO SURGEON STRL SZ7.5 (GLOVE) ×3 IMPLANT
GLOVE BIO SURGEON STRL SZ8 (GLOVE) ×3 IMPLANT
GLOVE BIOGEL PI IND STRL 7.5 (GLOVE) ×1 IMPLANT
GLOVE BIOGEL PI IND STRL 8 (GLOVE) ×1 IMPLANT
GLOVE BIOGEL PI INDICATOR 7.5 (GLOVE) ×2
GLOVE BIOGEL PI INDICATOR 8 (GLOVE) ×2
GOWN STRL REUS W/ TWL LRG LVL3 (GOWN DISPOSABLE) ×2 IMPLANT
GOWN STRL REUS W/ TWL XL LVL3 (GOWN DISPOSABLE) ×1 IMPLANT
GOWN STRL REUS W/TWL LRG LVL3 (GOWN DISPOSABLE) ×4
GOWN STRL REUS W/TWL XL LVL3 (GOWN DISPOSABLE) ×2
KIT BASIN OR (CUSTOM PROCEDURE TRAY) ×3 IMPLANT
KIT TURNOVER KIT B (KITS) ×3 IMPLANT
MANIFOLD NEPTUNE II (INSTRUMENTS) ×3 IMPLANT
NEEDLE 22X1 1/2 (OR ONLY) (NEEDLE) IMPLANT
NS IRRIG 1000ML POUR BTL (IV SOLUTION) ×3 IMPLANT
PACK ORTHO EXTREMITY (CUSTOM PROCEDURE TRAY) ×3 IMPLANT
PAD ABD 7.5X8 STRL (GAUZE/BANDAGES/DRESSINGS) ×2 IMPLANT
PAD ARMBOARD 7.5X6 YLW CONV (MISCELLANEOUS) ×6 IMPLANT
PADDING CAST COTTON 6X4 STRL (CAST SUPPLIES) ×9 IMPLANT
SPONGE LAP 18X18 RF (DISPOSABLE) ×3 IMPLANT
STAPLER VISISTAT 35W (STAPLE) IMPLANT
STOCKINETTE IMPERVIOUS LG (DRAPES) ×3 IMPLANT
STRIP CLOSURE SKIN 1/2X4 (GAUZE/BANDAGES/DRESSINGS) IMPLANT
SUCTION FRAZIER HANDLE 10FR (MISCELLANEOUS)
SUCTION TUBE FRAZIER 10FR DISP (MISCELLANEOUS) IMPLANT
SUT ETHILON 3 0 PS 1 (SUTURE) IMPLANT
SUT PDS AB 2-0 CT1 27 (SUTURE) IMPLANT
SUT VIC AB 0 CT1 27 (SUTURE)
SUT VIC AB 0 CT1 27XBRD ANBCTR (SUTURE) IMPLANT
SUT VIC AB 2-0 CT1 27 (SUTURE)
SUT VIC AB 2-0 CT1 TAPERPNT 27 (SUTURE) IMPLANT
SYR CONTROL 10ML LL (SYRINGE) IMPLANT
TOWEL GREEN STERILE (TOWEL DISPOSABLE) ×6 IMPLANT
TOWEL GREEN STERILE FF (TOWEL DISPOSABLE) ×6 IMPLANT
TUBE CONNECTING 12'X1/4 (SUCTIONS) ×1
TUBE CONNECTING 12X1/4 (SUCTIONS) ×2 IMPLANT
UNDERPAD 30X30 (UNDERPADS AND DIAPERS) ×3 IMPLANT
WATER STERILE IRR 1000ML POUR (IV SOLUTION) ×6 IMPLANT
YANKAUER SUCT BULB TIP NO VENT (SUCTIONS) ×3 IMPLANT

## 2019-11-19 NOTE — Transfer of Care (Signed)
Immediate Anesthesia Transfer of Care Note  Patient: Travis Benson  Procedure(s) Performed: HARDWARE REMOVAL LEFT TIBIA (Left )  Patient Location: PACU  Anesthesia Type:MAC  Level of Consciousness: awake, alert  and oriented  Airway & Oxygen Therapy: Patient Spontanous Breathing  Post-op Assessment: Report given to RN and Post -op Vital signs reviewed and stable  Post vital signs: Reviewed and stable  Last Vitals:  Vitals Value Taken Time  BP 135/80 11/19/19 0851  Temp    Pulse 77 11/19/19 0852  Resp 22 11/19/19 0852  SpO2 96 % 11/19/19 0852  Vitals shown include unvalidated device data.  Last Pain: There were no vitals filed for this visit.       Complications: No apparent anesthesia complications

## 2019-11-19 NOTE — Discharge Instructions (Addendum)
DISCHARGE WOUND CARE INSTRUCTIONS  Start daily wound care on 11/21/2019  Discharge Wound Care Instructions  Do NOT apply any ointments, solutions or lotions to pin sites or surgical wounds.  These prevent needed drainage and even though solutions like hydrogen peroxide kill bacteria, they also damage cells lining the pin sites that help fight infection.  Applying lotions or ointments can keep the wounds moist and can cause them to breakdown and open up as well. This can increase the risk for infection. When in doubt call the office.  Surgical incisions should be dressed daily.  If any drainage is noted, use one layer of adaptic, then gauze, Kerlix, and an ace wrap. Can also use 4x4s and tape   Once the incision is completely dry and without drainage, it may be left open to air out.  Showering may begin 36-48 hours later.  Cleaning gently with soap and water.  Traumatic wounds should be dressed daily as well.    One layer of adaptic, gauze, Kerlix, then ace wrap.  The adaptic can be discontinued once the draining has ceased    If you have a wet to dry dressing: wet the gauze with saline the squeeze as much saline out so the gauze is moist (not soaking wet), place moistened gauze over wound, then place a dry gauze over the moist one, followed by Kerlix wrap, then ace wrap.  CALL OFFICE WITH QUESTIONS OR CONCERNS [phone]  Call office for the following:  Temperature greater than 101F  Persistent nausea and vomiting  Severe uncontrolled pain  Redness, tenderness, or signs of infection (pain, swelling, redness, odor or green/yellow discharge around the site)  Difficulty breathing, headache or visual disturbances  Hives  Persistent dizziness or light-headedness  Extreme fatigue  Any other questions or concerns you may have after discharge  In an emergency, call 911 or go to an Emergency Department at a nearby hospital    [place]:  [phone]   VISIT OUR WEBSITE FOR ADDITIONAL INFORMATION: [email or web address]

## 2019-11-19 NOTE — Anesthesia Procedure Notes (Signed)
Procedure Name: MAC Date/Time: 11/19/2019 8:15 AM Performed by: Inda Coke, CRNA Pre-anesthesia Checklist: Patient identified, Emergency Drugs available, Suction available, Timeout performed and Patient being monitored Patient Re-evaluated:Patient Re-evaluated prior to induction Oxygen Delivery Method: Simple face mask Induction Type: IV induction Dental Injury: Teeth and Oropharynx as per pre-operative assessment

## 2019-11-19 NOTE — Anesthesia Postprocedure Evaluation (Signed)
Anesthesia Post Note  Patient: Travis Benson  Procedure(s) Performed: HARDWARE REMOVAL LEFT TIBIA (Left )     Patient location during evaluation: PACU Anesthesia Type: General Level of consciousness: awake and alert Pain management: pain level controlled Vital Signs Assessment: post-procedure vital signs reviewed and stable Respiratory status: spontaneous breathing, nonlabored ventilation, respiratory function stable and patient connected to nasal cannula oxygen Cardiovascular status: stable and blood pressure returned to baseline Postop Assessment: no apparent nausea or vomiting Anesthetic complications: no    Last Vitals:  Vitals:   11/19/19 0850 11/19/19 0858  BP: 135/80 (!) 139/92  Pulse: 82 69  Resp: (!) 22 16  Temp: 36.6 C   SpO2: 96% 98%    Last Pain:  Vitals:   11/19/19 0850  PainSc: 0-No pain                 [name]

## 2019-11-19 NOTE — Anesthesia Preprocedure Evaluation (Signed)
Anesthesia Evaluation  Patient identified by MRN, date of birth, ID bandGeneral Assessment Comment:Crazy dreams  Reviewed: Allergy & Precautions, NPO status , Patient's Chart, lab work & pertinent test results  Airway Mallampati: II  TM Distance: >3 FB Neck ROM: Full    Dental no notable dental hx. (+) Teeth Intact   Pulmonary neg pulmonary ROS,    Pulmonary exam normal breath sounds clear to auscultation       Cardiovascular hypertension, + CAD  Normal cardiovascular exam Rhythm:Regular Rate:Normal     Neuro/Psych  Neuromuscular disease negative psych ROS   GI/Hepatic negative GI ROS, (+) Hepatitis -, C  Endo/Other  negative endocrine ROS  Renal/GU K+ 4.1 Cr 1.24     Musculoskeletal  (+) Arthritis ,   Abdominal   Peds  Hematology Hgb 15.3 Cr 507   Anesthesia Other Findings   Reproductive/Obstetrics                            Anesthesia Physical Anesthesia Plan  ASA: III  Anesthesia Plan: General   Post-op Pain Management:    Induction: Intravenous  PONV Risk Score and Plan: 3 and Treatment may vary due to age or medical condition, Ondansetron and Dexamethasone  Airway Management Planned: Oral ETT  Additional Equipment: None  Intra-op Plan:   Post-operative Plan: Extubation in OR  Informed Consent:     Dental advisory given  Plan Discussed with:   Anesthesia Plan Comments:         Anesthesia Quick Evaluation

## 2019-11-21 LAB — HEPATITIS B SURFACE ANTIBODY,QUALITATIVE: Hep B S Ab: NONREACTIVE

## 2019-11-21 LAB — HEPATITIS C RNA QUANTITATIVE
HCV Quantitative Log: <1.18 Log IU/mL
HCV RNA, PCR, QN: <15 IU/mL

## 2019-11-21 NOTE — Op Note (Signed)
11/19/2019  6:40 PM  PATIENT:  Travis Benson  58 y.o. male  PRE-OPERATIVE DIAGNOSIS:  SYMPTOMATIC HARDWARE LEFT TIBIA  POST-OPERATIVE DIAGNOSIS:  SYMPTOMATIC HARDWARE LEFT TIBIA  PROCEDURE:  Procedure(s): HARDWARE REMOVAL LEFT TIBIA (Left)  SURGEON:  Surgeon(s) and Role:    Altamese Trent Woods, MD - Primary  PHYSICIAN ASSISTANT: Ainsley Spinner, PA-C  ANESTHESIA:   local and IV sedation  I/O:  No intake/output data recorded.  SPECIMEN:  None  TOURNIQUET:  * No tourniquets in log *  DICTATION: Epic  DISPOSITION: PACU  CONDITION: Stable  BRIEF SUMMARY OF INDICATION FOR PROCEDURE:  Patient is a pleasant 58 y.o. who underwent surgical repair of left tibia fracture with subsequent healing. Despite conservative measures, hardware related symptoms have persisted. Therefore, I discussed with the patient the risks and benefits of surgical removal including infection, nerve or vessel injury, failure to alleviate symptoms, occult nonunion, re-fracture, DVT, PE, and multiple others. Consent was provided to proceed.  BRIEF SUMMARY OF PROCEDURE:  The patient was taken to the operating room after administration of 2 g of Ancef.  General anesthesia was induced. The operative extremity was prepped and draped in usual sterile fashion.  No tourniquet was used during the procedure.  C-arm was brought in to confirm position of the hardware.  I remade the old incision used for insertion and introduced a clamp into the head of the locking bolts. I then was able to engage the head of the screwdriver and remove the locking bolts without complication. Final x-rays confirmed removal of all hardware and a healed fracture. The wounds were irrigated thoroughly and closed in standard fashion with nylon. A sterile gently compressive dressing was applied.  The patient was taken to the PACU in stable condition.    PROGNOSIS: Patient will be weightbearing as tolerated with aggressive active and passive motion of the  knee and ankle. Bleeding would be anticipated. The dressing may be changed or removed in 48 hours and shower. Patient will follow up in 10 days for removal of sutures.    Astrid Divine. Marcelino Scot, M.D.

## 2019-11-22 ENCOUNTER — Encounter (HOSPITAL_COMMUNITY): Payer: Self-pay | Admitting: Orthopedic Surgery

## 2019-11-23 ENCOUNTER — Ambulatory Visit: Payer: Medicaid Other | Admitting: Physical Therapy

## 2019-11-24 ENCOUNTER — Encounter: Payer: Medicaid Other | Admitting: Physical Therapy

## 2019-11-28 ENCOUNTER — Telehealth: Payer: Self-pay | Admitting: Physical Therapy

## 2019-11-28 NOTE — Telephone Encounter (Signed)
Mr. Hartog needs a tall adult rolling walker for prosthetic gait. Can you please write order for Tall Adult Rolling Walker and place in Epic? I am available for any questions Jamey Reas, PT, DPT PT Specializing in [place] 11/28/2019@ 10:43 PM Phone:  [phone]  Fax:  [phone] [place] [address] [place], [place] [address]

## 2019-11-29 ENCOUNTER — Ambulatory Visit: Payer: Medicaid Other | Admitting: Physical Therapy

## 2019-11-29 ENCOUNTER — Encounter: Payer: Self-pay | Admitting: Physical Therapy

## 2019-11-29 ENCOUNTER — Other Ambulatory Visit: Payer: Self-pay

## 2019-11-29 DIAGNOSIS — R29898 Other symptoms and signs involving the musculoskeletal system: Secondary | ICD-10-CM | POA: Diagnosis not present

## 2019-11-29 DIAGNOSIS — M25652 Stiffness of left hip, not elsewhere classified: Secondary | ICD-10-CM | POA: Diagnosis not present

## 2019-11-29 DIAGNOSIS — M6249 Contracture of muscle, multiple sites: Secondary | ICD-10-CM | POA: Diagnosis not present

## 2019-11-29 DIAGNOSIS — R2689 Other abnormalities of gait and mobility: Secondary | ICD-10-CM | POA: Diagnosis not present

## 2019-11-29 DIAGNOSIS — M25612 Stiffness of left shoulder, not elsewhere classified: Secondary | ICD-10-CM | POA: Diagnosis not present

## 2019-11-29 DIAGNOSIS — R293 Abnormal posture: Secondary | ICD-10-CM | POA: Diagnosis not present

## 2019-11-29 DIAGNOSIS — M25672 Stiffness of left ankle, not elsewhere classified: Secondary | ICD-10-CM | POA: Diagnosis not present

## 2019-11-29 DIAGNOSIS — M6281 Muscle weakness (generalized): Secondary | ICD-10-CM | POA: Diagnosis not present

## 2019-11-29 DIAGNOSIS — R2681 Unsteadiness on feet: Secondary | ICD-10-CM | POA: Diagnosis not present

## 2019-11-29 DIAGNOSIS — M25651 Stiffness of right hip, not elsewhere classified: Secondary | ICD-10-CM | POA: Diagnosis not present
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
## 2019-12-01 ENCOUNTER — Ambulatory Visit: Payer: Medicaid Other | Attending: Family Medicine | Admitting: Physical Therapy

## 2019-12-01 ENCOUNTER — Other Ambulatory Visit: Payer: Self-pay

## 2019-12-01 ENCOUNTER — Encounter: Payer: Self-pay | Admitting: Physical Therapy

## 2019-12-01 DIAGNOSIS — R2689 Other abnormalities of gait and mobility: Secondary | ICD-10-CM | POA: Insufficient documentation

## 2019-12-01 DIAGNOSIS — R2681 Unsteadiness on feet: Secondary | ICD-10-CM | POA: Insufficient documentation

## 2019-12-01 DIAGNOSIS — R293 Abnormal posture: Secondary | ICD-10-CM | POA: Insufficient documentation

## 2019-12-01 DIAGNOSIS — R29898 Other symptoms and signs involving the musculoskeletal system: Secondary | ICD-10-CM | POA: Insufficient documentation

## 2019-12-01 DIAGNOSIS — M6281 Muscle weakness (generalized): Secondary | ICD-10-CM | POA: Diagnosis not present
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
## 2019-12-03 ENCOUNTER — Other Ambulatory Visit: Payer: Self-pay | Admitting: Orthopedic Surgery

## 2019-12-03 DIAGNOSIS — Z89511 Acquired absence of right leg below knee: Secondary | ICD-10-CM

## 2019-12-06 ENCOUNTER — Encounter: Payer: Self-pay | Admitting: Physical Therapy

## 2019-12-06 ENCOUNTER — Ambulatory Visit: Payer: Medicaid Other | Admitting: Physical Therapy

## 2019-12-06 ENCOUNTER — Other Ambulatory Visit: Payer: Self-pay

## 2019-12-06 DIAGNOSIS — R293 Abnormal posture: Secondary | ICD-10-CM | POA: Diagnosis not present

## 2019-12-06 DIAGNOSIS — R2689 Other abnormalities of gait and mobility: Secondary | ICD-10-CM | POA: Diagnosis not present

## 2019-12-06 DIAGNOSIS — S82202F Unspecified fracture of shaft of left tibia, subsequent encounter for open fracture type IIIA, IIIB, or IIIC with routine healing: Secondary | ICD-10-CM | POA: Diagnosis not present

## 2019-12-06 DIAGNOSIS — T8484XD Pain due to internal orthopedic prosthetic devices, implants and grafts, subsequent encounter: Secondary | ICD-10-CM | POA: Diagnosis not present

## 2019-12-06 DIAGNOSIS — M6281 Muscle weakness (generalized): Secondary | ICD-10-CM | POA: Diagnosis not present

## 2019-12-06 DIAGNOSIS — R2681 Unsteadiness on feet: Secondary | ICD-10-CM | POA: Diagnosis not present

## 2019-12-06 DIAGNOSIS — R29898 Other symptoms and signs involving the musculoskeletal system: Secondary | ICD-10-CM | POA: Diagnosis not present
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
## 2019-12-08 ENCOUNTER — Ambulatory Visit: Payer: Medicaid Other | Admitting: Physical Therapy

## 2019-12-08 ENCOUNTER — Other Ambulatory Visit: Payer: Self-pay

## 2019-12-08 ENCOUNTER — Encounter: Payer: Self-pay | Admitting: Physical Therapy

## 2019-12-08 DIAGNOSIS — R2689 Other abnormalities of gait and mobility: Secondary | ICD-10-CM | POA: Diagnosis not present

## 2019-12-08 DIAGNOSIS — M6281 Muscle weakness (generalized): Secondary | ICD-10-CM | POA: Diagnosis not present

## 2019-12-08 DIAGNOSIS — R293 Abnormal posture: Secondary | ICD-10-CM

## 2019-12-08 DIAGNOSIS — R2681 Unsteadiness on feet: Secondary | ICD-10-CM | POA: Diagnosis not present

## 2019-12-08 DIAGNOSIS — Z89511 Acquired absence of right leg below knee: Secondary | ICD-10-CM | POA: Diagnosis not present

## 2019-12-08 DIAGNOSIS — R29898 Other symptoms and signs involving the musculoskeletal system: Secondary | ICD-10-CM | POA: Diagnosis not present
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
## 2019-12-14 ENCOUNTER — Ambulatory Visit: Payer: Medicaid Other | Admitting: Physical Therapy

## 2019-12-17 ENCOUNTER — Ambulatory Visit: Payer: Medicaid Other | Admitting: Physical Therapy

## 2019-12-17 ENCOUNTER — Encounter: Payer: Self-pay | Admitting: Physical Therapy

## 2019-12-17 ENCOUNTER — Other Ambulatory Visit: Payer: Self-pay

## 2019-12-17 DIAGNOSIS — M6281 Muscle weakness (generalized): Secondary | ICD-10-CM

## 2019-12-17 DIAGNOSIS — R2681 Unsteadiness on feet: Secondary | ICD-10-CM | POA: Diagnosis not present

## 2019-12-17 DIAGNOSIS — R2689 Other abnormalities of gait and mobility: Secondary | ICD-10-CM | POA: Diagnosis not present

## 2019-12-17 DIAGNOSIS — R293 Abnormal posture: Secondary | ICD-10-CM | POA: Diagnosis not present

## 2019-12-17 DIAGNOSIS — R29898 Other symptoms and signs involving the musculoskeletal system: Secondary | ICD-10-CM | POA: Diagnosis not present

## 2019-12-17 NOTE — Patient Instructions (Signed)
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
## 2019-12-20 ENCOUNTER — Other Ambulatory Visit: Payer: Self-pay

## 2019-12-20 ENCOUNTER — Ambulatory Visit: Payer: Medicaid Other | Admitting: Physical Therapy

## 2019-12-20 ENCOUNTER — Encounter: Payer: Self-pay | Admitting: Physical Therapy

## 2019-12-20 DIAGNOSIS — R29898 Other symptoms and signs involving the musculoskeletal system: Secondary | ICD-10-CM | POA: Diagnosis not present

## 2019-12-20 DIAGNOSIS — R2681 Unsteadiness on feet: Secondary | ICD-10-CM

## 2019-12-20 DIAGNOSIS — R293 Abnormal posture: Secondary | ICD-10-CM | POA: Diagnosis not present

## 2019-12-20 DIAGNOSIS — R2689 Other abnormalities of gait and mobility: Secondary | ICD-10-CM | POA: Diagnosis not present

## 2019-12-20 DIAGNOSIS — M6281 Muscle weakness (generalized): Secondary | ICD-10-CM | POA: Diagnosis not present
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
## 2019-12-22 ENCOUNTER — Ambulatory Visit: Payer: Medicaid Other | Admitting: Physical Therapy

## 2019-12-22 ENCOUNTER — Encounter: Payer: Self-pay | Admitting: Physical Therapy

## 2019-12-22 ENCOUNTER — Other Ambulatory Visit: Payer: Self-pay

## 2019-12-22 DIAGNOSIS — R2689 Other abnormalities of gait and mobility: Secondary | ICD-10-CM

## 2019-12-22 DIAGNOSIS — R293 Abnormal posture: Secondary | ICD-10-CM

## 2019-12-22 DIAGNOSIS — R2681 Unsteadiness on feet: Secondary | ICD-10-CM | POA: Diagnosis not present

## 2019-12-22 DIAGNOSIS — R29898 Other symptoms and signs involving the musculoskeletal system: Secondary | ICD-10-CM

## 2019-12-22 DIAGNOSIS — M6281 Muscle weakness (generalized): Secondary | ICD-10-CM | POA: Diagnosis not present
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
## 2019-12-28 ENCOUNTER — Other Ambulatory Visit: Payer: Self-pay

## 2019-12-28 ENCOUNTER — Encounter: Payer: Self-pay | Admitting: Physical Therapy

## 2019-12-28 ENCOUNTER — Ambulatory Visit: Payer: Medicaid Other | Admitting: Physical Therapy

## 2019-12-28 DIAGNOSIS — R29898 Other symptoms and signs involving the musculoskeletal system: Secondary | ICD-10-CM

## 2019-12-28 DIAGNOSIS — R2681 Unsteadiness on feet: Secondary | ICD-10-CM | POA: Diagnosis not present

## 2019-12-28 DIAGNOSIS — M6281 Muscle weakness (generalized): Secondary | ICD-10-CM

## 2019-12-28 DIAGNOSIS — R2689 Other abnormalities of gait and mobility: Secondary | ICD-10-CM

## 2019-12-28 DIAGNOSIS — R293 Abnormal posture: Secondary | ICD-10-CM | POA: Diagnosis not present
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
## 2019-12-29 ENCOUNTER — Ambulatory Visit: Payer: Medicaid Other | Admitting: Physical Therapy

## 2020-01-06 ENCOUNTER — Other Ambulatory Visit: Payer: Self-pay

## 2020-01-06 ENCOUNTER — Encounter: Payer: Self-pay | Admitting: Physical Therapy

## 2020-01-06 ENCOUNTER — Ambulatory Visit: Payer: Medicaid Other | Attending: Family Medicine | Admitting: Physical Therapy

## 2020-01-06 DIAGNOSIS — M6249 Contracture of muscle, multiple sites: Secondary | ICD-10-CM | POA: Diagnosis not present

## 2020-01-06 DIAGNOSIS — R2681 Unsteadiness on feet: Secondary | ICD-10-CM | POA: Insufficient documentation

## 2020-01-06 DIAGNOSIS — R2689 Other abnormalities of gait and mobility: Secondary | ICD-10-CM | POA: Insufficient documentation

## 2020-01-06 DIAGNOSIS — M6281 Muscle weakness (generalized): Secondary | ICD-10-CM | POA: Diagnosis not present

## 2020-01-06 DIAGNOSIS — R293 Abnormal posture: Secondary | ICD-10-CM | POA: Diagnosis not present

## 2020-01-06 DIAGNOSIS — R29898 Other symptoms and signs involving the musculoskeletal system: Secondary | ICD-10-CM | POA: Insufficient documentation
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
## 2020-01-12 ENCOUNTER — Encounter: Payer: Self-pay | Admitting: Physical Therapy

## 2020-01-12 ENCOUNTER — Other Ambulatory Visit: Payer: Self-pay

## 2020-01-12 ENCOUNTER — Ambulatory Visit: Payer: Medicaid Other | Admitting: Physical Therapy

## 2020-01-12 DIAGNOSIS — M6281 Muscle weakness (generalized): Secondary | ICD-10-CM | POA: Diagnosis not present

## 2020-01-12 DIAGNOSIS — R293 Abnormal posture: Secondary | ICD-10-CM

## 2020-01-12 DIAGNOSIS — R29898 Other symptoms and signs involving the musculoskeletal system: Secondary | ICD-10-CM | POA: Diagnosis not present

## 2020-01-12 DIAGNOSIS — R2689 Other abnormalities of gait and mobility: Secondary | ICD-10-CM

## 2020-01-12 DIAGNOSIS — M6249 Contracture of muscle, multiple sites: Secondary | ICD-10-CM

## 2020-01-12 DIAGNOSIS — R2681 Unsteadiness on feet: Secondary | ICD-10-CM
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
## 2020-01-14 ENCOUNTER — Other Ambulatory Visit: Payer: Self-pay | Admitting: Pharmacist

## 2020-01-14 DIAGNOSIS — B182 Chronic viral hepatitis C: Secondary | ICD-10-CM

## 2020-01-14 MED FILL — AMLODIPINE BESYLATE 5 MG TA: 5 | 30 days supply | Qty: 30 | Fill #2

## 2020-01-14 MED FILL — GABAPENTIN 300 MG CAPSULE: 300 | 30 days supply | Qty: 120 | Fill #2

## 2020-01-19 ENCOUNTER — Ambulatory Visit: Payer: Medicaid Other | Admitting: Physical Therapy

## 2020-01-27 ENCOUNTER — Encounter: Payer: Medicaid Other | Admitting: Physical Therapy

## 2020-02-02 ENCOUNTER — Ambulatory Visit: Payer: Medicaid Other | Admitting: Physical Therapy

## 2020-02-04 ENCOUNTER — Ambulatory Visit: Payer: Medicaid Other | Admitting: Physical Therapy

## 2020-02-07 ENCOUNTER — Ambulatory Visit: Payer: Medicaid Other | Attending: Family Medicine | Admitting: Physical Therapy

## 2020-02-07 ENCOUNTER — Encounter: Payer: Self-pay | Admitting: Family Medicine

## 2020-02-07 ENCOUNTER — Other Ambulatory Visit: Payer: Self-pay

## 2020-02-07 ENCOUNTER — Encounter: Payer: Self-pay | Admitting: Physical Therapy

## 2020-02-07 ENCOUNTER — Ambulatory Visit: Payer: Medicaid Other | Attending: Family Medicine | Admitting: Family Medicine

## 2020-02-07 DIAGNOSIS — R21 Rash and other nonspecific skin eruption: Secondary | ICD-10-CM | POA: Diagnosis not present

## 2020-02-07 DIAGNOSIS — R2689 Other abnormalities of gait and mobility: Secondary | ICD-10-CM | POA: Insufficient documentation

## 2020-02-07 DIAGNOSIS — R293 Abnormal posture: Secondary | ICD-10-CM | POA: Insufficient documentation

## 2020-02-07 DIAGNOSIS — I2510 Atherosclerotic heart disease of native coronary artery without angina pectoris: Secondary | ICD-10-CM

## 2020-02-07 DIAGNOSIS — R2681 Unsteadiness on feet: Secondary | ICD-10-CM | POA: Diagnosis not present

## 2020-02-07 DIAGNOSIS — M6281 Muscle weakness (generalized): Secondary | ICD-10-CM | POA: Diagnosis not present

## 2020-02-07 DIAGNOSIS — M6249 Contracture of muscle, multiple sites: Secondary | ICD-10-CM | POA: Insufficient documentation

## 2020-02-07 DIAGNOSIS — R29898 Other symptoms and signs involving the musculoskeletal system: Secondary | ICD-10-CM | POA: Insufficient documentation

## 2020-02-07 MED ORDER — TRIAMCINOLONE ACETONIDE 0.1 % EX CREA: 1.0000 "application " | TOPICAL_CREAM | Freq: Two times a day (BID) | CUTANEOUS | 0 refills | Status: DC

## 2020-02-07 MED ORDER — NYSTATIN 100000 UNIT/GM EX CREA: 1.0000 "application " | TOPICAL_CREAM | Freq: Two times a day (BID) | CUTANEOUS | 3 refills | Status: DC

## 2020-02-07 MED FILL — TRIAMCINOLONE ACETONIDE 0.1: 0.1 | 15 days supply | Qty: 30 | Fill #0

## 2020-02-07 MED FILL — NYSTATIN 100,000 UNIT/GM CR: 100000 | 15 days supply | Qty: 30 | Fill #0
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
## 2020-02-07 NOTE — Progress Notes (Signed)
Virtual Visit via Telephone Note  I connected with Travis Benson on 02/07/20 at  1:50 PM EST by telephone and verified that I am speaking with the correct person using two identifiers.   I discussed the limitations, risks, security and privacy concerns of performing an evaluation and management service by telephone and the availability of in person appointments. I also discussed with the patient that there may be a patient responsible charge related to this service. The patient expressed understanding and agreed to proceed.  Patient Location: Home Provider Location: [facility] Others participating in call: none  First call placed to the listed number at 2:14 pm and call went to voicemail- C. Fahed Morten, MD Second call placed  At 2:19 pm and patient answered   History of Present Illness:      47 male with the complaint of irritation in his groin area and he would like a prescription for triamcinolone which he was prescribed in the past.  He believes that the triamcinolone cream did help.  He also reports that he is having a lot of itching both in the groin area as well as at the area of his amputation where he places the shrinker onto the amputation site.  He has not seen any actual redness or skin changes but feels that the area is very irritated because the area is very itchy.        He also reports that he now has Medicaid and would like to see a cardiologist due to family history of CAD as his father was scheduled to have coronary artery bypass surgery but died prior to the procedure.  Patient has also been told in the past that he has heart disease.  He denies any current issues with chest pain or palpitations, no shortness of breath or cough and no excessive fatigue.  Past Medical History:  Diagnosis Date  . Arthritis    neck  . Complication of anesthesia    "crazy dreams"  . Coronary artery disease   . Head injury with loss of consciousness (HCC)   . History of hepatitis C   .  Hypertension   . 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 Left scapula fracture 03/02/2019  . Open left tibial fracture 03/02/2019  . Pelvic ring fracture (HCC), Right  03/02/2019  . Right knee dislocation 03/02/2019  . Unspecified injury of popliteal artery, right leg, initial encounter 03/02/2019    Past Surgical History:  Procedure Laterality Date  . AMPUTATION Right 04/13/2019   Procedure: AMPUTATION ABOVE KNEE;  Surgeon: Myrene Galas, MD;  Location: [facility] OR;  Service: Orthopedics;  Laterality: Right;  . APPLICATION OF WOUND VAC Bilateral 03/05/2019   Procedure: WOUND VAC CHANGE RIGHT LOWER LEG; REMOVAL OF WOUND VAC LEFT LOWER LEG WITH APPLICATION OF DRESSINGS;  Surgeon: Larina Earthly, MD;  [facility] OR;  Service: Vascular;  Laterality: Bilateral;  . APPLICATION OF WOUND VAC Right 04/09/2019   Procedure: APPLICATION OF WOUND VAC;  Surgeon: Myrene Galas, MD;  [facility] OR;  Service: Orthopedics;  Laterality: Right;  . APPLICATION OF WOUND VAC Bilateral 03/02/2019   Procedure: Application Of Wound Vac;  Surgeon: Myrene Galas, MD;  Location: [facility] OR;  Service: Orthopedics;  Laterality: Bilateral;  . APPLICATION OF WOUND VAC Right 03/02/2019   Procedure: Wound Vac dressing removal;  Surgeon: Maeola Harman, MD;  Location: [facility] OR;  Service: Vascular;  Laterality: Right;  . EXTERNAL FIXATION LEG Right 03/02/2019   Procedure: External Fixation  Leg;  Surgeon: Altamese Star, MD;  Location: [place];  Service: Orthopedics;  Laterality: Right;  . EXTERNAL FIXATION REMOVAL Left 03/02/2019   Procedure: REMOVAL EXTERNAL FIXATION LEG;  Surgeon: Altamese [place], MD;  Location: [place];  Service: Orthopedics;  Laterality: Left;  . FASCIOTOMY Right 02/28/2019   Procedure: FOUR COMPARTMENT FASCIOTOMY OF RIGHT LOWER LEG;  Surgeon: Waynetta Sandy, MD;  Location: [place];  Service: Vascular;  Laterality: Right;  . FEMORAL-POPLITEAL BYPASS GRAFT Right 02/28/2019   Procedure: BYPASS GRAFT OF ABOVE KNEE  POPLITEAL- BELOW KNEE POPLITEAL ARTERY;  Surgeon: Waynetta Sandy, MD;  Location: [place];  Service: Vascular;  Laterality: Right;  . FEMUR IM NAIL Left 03/02/2019   Procedure: INTRAMEDULLARY (IM) RETROGRADE FEMORAL NAILING;  Surgeon: Altamese [place], MD;  Location: [place];  Service: Orthopedics;  Laterality: Left;  . HARDWARE REMOVAL Left 11/19/2019   Procedure: HARDWARE REMOVAL LEFT TIBIA;  Surgeon: Altamese [place], MD;  Location: [place];  Service: Orthopedics;  Laterality: Left;  . I & D EXTREMITY Left 02/28/2019   Procedure: IRRIGATION AND DEBRIDEMENT OF LEFT LOWER LEG /INTERNAL FIXATION OF LEFT TIBIA PLACEMENT OF FRACTURE PINLEFT FEMUR/ CLOSED REDUCTION OF LEFT FEMUR.;  Surgeon: Paralee Cancel, MD;  Location: [place];  Service: Orthopedics;  Laterality: Left;  . I & D EXTREMITY Right 04/09/2019   Procedure: IRRIGATION AND DEBRIDEMENT EXTREMITY R lower leg;  Surgeon: Altamese [place], MD;  Location: [facility];  Service: Orthopedics;  Laterality: Right;  . I & D EXTREMITY Right 04/13/2019   Procedure: IRRIGATION AND DEBRIDEMENT EXTREMITY Right Leg;  Surgeon: Altamese [place], MD;  Location: [place];  Service: Orthopedics;  Laterality: Right;  . I & D EXTREMITY Left 03/02/2019   Procedure: IRRIGATION AND DEBRIDEMENT LEG;  Surgeon: Altamese [place], MD;  Location: [place];  Service: Orthopedics;  Laterality: Left;  . ORIF PELVIC FRACTURE WITH PERCUTANEOUS SCREWS Right 03/02/2019   Procedure: Orif Pelvic Fracture With Percutaneous Screws;  Surgeon: Altamese [place], MD;  Location: [place];  Service: Orthopedics;  Laterality: Right;  . SKIN SPLIT GRAFT Right 03/26/2019   Procedure: SKIN GRAFT SPLIT THICKNESS;  Surgeon: Altamese , MD;  Location: [place];  Service: Orthopedics;  Laterality: Right;  . TIBIA IM NAIL INSERTION Left 03/02/2019   Procedure: INTRAMEDULLARY (IM) NAIL TIBIAL;  Surgeon: Altamese , MD;  Location: [place];  Service: Orthopedics;  Laterality: Left;    Family History  Problem Relation Age of Onset  .  Kidney failure Mother   . Diabetes Father     Social History   Tobacco Use  . Smoking status: Never Smoker  . Smokeless tobacco: Never Used  Substance Use Topics  . Alcohol use: Never  . Drug use: Never     No Known Allergies     Observations/Objective: No vital signs or physical exam conducted as visit was done via telephone  Assessment and Plan: 1. Rash Patient with complaint of areas of skin irritation with itching and a feels that triamcinolone has helped in the past.  New prescription for triamcinolone provided and prescription for nystatin if itching/irritation is not relieved by triamcinolone - triamcinolone cream (KENALOG) 0.1 %; Apply 1 application topically 2 (two) times daily. X 7 days to areas of skin irritation then as needed  Dispense: 30 g; Refill: 0 - nystatin cream (MYCOSTATIN); Apply 1 application topically 2 (two) times daily. X 10 days as needed for itchy rash then as needed  Dispense: 30 g; Refill: 3  2. Coronary artery disease involving native  heart, angina presence unspecified, unspecified vessel or lesion type Referral will be placed for patient to follow-up with cardiology.  He is also asked to make follow-up appointment here for 6 weeks for blood work and follow-up of CAD/chronic issues but sooner if other issues arise - Ambulatory referral to Cardiology  Follow Up Instructions:Return in about 6 weeks (around 03/20/2020) for chronic issues and labs.    I discussed the assessment and treatment plan with the patient. The patient was provided an opportunity to ask questions and all were answered. The patient agreed with the plan and demonstrated an understanding of the instructions.   The patient was advised to call back or seek an in-person evaluation if the symptoms worsen or if the condition fails to improve as anticipated.  I provided 10 minutes of non-face-to-face time during this encounter.   Cain Saupe, MD
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
## 2020-02-07 NOTE — Progress Notes (Signed)
Patient verified DOB Patient has eaten today  Patient has taken medication today. Patient denies pain at this time. Patient requesting hydrocortisone for irritated groin area.

## 2020-02-08 ENCOUNTER — Encounter: Payer: Self-pay | Admitting: Cardiology

## 2020-02-08 ENCOUNTER — Ambulatory Visit (INDEPENDENT_AMBULATORY_CARE_PROVIDER_SITE_OTHER): Payer: Medicaid Other | Admitting: Cardiology

## 2020-02-08 VITALS — BP 142/90 | HR 82 | Temp 97.3°F | Ht 72.0 in | Wt 211.1 lb

## 2020-02-08 DIAGNOSIS — Z7189 Other specified counseling: Secondary | ICD-10-CM | POA: Diagnosis not present

## 2020-02-08 DIAGNOSIS — E781 Pure hyperglyceridemia: Secondary | ICD-10-CM

## 2020-02-08 DIAGNOSIS — I10 Essential (primary) hypertension: Secondary | ICD-10-CM

## 2020-02-08 NOTE — Patient Instructions (Addendum)
Medication Instructions:  Your physician recommends that you continue on your current medications as directed. Please refer to the Current Medication list given to you today.  *If you need a refill on your cardiac medications before your next appointment, please call your pharmacy*  Lab Work: Lipids & Lpa   If you have labs (blood work) drawn today and your tests are completely normal, you will receive your results only by: [name] MyChart Message (if you have MyChart) OR . A paper copy in the mail If you have any lab test that is abnormal or we need to change your treatment, we will call you to review the results.  Testing/Procedures: Your physician has ordered for you to have a ct cardiac scoring  Follow-Up: At [facility], you and your health needs are our priority.  As part of our continuing mission to provide you with exceptional heart care, we have created designated Provider Care Teams.  These Care Teams include your primary Cardiologist (physician) and Advanced Practice Providers (APPs -  Physician Assistants and Nurse Practitioners) who all work together to provide you with the care you need, when you need it.  Your next appointment:   6 week(s)  The format for your next appointment:   In Person  Provider:   Norman Herrlich, MD  Other Instructions None

## 2020-02-09 ENCOUNTER — Other Ambulatory Visit (INDEPENDENT_AMBULATORY_CARE_PROVIDER_SITE_OTHER): Payer: Medicaid Other | Admitting: *Deleted

## 2020-02-09 ENCOUNTER — Telehealth: Payer: Self-pay | Admitting: Emergency Medicine

## 2020-02-09 DIAGNOSIS — I10 Essential (primary) hypertension: Secondary | ICD-10-CM | POA: Diagnosis not present

## 2020-02-09 DIAGNOSIS — I739 Peripheral vascular disease, unspecified: Secondary | ICD-10-CM

## 2020-02-09 DIAGNOSIS — Z7189 Other specified counseling: Secondary | ICD-10-CM | POA: Diagnosis not present

## 2020-02-09 DIAGNOSIS — E785 Hyperlipidemia, unspecified: Secondary | ICD-10-CM

## 2020-02-09 LAB — LIPID PANEL
Chol/HDL Ratio: 6.4 ratio — ABNORMAL HIGH (ref 0.0–5.0)
Cholesterol, Total: 301 mg/dL — ABNORMAL HIGH (ref 100–199)
HDL: 47 mg/dL (ref >39)
LDL Chol Calc (NIH): 216 mg/dL — ABNORMAL HIGH (ref 0–99)
Triglycerides: 194 mg/dL — ABNORMAL HIGH (ref 0–149)
VLDL Cholesterol Cal: 38 mg/dL (ref 5–40)

## 2020-02-09 LAB — LIPOPROTEIN A (LPA): Lipoprotein (a): 75.4 nmol/L — ABNORMAL HIGH (ref <75.0)

## 2020-02-09 NOTE — Telephone Encounter (Signed)
Left message for patient to return call regarding results  

## 2020-02-10 ENCOUNTER — Ambulatory Visit: Payer: Medicaid Other | Admitting: Physical Therapy

## 2020-02-10 ENCOUNTER — Other Ambulatory Visit: Payer: Self-pay

## 2020-02-10 ENCOUNTER — Encounter: Payer: Self-pay | Admitting: Physical Therapy

## 2020-02-10 DIAGNOSIS — R2689 Other abnormalities of gait and mobility: Secondary | ICD-10-CM

## 2020-02-10 DIAGNOSIS — M6281 Muscle weakness (generalized): Secondary | ICD-10-CM | POA: Diagnosis not present

## 2020-02-10 DIAGNOSIS — M6249 Contracture of muscle, multiple sites: Secondary | ICD-10-CM | POA: Diagnosis not present

## 2020-02-10 DIAGNOSIS — R29898 Other symptoms and signs involving the musculoskeletal system: Secondary | ICD-10-CM

## 2020-02-10 DIAGNOSIS — R293 Abnormal posture: Secondary | ICD-10-CM

## 2020-02-10 DIAGNOSIS — R2681 Unsteadiness on feet: Secondary | ICD-10-CM | POA: Diagnosis not present

## 2020-02-10 MED ORDER — ROSUVASTATIN CALCIUM 10 MG PO TABS: 10.0000 mg | ORAL_TABLET | Freq: Every day | ORAL | 1 refills | Status: DC

## 2020-02-10 NOTE — Addendum Note (Signed)
Addended by: Fayrene Fearing B on: 02/10/2020 04:13 PM   Modules accepted: Orders

## 2020-02-10 NOTE — Telephone Encounter (Signed)
Telephone call to patient. Informed of lab results and need to start crestor 10 mg daily then repeat CMP and lipids in 6 weeks. Pt verbalized understanding

## 2020-02-11 MED FILL — ?ROSUVASTATIN CALCIUM 10 MG: 10 | 30 days supply | Qty: 30 | Fill #0
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
## 2020-02-14 ENCOUNTER — Encounter: Payer: Self-pay | Admitting: Physical Therapy

## 2020-02-14 ENCOUNTER — Other Ambulatory Visit: Payer: Self-pay

## 2020-02-14 ENCOUNTER — Ambulatory Visit: Payer: Medicaid Other | Admitting: Physical Therapy

## 2020-02-14 DIAGNOSIS — M6249 Contracture of muscle, multiple sites: Secondary | ICD-10-CM | POA: Diagnosis not present

## 2020-02-14 DIAGNOSIS — M6281 Muscle weakness (generalized): Secondary | ICD-10-CM

## 2020-02-14 DIAGNOSIS — R29898 Other symptoms and signs involving the musculoskeletal system: Secondary | ICD-10-CM

## 2020-02-14 DIAGNOSIS — R2689 Other abnormalities of gait and mobility: Secondary | ICD-10-CM

## 2020-02-14 DIAGNOSIS — R293 Abnormal posture: Secondary | ICD-10-CM | POA: Diagnosis not present

## 2020-02-14 DIAGNOSIS — R2681 Unsteadiness on feet: Secondary | ICD-10-CM
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
## 2020-02-17 ENCOUNTER — Ambulatory Visit: Payer: Medicaid Other | Admitting: Physical Therapy

## 2020-02-17 ENCOUNTER — Other Ambulatory Visit: Payer: Medicaid Other

## 2020-02-17 ENCOUNTER — Ambulatory Visit: Payer: Medicaid Other | Admitting: Pharmacist

## 2020-02-21 ENCOUNTER — Encounter: Payer: Self-pay | Admitting: Physical Therapy

## 2020-02-21 ENCOUNTER — Ambulatory Visit: Payer: Medicaid Other | Admitting: Physical Therapy

## 2020-02-21 ENCOUNTER — Other Ambulatory Visit: Payer: Self-pay

## 2020-02-21 DIAGNOSIS — R293 Abnormal posture: Secondary | ICD-10-CM | POA: Diagnosis not present

## 2020-02-21 DIAGNOSIS — M6249 Contracture of muscle, multiple sites: Secondary | ICD-10-CM

## 2020-02-21 DIAGNOSIS — R2689 Other abnormalities of gait and mobility: Secondary | ICD-10-CM

## 2020-02-21 DIAGNOSIS — M6281 Muscle weakness (generalized): Secondary | ICD-10-CM | POA: Diagnosis not present

## 2020-02-21 DIAGNOSIS — R2681 Unsteadiness on feet: Secondary | ICD-10-CM | POA: Diagnosis not present

## 2020-02-21 DIAGNOSIS — R29898 Other symptoms and signs involving the musculoskeletal system: Secondary | ICD-10-CM

## 2020-02-22 ENCOUNTER — Other Ambulatory Visit: Payer: Medicaid Other

## 2020-02-22 DIAGNOSIS — B182 Chronic viral hepatitis C: Secondary | ICD-10-CM
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
## 2020-02-24 ENCOUNTER — Encounter: Payer: Self-pay | Admitting: Physical Therapy

## 2020-02-24 ENCOUNTER — Ambulatory Visit: Payer: Medicaid Other | Admitting: Physical Therapy

## 2020-02-24 ENCOUNTER — Other Ambulatory Visit: Payer: Self-pay

## 2020-02-24 DIAGNOSIS — R2689 Other abnormalities of gait and mobility: Secondary | ICD-10-CM | POA: Diagnosis not present

## 2020-02-24 DIAGNOSIS — R2681 Unsteadiness on feet: Secondary | ICD-10-CM | POA: Diagnosis not present

## 2020-02-24 DIAGNOSIS — R29898 Other symptoms and signs involving the musculoskeletal system: Secondary | ICD-10-CM | POA: Diagnosis not present

## 2020-02-24 DIAGNOSIS — R293 Abnormal posture: Secondary | ICD-10-CM | POA: Diagnosis not present

## 2020-02-24 DIAGNOSIS — M6281 Muscle weakness (generalized): Secondary | ICD-10-CM

## 2020-02-24 DIAGNOSIS — M6249 Contracture of muscle, multiple sites: Secondary | ICD-10-CM | POA: Diagnosis not present
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
## 2020-02-27 LAB — HEPATITIS C RNA QUANTITATIVE
HCV Quantitative Log: 1.53 Log IU/mL — ABNORMAL HIGH
HCV RNA, PCR, QN: 34 IU/mL — ABNORMAL HIGH

## 2020-03-01 ENCOUNTER — Other Ambulatory Visit: Payer: Self-pay

## 2020-03-01 ENCOUNTER — Ambulatory Visit: Payer: Medicaid Other | Attending: Family Medicine | Admitting: Physical Therapy

## 2020-03-01 ENCOUNTER — Encounter: Payer: Self-pay | Admitting: Physical Therapy

## 2020-03-01 DIAGNOSIS — M6281 Muscle weakness (generalized): Secondary | ICD-10-CM | POA: Insufficient documentation

## 2020-03-01 DIAGNOSIS — R293 Abnormal posture: Secondary | ICD-10-CM | POA: Insufficient documentation

## 2020-03-01 DIAGNOSIS — R2689 Other abnormalities of gait and mobility: Secondary | ICD-10-CM | POA: Insufficient documentation

## 2020-03-01 DIAGNOSIS — M25672 Stiffness of left ankle, not elsewhere classified: Secondary | ICD-10-CM | POA: Insufficient documentation

## 2020-03-01 DIAGNOSIS — R29898 Other symptoms and signs involving the musculoskeletal system: Secondary | ICD-10-CM | POA: Insufficient documentation

## 2020-03-01 DIAGNOSIS — Z9181 History of falling: Secondary | ICD-10-CM | POA: Insufficient documentation

## 2020-03-01 DIAGNOSIS — M25652 Stiffness of left hip, not elsewhere classified: Secondary | ICD-10-CM | POA: Diagnosis not present

## 2020-03-01 DIAGNOSIS — M25612 Stiffness of left shoulder, not elsewhere classified: Secondary | ICD-10-CM | POA: Diagnosis not present

## 2020-03-01 DIAGNOSIS — M6249 Contracture of muscle, multiple sites: Secondary | ICD-10-CM | POA: Diagnosis not present

## 2020-03-01 DIAGNOSIS — R2681 Unsteadiness on feet: Secondary | ICD-10-CM | POA: Insufficient documentation

## 2020-03-01 DIAGNOSIS — R531 Weakness: Secondary | ICD-10-CM | POA: Diagnosis not present

## 2020-03-01 DIAGNOSIS — M25651 Stiffness of right hip, not elsewhere classified: Secondary | ICD-10-CM | POA: Diagnosis not present

## 2020-03-01 DIAGNOSIS — M25662 Stiffness of left knee, not elsewhere classified: Secondary | ICD-10-CM | POA: Diagnosis not present
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
## 2020-03-03 ENCOUNTER — Other Ambulatory Visit: Payer: Self-pay

## 2020-03-03 ENCOUNTER — Encounter: Payer: Self-pay | Admitting: Physical Therapy

## 2020-03-03 ENCOUNTER — Ambulatory Visit: Payer: Medicaid Other | Admitting: Physical Therapy

## 2020-03-03 DIAGNOSIS — R531 Weakness: Secondary | ICD-10-CM | POA: Diagnosis not present

## 2020-03-03 DIAGNOSIS — M6249 Contracture of muscle, multiple sites: Secondary | ICD-10-CM | POA: Diagnosis not present

## 2020-03-03 DIAGNOSIS — R293 Abnormal posture: Secondary | ICD-10-CM

## 2020-03-03 DIAGNOSIS — M25662 Stiffness of left knee, not elsewhere classified: Secondary | ICD-10-CM | POA: Diagnosis not present

## 2020-03-03 DIAGNOSIS — R2681 Unsteadiness on feet: Secondary | ICD-10-CM

## 2020-03-03 DIAGNOSIS — M25651 Stiffness of right hip, not elsewhere classified: Secondary | ICD-10-CM | POA: Diagnosis not present

## 2020-03-03 DIAGNOSIS — M6281 Muscle weakness (generalized): Secondary | ICD-10-CM

## 2020-03-03 DIAGNOSIS — R29898 Other symptoms and signs involving the musculoskeletal system: Secondary | ICD-10-CM | POA: Diagnosis not present

## 2020-03-03 DIAGNOSIS — R2689 Other abnormalities of gait and mobility: Secondary | ICD-10-CM | POA: Diagnosis not present

## 2020-03-03 DIAGNOSIS — M25672 Stiffness of left ankle, not elsewhere classified: Secondary | ICD-10-CM | POA: Diagnosis not present

## 2020-03-03 DIAGNOSIS — M25612 Stiffness of left shoulder, not elsewhere classified: Secondary | ICD-10-CM | POA: Diagnosis not present

## 2020-03-03 DIAGNOSIS — M25652 Stiffness of left hip, not elsewhere classified: Secondary | ICD-10-CM | POA: Diagnosis not present

## 2020-03-03 DIAGNOSIS — Z9181 History of falling: Secondary | ICD-10-CM | POA: Diagnosis not present
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
## 2020-03-07 ENCOUNTER — Other Ambulatory Visit: Payer: Self-pay

## 2020-03-07 ENCOUNTER — Encounter: Payer: Self-pay | Admitting: Physical Therapy

## 2020-03-07 ENCOUNTER — Ambulatory Visit: Payer: Medicaid Other | Admitting: Physical Therapy

## 2020-03-07 DIAGNOSIS — R531 Weakness: Secondary | ICD-10-CM | POA: Diagnosis not present

## 2020-03-07 DIAGNOSIS — M6249 Contracture of muscle, multiple sites: Secondary | ICD-10-CM | POA: Diagnosis not present

## 2020-03-07 DIAGNOSIS — R293 Abnormal posture: Secondary | ICD-10-CM | POA: Diagnosis not present

## 2020-03-07 DIAGNOSIS — R2689 Other abnormalities of gait and mobility: Secondary | ICD-10-CM | POA: Diagnosis not present

## 2020-03-07 DIAGNOSIS — M25652 Stiffness of left hip, not elsewhere classified: Secondary | ICD-10-CM | POA: Diagnosis not present

## 2020-03-07 DIAGNOSIS — R29898 Other symptoms and signs involving the musculoskeletal system: Secondary | ICD-10-CM | POA: Diagnosis not present

## 2020-03-07 DIAGNOSIS — M6281 Muscle weakness (generalized): Secondary | ICD-10-CM | POA: Diagnosis not present

## 2020-03-07 DIAGNOSIS — M25612 Stiffness of left shoulder, not elsewhere classified: Secondary | ICD-10-CM | POA: Diagnosis not present

## 2020-03-07 DIAGNOSIS — M25662 Stiffness of left knee, not elsewhere classified: Secondary | ICD-10-CM | POA: Diagnosis not present

## 2020-03-07 DIAGNOSIS — R2681 Unsteadiness on feet: Secondary | ICD-10-CM | POA: Diagnosis not present

## 2020-03-07 DIAGNOSIS — M25672 Stiffness of left ankle, not elsewhere classified: Secondary | ICD-10-CM | POA: Diagnosis not present

## 2020-03-07 DIAGNOSIS — Z9181 History of falling: Secondary | ICD-10-CM | POA: Diagnosis not present

## 2020-03-07 DIAGNOSIS — M25651 Stiffness of right hip, not elsewhere classified: Secondary | ICD-10-CM | POA: Diagnosis not present
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
## 2020-03-10 ENCOUNTER — Ambulatory Visit: Payer: Medicaid Other | Admitting: Physical Therapy

## 2020-03-10 ENCOUNTER — Encounter: Payer: Self-pay | Admitting: Physical Therapy

## 2020-03-10 ENCOUNTER — Other Ambulatory Visit: Payer: Self-pay

## 2020-03-10 DIAGNOSIS — M6249 Contracture of muscle, multiple sites: Secondary | ICD-10-CM | POA: Diagnosis not present

## 2020-03-10 DIAGNOSIS — R2681 Unsteadiness on feet: Secondary | ICD-10-CM

## 2020-03-10 DIAGNOSIS — M25612 Stiffness of left shoulder, not elsewhere classified: Secondary | ICD-10-CM | POA: Diagnosis not present

## 2020-03-10 DIAGNOSIS — M25652 Stiffness of left hip, not elsewhere classified: Secondary | ICD-10-CM | POA: Diagnosis not present

## 2020-03-10 DIAGNOSIS — M6281 Muscle weakness (generalized): Secondary | ICD-10-CM | POA: Diagnosis not present

## 2020-03-10 DIAGNOSIS — M25651 Stiffness of right hip, not elsewhere classified: Secondary | ICD-10-CM | POA: Diagnosis not present

## 2020-03-10 DIAGNOSIS — R293 Abnormal posture: Secondary | ICD-10-CM

## 2020-03-10 DIAGNOSIS — Z9181 History of falling: Secondary | ICD-10-CM | POA: Diagnosis not present

## 2020-03-10 DIAGNOSIS — R2689 Other abnormalities of gait and mobility: Secondary | ICD-10-CM | POA: Diagnosis not present

## 2020-03-10 DIAGNOSIS — R531 Weakness: Secondary | ICD-10-CM | POA: Diagnosis not present

## 2020-03-10 DIAGNOSIS — M25662 Stiffness of left knee, not elsewhere classified: Secondary | ICD-10-CM | POA: Diagnosis not present

## 2020-03-10 DIAGNOSIS — M25672 Stiffness of left ankle, not elsewhere classified: Secondary | ICD-10-CM | POA: Diagnosis not present

## 2020-03-10 DIAGNOSIS — R29898 Other symptoms and signs involving the musculoskeletal system: Secondary | ICD-10-CM | POA: Diagnosis not present
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
## 2020-03-15 ENCOUNTER — Ambulatory Visit: Payer: Medicaid Other | Admitting: Physical Therapy

## 2020-03-15 ENCOUNTER — Encounter: Payer: Self-pay | Admitting: Physical Therapy

## 2020-03-15 ENCOUNTER — Other Ambulatory Visit: Payer: Self-pay

## 2020-03-15 DIAGNOSIS — M6249 Contracture of muscle, multiple sites: Secondary | ICD-10-CM | POA: Diagnosis not present

## 2020-03-15 DIAGNOSIS — R2681 Unsteadiness on feet: Secondary | ICD-10-CM

## 2020-03-15 DIAGNOSIS — M25672 Stiffness of left ankle, not elsewhere classified: Secondary | ICD-10-CM

## 2020-03-15 DIAGNOSIS — M25612 Stiffness of left shoulder, not elsewhere classified: Secondary | ICD-10-CM | POA: Diagnosis not present

## 2020-03-15 DIAGNOSIS — M25651 Stiffness of right hip, not elsewhere classified: Secondary | ICD-10-CM | POA: Diagnosis not present

## 2020-03-15 DIAGNOSIS — M25652 Stiffness of left hip, not elsewhere classified: Secondary | ICD-10-CM | POA: Diagnosis not present

## 2020-03-15 DIAGNOSIS — R29898 Other symptoms and signs involving the musculoskeletal system: Secondary | ICD-10-CM

## 2020-03-15 DIAGNOSIS — Z9181 History of falling: Secondary | ICD-10-CM | POA: Diagnosis not present

## 2020-03-15 DIAGNOSIS — M6281 Muscle weakness (generalized): Secondary | ICD-10-CM

## 2020-03-15 DIAGNOSIS — M25662 Stiffness of left knee, not elsewhere classified: Secondary | ICD-10-CM | POA: Diagnosis not present

## 2020-03-15 DIAGNOSIS — R531 Weakness: Secondary | ICD-10-CM | POA: Diagnosis not present

## 2020-03-15 DIAGNOSIS — R2689 Other abnormalities of gait and mobility: Secondary | ICD-10-CM

## 2020-03-15 DIAGNOSIS — R293 Abnormal posture: Secondary | ICD-10-CM

## 2020-03-15 MED FILL — GABAPENTIN 300 MG CAPSULE: 300 | 30 days supply | Qty: 120 | Fill #3

## 2020-03-15 MED FILL — ROSUVASTATIN CALCIUM 10 MG: 10 | 30 days supply | Qty: 30 | Fill #1
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
## 2020-03-22 ENCOUNTER — Ambulatory Visit: Payer: Medicaid Other | Admitting: Physical Therapy

## 2020-03-28 ENCOUNTER — Other Ambulatory Visit: Payer: Self-pay

## 2020-03-28 ENCOUNTER — Encounter: Payer: Self-pay | Admitting: Cardiology

## 2020-03-28 ENCOUNTER — Ambulatory Visit (INDEPENDENT_AMBULATORY_CARE_PROVIDER_SITE_OTHER): Payer: Medicaid Other | Admitting: Cardiology

## 2020-03-28 VITALS — BP 144/70 | HR 83 | Ht 72.0 in | Wt 235.0 lb

## 2020-03-28 DIAGNOSIS — E7801 Familial hypercholesterolemia: Secondary | ICD-10-CM | POA: Diagnosis not present

## 2020-03-28 DIAGNOSIS — E7841 Elevated Lipoprotein(a): Secondary | ICD-10-CM

## 2020-03-28 DIAGNOSIS — I10 Essential (primary) hypertension: Secondary | ICD-10-CM | POA: Diagnosis not present

## 2020-03-28 DIAGNOSIS — Z79899 Other long term (current) drug therapy: Secondary | ICD-10-CM | POA: Diagnosis not present

## 2020-03-28 NOTE — Addendum Note (Signed)
Addended by: Vernard Gambles on: 03/28/2020 04:11 PM   Modules accepted: Orders

## 2020-03-28 NOTE — Progress Notes (Signed)
Cardiology Office Note:    Date:  03/28/2020   ID:  Travis Benson, DOB 1961-07-30, MRN [id number]  PCP:  Travis Saupe, MD  Cardiologist:  Travis Herrlich, MD    Referring MD: Travis Saupe, MD    ASSESSMENT:    1. Familial hypercholesterolemia   2. Elevated Lp(a)   3. Essential hypertension    PLAN:    In order of problems listed above:  1. Screening shows that he has familial hyperlipidemia and elevated LP(a) puts him at high risk of premature CAD calcium score is indicated and if elevated may require an ischemia evaluation. 2. Recheck liver function lipid profile 3. Continue current antihypertensive calcium channel blocker Home blood pressures in range   Next appointment: 3 months   Medication Adjustments/Labs and Tests Ordered: Current medicines are reviewed at length with the patient today.  Concerns regarding medicines are outlined above.  No orders of the defined types were placed in this encounter.  No orders of the defined types were placed in this encounter.   Chief Complaint  Patient presents with   Follow-up   Hyperlipidemia    History of Present Illness:    Travis Benson is a 59 y.o. male with a hx of hypertension hypertriglyceridemia and evaluation of cardiovascular risk last seen 02/08/2020. Compliance with diet, lifestyle and medications: Yes  Further evaluation showed elevated lipoprotein a level and dyslipidemia with a total cholesterol 301 triglycerides elevated 194 and LDL cholesterol of 216 consistent with familial hyperlipidemia and started on a high intensity statin.  Cardiac CT calcium score was ordered but not yet performed.  He tolerates his statin without muscle pain or weakness and home blood pressure tends to run the range of 130/80.  Repeat blood pressure check by me in the office was closer to normal.  With his above-knee amputation is very physically difficult to get into the office and he is having pain today.  His calcium score was  not scheduled will perform it also check his lipid profile CMP see him back in the office in 3 months and if the score is significantly elevated he will need an ischemia evaluation. Past Medical History:  Diagnosis Date   Arthritis    neck   Complication of anesthesia    "crazy dreams"   Coronary artery disease    Head injury with loss of consciousness (HCC)    History of hepatitis C    Hypertension    Injury of nerve of right lower leg 03/02/2019   Left scapula fracture 03/02/2019   Open left tibial fracture 03/02/2019   Pelvic ring fracture (HCC), Right  03/02/2019   Right knee dislocation 03/02/2019   Unspecified injury of popliteal artery, right leg, initial encounter 03/02/2019    Past Surgical History:  Procedure Laterality Date   AMPUTATION Right 04/13/2019   Procedure: AMPUTATION ABOVE KNEE;  Surgeon: Myrene Galas, MD;  Location: [facility] OR;  Service: Orthopedics;  Laterality: Right;   APPLICATION OF WOUND VAC Bilateral 03/05/2019   Procedure: WOUND VAC CHANGE RIGHT LOWER LEG; REMOVAL OF WOUND VAC LEFT LOWER LEG WITH APPLICATION OF DRESSINGS;  Surgeon: Larina Earthly, MD;  Location: [facility] OR;  Service: Vascular;  Laterality: Bilateral;   APPLICATION OF WOUND VAC Right 04/09/2019   Procedure: APPLICATION OF WOUND VAC;  Surgeon: Myrene Galas, MD;  Location: [facility] OR;  Service: Orthopedics;  Laterality: Right;   APPLICATION OF WOUND VAC Bilateral 03/02/2019   Procedure: Application Of Wound Vac;  Surgeon: Myrene Galas, MD;  Location: [facility]  OR;  Service: Orthopedics;  Laterality: Bilateral;   APPLICATION OF WOUND VAC Right 03/02/2019   Procedure: Wound Vac dressing removal;  Surgeon: Maeola Harman, MD;  Location: [facility] OR;  Service: Vascular;  Laterality: Right;   EXTERNAL FIXATION LEG Right 03/02/2019   Procedure: External Fixation Leg;  Surgeon: Myrene Galas, MD;  Location: [facility] OR;  Service: Orthopedics;  Laterality: Right;   EXTERNAL FIXATION REMOVAL Left 03/02/2019   Procedure:  REMOVAL EXTERNAL FIXATION LEG;  Surgeon: Myrene Galas, MD;  Location: [facility] OR;  Service: Orthopedics;  Laterality: Left;   FASCIOTOMY Right 02/28/2019   Procedure: FOUR COMPARTMENT FASCIOTOMY OF RIGHT LOWER LEG;  Surgeon: Maeola Harman, MD;  Location: [facility] OR;  Service: Vascular;  Laterality: Right;   FEMORAL-POPLITEAL BYPASS GRAFT Right 02/28/2019   Procedure: BYPASS GRAFT OF ABOVE KNEE POPLITEAL- BELOW KNEE POPLITEAL ARTERY;  Surgeon: Maeola Harman, MD;  Location: [facility] OR;  Service: Vascular;  Laterality: Right;   FEMUR IM NAIL Left 03/02/2019   Procedure: INTRAMEDULLARY (IM) RETROGRADE FEMORAL NAILING;  Surgeon: Myrene Galas, MD;  Location: [facility] OR;  Service: Orthopedics;  Laterality: Left;   HARDWARE REMOVAL Left 11/19/2019   Procedure: HARDWARE REMOVAL LEFT TIBIA;  Surgeon: Myrene Galas, MD;  Location: [facility] OR;  Service: Orthopedics;  Laterality: Left;   I & D EXTREMITY Left 02/28/2019   Procedure: IRRIGATION AND DEBRIDEMENT OF LEFT LOWER LEG /INTERNAL FIXATION OF LEFT TIBIA PLACEMENT OF FRACTURE PINLEFT FEMUR/ CLOSED REDUCTION OF LEFT FEMUR.;  Surgeon: Durene Romans, MD;  Location: [facility] OR;  Service: Orthopedics;  Laterality: Left;   I & D EXTREMITY Right 04/09/2019   Procedure: IRRIGATION AND DEBRIDEMENT EXTREMITY R lower leg;  Surgeon: Myrene Galas, MD;  Location: [facility] OR;  Service: Orthopedics;  Laterality: Right;   I & D EXTREMITY Right 04/13/2019   Procedure: IRRIGATION AND DEBRIDEMENT EXTREMITY Right Leg;  Surgeon: Myrene Galas, MD;  Location: [facility] OR;  Service: Orthopedics;  Laterality: Right;   I & D EXTREMITY Left 03/02/2019   Procedure: IRRIGATION AND DEBRIDEMENT LEG;  Surgeon: Myrene Galas, MD;  Location: [facility] OR;  Service: Orthopedics;  Laterality: Left;   ORIF PELVIC FRACTURE WITH PERCUTANEOUS SCREWS Right 03/02/2019   Procedure: Orif Pelvic Fracture With Percutaneous Screws;  Surgeon: Myrene Galas, MD;  Location: [facility] OR;  Service: Orthopedics;  Laterality: Right;     SKIN SPLIT GRAFT Right 03/26/2019   Procedure: SKIN GRAFT SPLIT THICKNESS;  Surgeon: Myrene Galas, MD;  Location: [facility] OR;  Service: Orthopedics;  Laterality: Right;   TIBIA IM NAIL INSERTION Left 03/02/2019   Procedure: INTRAMEDULLARY (IM) NAIL TIBIAL;  Surgeon: Myrene Galas, MD;  Location: [facility] OR;  Service: Orthopedics;  Laterality: Left;    Current Medications: No outpatient medications have been marked as taking for the 03/28/20 encounter (Office Visit) with Baldo Daub, MD.     Allergies:   Patient has no known allergies.   Social History   Socioeconomic History   Marital status: Married    Spouse name: Not on file   Number of children: Not on file   Years of education: Not on file   Highest education level: Not on file  Occupational History   Not on file  Tobacco Use   Smoking status: Never Smoker   Smokeless tobacco: Never Used  Substance and Sexual Activity   Alcohol use: Never   Drug use: Never   Sexual activity: Not Currently    Birth control/protection: None  Other Topics Concern   Not on file  Social History Narrative   ** Merged History Encounter **       Social Determinants of Health   Financial Resource Strain: Unknown   Difficulty of Paying Living Expenses: Patient refused  Food Insecurity: Unknown   Worried About Charity fundraiser in the Last Year: Patient refused   Arboriculturist in the Last Year: Patient refused  Transportation Needs: Unknown   Film/video editor (Medical): Patient refused   Lack of Transportation (Non-Medical): Patient refused  Physical Activity: Unknown   Days of Exercise per Week: Patient refused   Minutes of Exercise per Session: Patient refused  Stress: Unknown   Feeling of Stress : Patient refused  Social Connections: Unknown   Frequency of Communication with Friends and Family: Patient refused   Frequency of Social Gatherings with Friends and Family: Patient refused   Attends Religious  Services: Patient refused   Marine scientist or Organizations: Patient refused   Attends Archivist Meetings: Patient refused   Marital Status: Patient refused     Family History: The patient's family history includes Diabetes in his father; Kidney failure in his mother. ROS:   Please see the history of present illness.    All other systems reviewed and are negative.  EKGs/Labs/Other Studies Reviewed:    The following studies were reviewed today:   Recent Labs: 11/19/2019: ALT 15; BUN 13; Creatinine, Ser 1.23; Hemoglobin 14.4; Platelets 540; Potassium 4.4; Sodium 140  Recent Lipid Panel    Component Value Date/Time   CHOL 301 (H) 02/08/2020 1559   TRIG 194 (H) 02/08/2020 1559   HDL 47 02/08/2020 1559   CHOLHDL 6.4 (H) 02/08/2020 1559   LDLCALC 216 (H) 02/08/2020 1559    Physical Exam:    VS:  BP (!) 180/90 (BP Location: Left Arm, Patient Position: Sitting, Cuff Size: Large)    Pulse 83    Ht 6' (1.829 m)    Wt 235 lb (106.6 kg)    SpO2 98%    BMI 31.87 kg/m     Wt Readings from Last 3 Encounters:  03/28/20 235 lb (106.6 kg)  02/08/20 211 lb 1.3 oz (95.7 kg)  11/19/19 177 lb (80.3 kg)     GEN:  Well nourished, well developed in no acute distress HEENT: Normal NECK: No JVD; No carotid bruits LYMPHATICS: No lymphadenopathy CARDIAC: RRR, no murmurs, rubs, gallops RESPIRATORY:  Clear to auscultation without rales, wheezing or rhonchi  ABDOMEN: Soft, non-tender, non-distended MUSCULOSKELETAL:  No edema; No deformity  SKIN: Warm and dry NEUROLOGIC:  Alert and oriented x 3 PSYCHIATRIC:  Normal affect    Signed, Shirlee More, MD  03/28/2020 3:54 PM    [facility]

## 2020-03-28 NOTE — Patient Instructions (Signed)
Medication Instructions:  Your physician recommends that you continue on your current medications as directed. Please refer to the Current Medication list given to you today.  *If you need a refill on your cardiac medications before your next appointment, please call your pharmacy*   Lab Work: Cmp & Lipids   If you have labs (blood work) drawn today and your tests are completely normal, you will receive your results only by: [name] MyChart Message (if you have MyChart) OR . A paper copy in the mail If you have any lab test that is abnormal or we need to change your treatment, we will call you to review the results.   Testing/Procedures: Your physician has ordered for you to have a calcium score test    Follow-Up: At [facility], you and your health needs are our priority.  As part of our continuing mission to provide you with exceptional heart care, we have created designated Provider Care Teams.  These Care Teams include your primary Cardiologist (physician) and Advanced Practice Providers (APPs -  Physician Assistants and Nurse Practitioners) who all work together to provide you with the care you need, when you need it.  We recommend signing up for the patient portal called "MyChart".  Sign up information is provided on this After Visit Summary.  MyChart is used to connect with patients for Virtual Visits (Telemedicine).  Patients are able to view lab/test results, encounter notes, upcoming appointments, etc.  Non-urgent messages can be sent to your provider as well.   To learn more about what you can do with MyChart, go to [email or web address].    Your next appointment:   3 month(s)  The format for your next appointment:   In Person  Provider:   Norman Herrlich, MD   Other Instructions None

## 2020-03-29 ENCOUNTER — Other Ambulatory Visit: Payer: Self-pay | Admitting: Cardiology

## 2020-03-29 ENCOUNTER — Ambulatory Visit: Payer: Medicaid Other | Admitting: Physical Therapy

## 2020-03-29 ENCOUNTER — Encounter: Payer: Self-pay | Admitting: Physical Therapy

## 2020-03-29 DIAGNOSIS — M6281 Muscle weakness (generalized): Secondary | ICD-10-CM | POA: Diagnosis not present

## 2020-03-29 DIAGNOSIS — R29898 Other symptoms and signs involving the musculoskeletal system: Secondary | ICD-10-CM | POA: Diagnosis not present

## 2020-03-29 DIAGNOSIS — R2681 Unsteadiness on feet: Secondary | ICD-10-CM | POA: Diagnosis not present

## 2020-03-29 DIAGNOSIS — M25651 Stiffness of right hip, not elsewhere classified: Secondary | ICD-10-CM | POA: Diagnosis not present

## 2020-03-29 DIAGNOSIS — M25652 Stiffness of left hip, not elsewhere classified: Secondary | ICD-10-CM | POA: Diagnosis not present

## 2020-03-29 DIAGNOSIS — M6249 Contracture of muscle, multiple sites: Secondary | ICD-10-CM | POA: Diagnosis not present

## 2020-03-29 DIAGNOSIS — Z9181 History of falling: Secondary | ICD-10-CM | POA: Diagnosis not present

## 2020-03-29 DIAGNOSIS — R2689 Other abnormalities of gait and mobility: Secondary | ICD-10-CM

## 2020-03-29 DIAGNOSIS — M25612 Stiffness of left shoulder, not elsewhere classified: Secondary | ICD-10-CM | POA: Diagnosis not present

## 2020-03-29 DIAGNOSIS — M25662 Stiffness of left knee, not elsewhere classified: Secondary | ICD-10-CM | POA: Diagnosis not present

## 2020-03-29 DIAGNOSIS — R293 Abnormal posture: Secondary | ICD-10-CM | POA: Diagnosis not present

## 2020-03-29 DIAGNOSIS — R531 Weakness: Secondary | ICD-10-CM

## 2020-03-29 DIAGNOSIS — M25672 Stiffness of left ankle, not elsewhere classified: Secondary | ICD-10-CM | POA: Diagnosis not present

## 2020-03-29 LAB — COMPREHENSIVE METABOLIC PANEL
ALT: 12 IU/L (ref 0–44)
AST: 20 IU/L (ref 0–40)
Albumin/Globulin Ratio: 1.2 (ref 1.2–2.2)
Albumin: 4.5 g/dL (ref 3.8–4.9)
Alkaline Phosphatase: 149 IU/L — ABNORMAL HIGH (ref 39–117)
BUN/Creatinine Ratio: 13 (ref 10–24)
BUN: 15 mg/dL (ref 8–27)
Bilirubin Total: 0.3 mg/dL (ref 0.0–1.2)
CO2: 17 mmol/L — ABNORMAL LOW (ref 20–29)
Calcium: 9.6 mg/dL (ref 8.6–10.2)
Chloride: 107 mmol/L — ABNORMAL HIGH (ref 96–106)
Creatinine, Ser: 1.19 mg/dL (ref 0.76–1.27)
GFR calc Af Amer: 76 mL/min/{1.73_m2} (ref >59)
GFR calc non Af Amer: 66 mL/min/{1.73_m2} (ref >59)
Globulin, Total: 3.8 g/dL (ref 1.5–4.5)
Glucose: 87 mg/dL (ref 65–99)
Potassium: 4.3 mmol/L (ref 3.5–5.2)
Sodium: 142 mmol/L (ref 134–144)
Total Protein: 8.3 g/dL (ref 6.0–8.5)

## 2020-03-29 LAB — LIPID PANEL
Chol/HDL Ratio: 4 ratio (ref 0.0–5.0)
Cholesterol, Total: 207 mg/dL — ABNORMAL HIGH (ref 100–199)
HDL: 52 mg/dL (ref >39)
LDL Chol Calc (NIH): 123 mg/dL — ABNORMAL HIGH (ref 0–99)
Triglycerides: 184 mg/dL — ABNORMAL HIGH (ref 0–149)
VLDL Cholesterol Cal: 32 mg/dL (ref 5–40)

## 2020-03-29 MED ORDER — EZETIMIBE 10 MG PO TABS: 10.0000 mg | ORAL_TABLET | Freq: Every day | ORAL | 12 refills | Status: DC

## 2020-03-29 MED FILL — ?EZETIMIBE 10 MG TABLET: 10 | 30 days supply | Qty: 30 | Fill #0

## 2020-03-29 NOTE — Addendum Note (Signed)
Addended by: Eleonore Chiquito on: 03/29/2020 09:29 AM   Modules accepted: Orders
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
## 2020-04-05 ENCOUNTER — Other Ambulatory Visit: Payer: Self-pay

## 2020-04-05 ENCOUNTER — Encounter: Payer: Self-pay | Admitting: Physical Therapy

## 2020-04-05 ENCOUNTER — Ambulatory Visit: Payer: Medicaid Other | Attending: Family Medicine | Admitting: Physical Therapy

## 2020-04-05 DIAGNOSIS — M25672 Stiffness of left ankle, not elsewhere classified: Secondary | ICD-10-CM | POA: Diagnosis not present

## 2020-04-05 DIAGNOSIS — M25651 Stiffness of right hip, not elsewhere classified: Secondary | ICD-10-CM | POA: Insufficient documentation

## 2020-04-05 DIAGNOSIS — R531 Weakness: Secondary | ICD-10-CM | POA: Diagnosis not present

## 2020-04-05 DIAGNOSIS — R29898 Other symptoms and signs involving the musculoskeletal system: Secondary | ICD-10-CM | POA: Diagnosis not present

## 2020-04-05 DIAGNOSIS — M25662 Stiffness of left knee, not elsewhere classified: Secondary | ICD-10-CM | POA: Insufficient documentation

## 2020-04-05 DIAGNOSIS — Z9181 History of falling: Secondary | ICD-10-CM | POA: Insufficient documentation

## 2020-04-05 DIAGNOSIS — M25612 Stiffness of left shoulder, not elsewhere classified: Secondary | ICD-10-CM | POA: Insufficient documentation

## 2020-04-05 DIAGNOSIS — R293 Abnormal posture: Secondary | ICD-10-CM | POA: Diagnosis not present

## 2020-04-05 DIAGNOSIS — R2689 Other abnormalities of gait and mobility: Secondary | ICD-10-CM | POA: Insufficient documentation

## 2020-04-05 DIAGNOSIS — M6249 Contracture of muscle, multiple sites: Secondary | ICD-10-CM | POA: Diagnosis not present

## 2020-04-05 DIAGNOSIS — R2681 Unsteadiness on feet: Secondary | ICD-10-CM | POA: Insufficient documentation

## 2020-04-05 DIAGNOSIS — M25652 Stiffness of left hip, not elsewhere classified: Secondary | ICD-10-CM | POA: Insufficient documentation
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
## 2020-04-12 ENCOUNTER — Ambulatory Visit: Payer: Medicaid Other | Admitting: Physical Therapy

## 2020-04-19 ENCOUNTER — Ambulatory Visit: Payer: Medicaid Other | Admitting: Physical Therapy

## 2020-04-19 ENCOUNTER — Encounter: Payer: Self-pay | Admitting: Physical Therapy

## 2020-04-19 ENCOUNTER — Other Ambulatory Visit: Payer: Self-pay

## 2020-04-19 DIAGNOSIS — M25662 Stiffness of left knee, not elsewhere classified: Secondary | ICD-10-CM

## 2020-04-19 DIAGNOSIS — R293 Abnormal posture: Secondary | ICD-10-CM | POA: Diagnosis not present

## 2020-04-19 DIAGNOSIS — Z9181 History of falling: Secondary | ICD-10-CM

## 2020-04-19 DIAGNOSIS — M25651 Stiffness of right hip, not elsewhere classified: Secondary | ICD-10-CM

## 2020-04-19 DIAGNOSIS — M25672 Stiffness of left ankle, not elsewhere classified: Secondary | ICD-10-CM | POA: Diagnosis not present

## 2020-04-19 DIAGNOSIS — M25612 Stiffness of left shoulder, not elsewhere classified: Secondary | ICD-10-CM | POA: Diagnosis not present

## 2020-04-19 DIAGNOSIS — R2689 Other abnormalities of gait and mobility: Secondary | ICD-10-CM | POA: Diagnosis not present

## 2020-04-19 DIAGNOSIS — M25652 Stiffness of left hip, not elsewhere classified: Secondary | ICD-10-CM

## 2020-04-19 DIAGNOSIS — M6249 Contracture of muscle, multiple sites: Secondary | ICD-10-CM | POA: Diagnosis not present

## 2020-04-19 DIAGNOSIS — R2681 Unsteadiness on feet: Secondary | ICD-10-CM

## 2020-04-19 DIAGNOSIS — R29898 Other symptoms and signs involving the musculoskeletal system: Secondary | ICD-10-CM

## 2020-04-19 DIAGNOSIS — R531 Weakness: Secondary | ICD-10-CM | POA: Diagnosis not present
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
## 2020-04-25 MED FILL — ?EZETIMIBE 10 MG TABLET: 10 | 30 days supply | Qty: 30 | Fill #1

## 2020-04-25 MED FILL — ROSUVASTATIN CALCIUM 10 MG: 10 | 30 days supply | Qty: 30 | Fill #2

## 2020-04-26 ENCOUNTER — Ambulatory Visit: Payer: Medicaid Other | Admitting: Physical Therapy

## 2020-04-26 ENCOUNTER — Other Ambulatory Visit: Payer: Self-pay

## 2020-04-26 ENCOUNTER — Encounter: Payer: Self-pay | Admitting: Physical Therapy

## 2020-04-26 DIAGNOSIS — M6249 Contracture of muscle, multiple sites: Secondary | ICD-10-CM

## 2020-04-26 DIAGNOSIS — M25672 Stiffness of left ankle, not elsewhere classified: Secondary | ICD-10-CM | POA: Diagnosis not present

## 2020-04-26 DIAGNOSIS — R293 Abnormal posture: Secondary | ICD-10-CM | POA: Diagnosis not present

## 2020-04-26 DIAGNOSIS — M25652 Stiffness of left hip, not elsewhere classified: Secondary | ICD-10-CM

## 2020-04-26 DIAGNOSIS — M25612 Stiffness of left shoulder, not elsewhere classified: Secondary | ICD-10-CM | POA: Diagnosis not present

## 2020-04-26 DIAGNOSIS — R531 Weakness: Secondary | ICD-10-CM

## 2020-04-26 DIAGNOSIS — R2689 Other abnormalities of gait and mobility: Secondary | ICD-10-CM

## 2020-04-26 DIAGNOSIS — R29898 Other symptoms and signs involving the musculoskeletal system: Secondary | ICD-10-CM

## 2020-04-26 DIAGNOSIS — Z9181 History of falling: Secondary | ICD-10-CM | POA: Diagnosis not present

## 2020-04-26 DIAGNOSIS — M25662 Stiffness of left knee, not elsewhere classified: Secondary | ICD-10-CM | POA: Diagnosis not present

## 2020-04-26 DIAGNOSIS — R2681 Unsteadiness on feet: Secondary | ICD-10-CM

## 2020-04-26 DIAGNOSIS — M25651 Stiffness of right hip, not elsewhere classified: Secondary | ICD-10-CM
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
## 2020-05-03 ENCOUNTER — Encounter: Payer: Self-pay | Admitting: Physical Therapy

## 2020-05-03 ENCOUNTER — Ambulatory Visit: Payer: Medicaid Other | Attending: Family Medicine | Admitting: Physical Therapy

## 2020-05-03 ENCOUNTER — Other Ambulatory Visit: Payer: Self-pay

## 2020-05-03 DIAGNOSIS — R2681 Unsteadiness on feet: Secondary | ICD-10-CM | POA: Diagnosis not present

## 2020-05-03 DIAGNOSIS — R29898 Other symptoms and signs involving the musculoskeletal system: Secondary | ICD-10-CM | POA: Insufficient documentation

## 2020-05-03 DIAGNOSIS — R2689 Other abnormalities of gait and mobility: Secondary | ICD-10-CM | POA: Diagnosis not present

## 2020-05-03 DIAGNOSIS — R293 Abnormal posture: Secondary | ICD-10-CM | POA: Insufficient documentation

## 2020-05-03 DIAGNOSIS — R531 Weakness: Secondary | ICD-10-CM | POA: Insufficient documentation

## 2020-05-03 DIAGNOSIS — M6249 Contracture of muscle, multiple sites: Secondary | ICD-10-CM | POA: Insufficient documentation

## 2020-05-03 DIAGNOSIS — Z9181 History of falling: Secondary | ICD-10-CM | POA: Diagnosis not present
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
## 2020-05-10 ENCOUNTER — Ambulatory Visit: Payer: Medicaid Other | Admitting: Physical Therapy

## 2020-05-17 ENCOUNTER — Other Ambulatory Visit: Payer: Self-pay

## 2020-05-17 ENCOUNTER — Ambulatory Visit: Payer: Medicaid Other | Admitting: Physical Therapy

## 2020-05-17 ENCOUNTER — Encounter: Payer: Self-pay | Admitting: Physical Therapy

## 2020-05-17 DIAGNOSIS — R2681 Unsteadiness on feet: Secondary | ICD-10-CM

## 2020-05-17 DIAGNOSIS — M6249 Contracture of muscle, multiple sites: Secondary | ICD-10-CM | POA: Diagnosis not present

## 2020-05-17 DIAGNOSIS — R2689 Other abnormalities of gait and mobility: Secondary | ICD-10-CM

## 2020-05-17 DIAGNOSIS — R29898 Other symptoms and signs involving the musculoskeletal system: Secondary | ICD-10-CM | POA: Diagnosis not present

## 2020-05-17 DIAGNOSIS — R293 Abnormal posture: Secondary | ICD-10-CM

## 2020-05-17 DIAGNOSIS — R531 Weakness: Secondary | ICD-10-CM | POA: Diagnosis not present

## 2020-05-17 DIAGNOSIS — Z9181 History of falling: Secondary | ICD-10-CM
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
## 2020-05-17 NOTE — Patient Instructions (Signed)
Access Code: [email or web address] URL: [email or web address] Date: 05/17/2020 Prepared by: [facility]  Exercises [facility] pose with feet apart - 1 x daily - 5 x weekly - 10 reps - 1 sets - 5 seconds hold Standing with Head movements 4 directions - 1 x daily - 5 x weekly - 1 sets - 10 reps - 2 seconds hold Standing Quarter Turn with Counter Support - 1 x daily - 5 x weekly - 10 reps - 1 sets - 5 seconds hold Side Stepping with Counter Support - 1 x daily - 5 x weekly - 10 reps - 1 sets - 5 seconds hold

## 2020-06-22 ENCOUNTER — Ambulatory Visit: Admission: RE | Admit: 2020-06-22 | Payer: Self-pay | Source: Ambulatory Visit

## 2020-06-22 ENCOUNTER — Other Ambulatory Visit: Payer: Self-pay

## 2020-07-10 ENCOUNTER — Ambulatory Visit: Payer: Medicaid Other | Admitting: Cardiology

## 2020-07-11 ENCOUNTER — Other Ambulatory Visit: Payer: Self-pay

## 2020-07-11 ENCOUNTER — Ambulatory Visit (INDEPENDENT_AMBULATORY_CARE_PROVIDER_SITE_OTHER)
Admission: RE | Admit: 2020-07-11 | Discharge: 2020-07-11 | Disposition: A | Discharge status: Home / Self Care | Payer: Self-pay | Source: Ambulatory Visit | Attending: Cardiology | Admitting: Cardiology

## 2020-07-11 DIAGNOSIS — E7841 Elevated Lipoprotein(a): Secondary | ICD-10-CM

## 2020-07-11 DIAGNOSIS — E7801 Familial hypercholesterolemia: Secondary | ICD-10-CM

## 2020-07-12 ENCOUNTER — Telehealth: Payer: Self-pay

## 2020-07-12 MED FILL — EZETIMIBE 10 MG TAB: 10 | 30 days supply | Qty: 30 | Fill #3

## 2020-07-12 MED FILL — ROSUVASTATIN CALCIUM 10 MG: 10 | 30 days supply | Qty: 30 | Fill #4

## 2020-07-12 NOTE — Telephone Encounter (Signed)
Spoke with patient regarding results and recommendation.  Patient verbalizes understanding and is agreeable to plan of care. Advised patient to call back with any issues or concerns.  

## 2020-07-12 NOTE — Telephone Encounter (Signed)
-----   Message from Baldo Daub, MD sent at 07/12/2020  9:01 AM EDT ----- Normal or stable result  Despite the severity of his hyperlipidemia his coronary calcium score is relatively low less than 10 he can discuss further at office follow-up

## 2020-07-19 DIAGNOSIS — S82302K Unspecified fracture of lower end of left tibia, subsequent encounter for closed fracture with nonunion: Secondary | ICD-10-CM | POA: Diagnosis not present

## 2020-07-19 DIAGNOSIS — M79652 Pain in left thigh: Secondary | ICD-10-CM | POA: Diagnosis not present

## 2020-07-19 DIAGNOSIS — T8484XD Pain due to internal orthopedic prosthetic devices, implants and grafts, subsequent encounter: Secondary | ICD-10-CM | POA: Diagnosis not present

## 2020-07-28 ENCOUNTER — Other Ambulatory Visit: Payer: Self-pay | Admitting: Orthopedic Surgery

## 2020-07-28 DIAGNOSIS — S82302K Unspecified fracture of lower end of left tibia, subsequent encounter for closed fracture with nonunion: Secondary | ICD-10-CM

## 2020-08-01 ENCOUNTER — Ambulatory Visit
Admission: RE | Admit: 2020-08-01 | Discharge: 2020-08-01 | Disposition: A | Discharge status: Home / Self Care | Payer: Medicaid Other | Source: Ambulatory Visit | Attending: Orthopedic Surgery | Admitting: Orthopedic Surgery

## 2020-08-01 ENCOUNTER — Other Ambulatory Visit: Payer: Self-pay

## 2020-08-01 DIAGNOSIS — M25452 Effusion, left hip: Secondary | ICD-10-CM | POA: Diagnosis not present

## 2020-08-01 DIAGNOSIS — M25462 Effusion, left knee: Secondary | ICD-10-CM | POA: Diagnosis not present

## 2020-08-01 DIAGNOSIS — S72352A Displaced comminuted fracture of shaft of left femur, initial encounter for closed fracture: Secondary | ICD-10-CM | POA: Diagnosis not present

## 2020-08-01 DIAGNOSIS — S82302K Unspecified fracture of lower end of left tibia, subsequent encounter for closed fracture with nonunion: Secondary | ICD-10-CM

## 2020-08-02 ENCOUNTER — Other Ambulatory Visit: Payer: Self-pay | Admitting: Physician Assistant

## 2020-08-02 ENCOUNTER — Ambulatory Visit: Payer: Medicaid Other | Attending: Physician Assistant | Admitting: Physician Assistant

## 2020-08-02 DIAGNOSIS — I10 Essential (primary) hypertension: Secondary | ICD-10-CM

## 2020-08-02 MED ORDER — LISINOPRIL 10 MG PO TABS: 10.0000 mg | ORAL_TABLET | Freq: Every day | ORAL | 3 refills | Status: DC

## 2020-08-02 MED FILL — LISINOPRIL 10 MG TABS: 10 | 30 days supply | Qty: 30 | Fill #0

## 2020-08-02 NOTE — Progress Notes (Signed)
Virtual Visit via Telephone Note  I connected with Travis Benson on 08/02/20 at  3:50 PM EDT by telephone and verified that I am speaking with the correct person using two identifiers.   I discussed the limitations, risks, security and privacy concerns of performing an evaluation and management service by telephone and the availability of in person appointments. I also discussed with the patient that there may be a patient responsible charge related to this service. The patient expressed understanding and agreed to proceed.  PATIENT visit by telephone virtually in the context of Covid-19 pandemic. Patient location:  home My Location:  [facility] office Persons on the call:  Me and the patient   History of Present Illness: L ankle and foot having swelling and wants to change from amlodipine.  On amlodipine for about 1 year.  Swelling for about 3 months.  If he doesn't take it for a few days the swelling goes down.  R amputation.  Not diabetic.   Observations/Objective:  NAD.     Assessment and Plan:   1. Essential hypertension Stop amlodipine - lisinopril (ZESTRIL) 10 MG tablet; Take 1 tablet (10 mg total) by mouth daily.  Dispense: 90 tablet; Refill: 3 -BUN/cr reviewed and wnl 02/2020 Check bP daily and record and have available for next appt.      Follow Up Instructions: Check BMP and see PCP next OV   I discussed the assessment and treatment plan with the patient. The patient was provided an opportunity to ask questions and all were answered. The patient agreed with the plan and demonstrated an understanding of the instructions.   The patient was advised to call back or seek an in-person evaluation if the symptoms worsen or if the condition fails to improve as anticipated.  I provided 12 minutes of non-face-to-face time during this encounter.   Georgian Co, PA-C  Patient ID: Travis Benson, male   DOB: 1961/01/14, 59 y.o.   MRN: [id number]

## 2020-08-10 NOTE — Progress Notes (Signed)
Cardiology Office Note:    Date:  08/11/2020   ID:  Travis Benson, DOB [birth year], MRN [id number]  PCP:  Cain Saupe, MD  Cardiologist:  Norman Herrlich, MD    Referring MD: Cain Saupe, MD    ASSESSMENT:    1. Familial hypercholesterolemia   2. Elevated Lp(a)   3. Essential hypertension    PLAN:    In order of problems listed above:  1. Continue combined therapy high intensity statin Zetia check lipid profile LP(a) and CMP. 2. Continue statin 3. With increased cardiovascular risk and family history cardiac CTA is appropriate and ordered   Next appointment: 6 months   Medication Adjustments/Labs and Tests Ordered: Current medicines are reviewed at length with the patient today.  Concerns regarding medicines are outlined above.  Orders Placed This Encounter  Procedures  . CT CORONARY MORPH W/CTA COR W/SCORE W/CA W/CM &/OR WO/CM  . CT CORONARY FRACTIONAL FLOW RESERVE DATA PREP  . CT CORONARY FRACTIONAL FLOW RESERVE FLUID ANALYSIS  . Comprehensive metabolic panel  . Lipid panel  . Pro b natriuretic peptide (BNP)  . Basic metabolic panel   Meds ordered this encounter  Medications  . metoprolol tartrate (LOPRESSOR) 100 MG tablet    Sig: Take 1 tablet (100 mg total) by mouth once for 1 dose. Take one tablet by mouth two hours prior to your cardiac CT    Dispense:  1 tablet    Refill:  0    No chief complaint on file.   History of Present Illness:    Travis Benson is a 59 y.o. male with a hx of familial hyperlipidemia and elevated LPa last seen 03/28/2020.  He is on lipid-lowering therapy with a combination of high intensity statin and Zetia.  Compliance with diet, lifestyle and medications: Yes  He has good healthcare literacy we reviewed the results of his CT calcium score which is moderate however he is at increased cardiovascular risk with his family history father died of CAD awaiting bypass surgery age 45 and his underlying severe lipid disorder.  Although  he is not having angina after review of risk and benefits he will undergo cardiac CTA to define the presence or absence of obstructive CAD.  If flow-limiting stenosis would benefit from revascularization.  Tolerating lipid-lowering therapy without muscle pain or weakness will check lipids today including CMP profile LP(a) level.  CT calcium score: 07/11/2020 IMPRESSION: Coronary calcium score of 6.44. This was 59th percentile for age and sex matched control. Past Medical History:  Diagnosis Date  . Arthritis    neck  . Complication of anesthesia    "crazy dreams"  . Coronary artery disease   . Head injury with loss of consciousness (HCC)   . History of hepatitis C   . Hypertension   . 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 Left scapula fracture 03/02/2019  . Open left tibial fracture 03/02/2019  . Pelvic ring fracture (HCC), Right  03/02/2019  . Right knee dislocation 03/02/2019  . Unspecified injury of popliteal artery, right leg, initial encounter 03/02/2019      Past Surgical History:  Procedure Laterality Date  . AMPUTATION Right 04/13/2019   Procedure: AMPUTATION ABOVE KNEE;  Surgeon: Myrene Galas, MD;  Location: [facility] OR;  Service: Orthopedics;  Laterality: Right;  . APPLICATION OF WOUND VAC Bilateral 03/05/2019   Procedure: WOUND VAC CHANGE RIGHT LOWER LEG; REMOVAL OF WOUND VAC LEFT LOWER LEG WITH APPLICATION OF DRESSINGS;  Surgeon: Larina Earthly, MD;  [facility]: [facility] OR;  Service: Vascular;  Laterality: Bilateral;  . APPLICATION OF WOUND VAC Right 04/09/2019   Procedure: APPLICATION OF WOUND VAC;  Surgeon: Myrene GalasHandy, Michael, MD;  Location: [facility] OR;  Service: Orthopedics;  Laterality: Right;  . APPLICATION OF WOUND VAC Bilateral 03/02/2019   Procedure: Application Of Wound Vac;  Surgeon: Myrene GalasHandy, Michael, MD;  [facility];  Service: Orthopedics;  Laterality: Bilateral;  . APPLICATION OF WOUND VAC Right 03/02/2019   Procedure: Wound Vac dressing removal;  Surgeon: Maeola Harmanain, Brandon Christopher,  MD;  [facility];  Service: Vascular;  Laterality: Right;  . EXTERNAL FIXATION LEG Right 03/02/2019   Procedure: External Fixation Leg;  Surgeon: Myrene GalasHandy, Michael, MD;  [facility];  Service: Orthopedics;  Laterality: Right;  . EXTERNAL FIXATION REMOVAL Left 03/02/2019   Procedure: REMOVAL EXTERNAL FIXATION LEG;  Surgeon: Myrene GalasHandy, Michael, MD;  [facility];  Service: Orthopedics;  Laterality: Left;  . FASCIOTOMY Right 02/28/2019   Procedure: FOUR COMPARTMENT FASCIOTOMY OF RIGHT LOWER LEG;  Surgeon: Maeola Harmanain, Brandon Christopher, MD;  [facility];  Service: Vascular;  Laterality: Right;  . FEMORAL-POPLITEAL BYPASS GRAFT Right 02/28/2019   Procedure: BYPASS GRAFT OF ABOVE KNEE POPLITEAL- BELOW KNEE POPLITEAL ARTERY;  Surgeon: Maeola Harmanain, Brandon Christopher, MD;  [facility];  Service: Vascular;  Laterality: Right;  . FEMUR IM NAIL Left 03/02/2019   Procedure: INTRAMEDULLARY (IM) RETROGRADE FEMORAL NAILING;  Surgeon: Myrene GalasHandy, Michael, MD;  [facility] OR;  Service: Orthopedics;  Laterality: Left;  . HARDWARE REMOVAL Left 11/19/2019   Procedure: HARDWARE REMOVAL LEFT TIBIA;  Surgeon: Myrene GalasHandy, Michael, MD;  [facility];  Service: Orthopedics;  Laterality: Left;  . I & D EXTREMITY Left 02/28/2019   Procedure: IRRIGATION AND DEBRIDEMENT OF LEFT LOWER LEG /INTERNAL FIXATION OF LEFT TIBIA PLACEMENT OF FRACTURE PINLEFT FEMUR/ CLOSED REDUCTION OF LEFT FEMUR.;  Surgeon: Durene Romanslin, Matthew, MD;  [facility] OR;  Service: Orthopedics;  Laterality: Left;  . I & D EXTREMITY Right 04/09/2019   Procedure: IRRIGATION AND DEBRIDEMENT EXTREMITY R lower leg;  Surgeon: Myrene GalasHandy, Michael, MD;  [facility];  Service: Orthopedics;  Laterality: Right;  . I & D EXTREMITY Right 04/13/2019   Procedure: IRRIGATION AND DEBRIDEMENT EXTREMITY Right Leg;  Surgeon: Myrene GalasHandy, Michael, MD;  [facility];  Service: Orthopedics;  Laterality: Right;  . I & D EXTREMITY Left 03/02/2019   Procedure: IRRIGATION AND DEBRIDEMENT LEG;  Surgeon: Myrene GalasHandy,  Michael, MD;  [facility];  Service: Orthopedics;  Laterality: Left;  . ORIF PELVIC FRACTURE WITH PERCUTANEOUS SCREWS Right 03/02/2019   Procedure: Orif Pelvic Fracture With Percutaneous Screws;  Surgeon: Myrene GalasHandy, Michael, MD;  [facility];  Service: Orthopedics;  Laterality: Right;  . SKIN SPLIT GRAFT Right 03/26/2019   Procedure: SKIN GRAFT SPLIT THICKNESS;  Surgeon: Myrene GalasHandy, Michael, MD;  [facility];  Service: Orthopedics;  Laterality: Right;  . TIBIA IM NAIL INSERTION Left 03/02/2019   Procedure: INTRAMEDULLARY (IM) NAIL TIBIAL;  Surgeon: Myrene GalasHandy, Michael, MD;  [facility] OR;  Service: Orthopedics;  Laterality: Left;    Current Medications: Current Meds  Medication Sig  . albuterol (PROVENTIL) (2.5 MG/3ML) 0.083% nebulizer solution Take 3 mLs (2.5 mg total) by nebulization every 6 (six) hours as needed for wheezing or shortness of breath.  [name]. aspirin EC 81 MG EC tablet Take 1 tablet (81 mg total) by mouth daily.  [name]. gabapentin (NEURONTIN) 300 MG capsule Take 300 mg by mouth at bedtime. Patient takes two tablets by mouth at bedtime  .  ibuprofen (ADVIL) 200 MG tablet Take 400 mg by mouth every 6 (six) hours as needed for moderate pain.  [name] lisinopril (ZESTRIL) 10 MG tablet Take 1 tablet (10 mg total) by mouth daily.  [name] nystatin cream (MYCOSTATIN) Apply 1 application topically 2 (two) times daily. X 10 days as needed for itchy rash then as needed  . triamcinolone cream (KENALOG) 0.1 % Apply 1 application topically 2 (two) times daily. X 7 days to areas of skin irritation then as needed     Allergies:   Patient has no known allergies.   Social History   Socioeconomic History  . Marital status: Married    Spouse name: Not on file  . Number of children: Not on file  . Years of education: Not on file  . Highest education level: Not on file  Occupational History  . Not on file  Tobacco Use  . Smoking status: Never Smoker  . Smokeless tobacco: Never Used  Vaping Use  . Vaping Use:  Never used  Substance and Sexual Activity  . Alcohol use: Never  . Drug use: Never  . Sexual activity: Not Currently    Birth control/protection: None  Other Topics Concern  . Not on file  Social History Narrative   ** Merged History Encounter **       Social Determinants of Health   Financial Resource Strain:   . Difficulty of Paying Living Expenses:   Food Insecurity:   . Worried About Programme researcher, broadcasting/film/video in the Last Year:   . Barista in the Last Year:   Transportation Needs:   . Freight forwarder (Medical):   [name] Lack of Transportation (Non-Medical):   Physical Activity:   . Days of Exercise per Week:   . Minutes of Exercise per Session:   Stress:   . Feeling of Stress :   Social Connections:   . Frequency of Communication with Friends and Family:   . Frequency of Social Gatherings with Friends and Family:   . Attends Religious Services:   . Active Member of Clubs or Organizations:   . Attends Banker Meetings:   [name] Marital Status:      Family History: The patient's family history includes Diabetes in his father; Kidney failure in his mother. ROS:   Please see the history of present illness.    All other systems reviewed and are negative.  EKGs/Labs/Other Studies Reviewed:    The following studies were reviewed today:   Recent Labs: 11/19/2019: Hemoglobin 14.4; Platelets 540 03/28/2020: ALT 12; BUN 15; Creatinine, Ser 1.19; Potassium 4.3; Sodium 142  Recent Lipid Panel    Component Value Date/Time   CHOL 207 (H) 03/28/2020 1609   TRIG 184 (H) 03/28/2020 1609   HDL 52 03/28/2020 1609   CHOLHDL 4.0 03/28/2020 1609   LDLCALC 123 (H) 03/28/2020 1609    Physical Exam:    VS:  BP 134/72 (BP Location: Right Arm, Patient Position: Sitting, Cuff Size: Normal)   Pulse 90   Ht 6' (1.829 m)   Wt 248 lb 12.8 oz (112.9 kg)   SpO2 96%   BMI 33.74 kg/m     Wt Readings from Last 3 Encounters:  08/11/20 248 lb 12.8 oz (112.9 kg)  03/28/20  235 lb (106.6 kg)  02/08/20 211 lb 1.3 oz (95.7 kg)     GEN:  Well nourished, well developed in no acute distress HEENT: Normal NECK: No JVD; No carotid bruits LYMPHATICS: No lymphadenopathy  CARDIAC: RRR, no murmurs, rubs, gallops RESPIRATORY:  Clear to auscultation without rales, wheezing or rhonchi  ABDOMEN: Soft, non-tender, non-distended MUSCULOSKELETAL:  No edema; No deformity  SKIN: Warm and dry NEUROLOGIC:  Alert and oriented x 3 PSYCHIATRIC:  Normal affect    Signed, Norman Herrlich, MD  08/11/2020 3:05 PM    [facility]

## 2020-08-11 ENCOUNTER — Other Ambulatory Visit: Payer: Self-pay

## 2020-08-11 ENCOUNTER — Encounter: Payer: Self-pay | Admitting: Cardiology

## 2020-08-11 ENCOUNTER — Ambulatory Visit (INDEPENDENT_AMBULATORY_CARE_PROVIDER_SITE_OTHER): Payer: Medicaid Other | Admitting: Cardiology

## 2020-08-11 VITALS — BP 134/72 | HR 90 | Ht 72.0 in | Wt 248.8 lb

## 2020-08-11 DIAGNOSIS — I10 Essential (primary) hypertension: Secondary | ICD-10-CM | POA: Diagnosis not present

## 2020-08-11 DIAGNOSIS — E7841 Elevated Lipoprotein(a): Secondary | ICD-10-CM

## 2020-08-11 DIAGNOSIS — E7801 Familial hypercholesterolemia: Secondary | ICD-10-CM

## 2020-08-11 MED ORDER — METOPROLOL TARTRATE 100 MG PO TABS: 100.0000 mg | ORAL_TABLET | Freq: Once | ORAL | 0 refills | Status: DC

## 2020-08-11 MED FILL — ?METOPROLOL TART 100 MG TAB: 100 | 1 days supply | Qty: 1 | Fill #0

## 2020-08-11 NOTE — Patient Instructions (Signed)
Medication Instructions:  Your physician recommends that you continue on your current medications as directed. Please refer to the Current Medication list given to you today.  *If you need a refill on your cardiac medications before your next appointment, please call your pharmacy*   Lab Work: Your physician recommends that you return for lab work in: TODAY CMP, Lipids, ProBNP  Within one week of your cardiac CT: BMP If you have labs (blood work) drawn today and your tests are completely normal, you will receive your results only by: [name] MyChart Message (if you have MyChart) OR . A paper copy in the mail If you have any lab test that is abnormal or we need to change your treatment, we will call you to review the results.   Testing/Procedures: Your cardiac CT will be scheduled at the below location:   [facility] [address] [place], [place] [address] [phone]  If scheduled at [facility], please arrive at the [facility] main entrance of [facility] 30 minutes prior to test start time. Proceed to the [facility] Radiology Department (first floor) to check-in and test prep.   Please follow these instructions carefully (unless otherwise directed):   On the Night Before the Test: . Be sure to Drink plenty of water. . Do not consume any caffeinated/decaffeinated beverages or chocolate 12 hours prior to your test. . Do not take any antihistamines 12 hours prior to your test.  On the Day of the Test: . Drink plenty of water. Do not drink any water within one hour of the test. . Do not eat any food 4 hours prior to the test. . You may take your regular medications prior to the test.  . Take metoprolol (Lopressor) two hours prior to test.       After the Test: . Drink plenty of water. . After receiving IV contrast, you may experience a mild flushed feeling. This is normal. . On occasion, you may experience a mild rash up to 24 hours after the  test. This is not dangerous. If this occurs, you can take Benadryl 25 mg and increase your fluid intake. . If you experience trouble breathing, this can be serious. If it is severe call 911 IMMEDIATELY. If it is mild, please call our office. . If you take any of these medications: Glipizide/Metformin, Avandament, Glucavance, please do not take 48 hours after completing test unless otherwise instructed.   Once we have confirmed authorization from your insurance company, we will call you to set up a date and time for your test. Based on how quickly your insurance processes prior authorizations requests, please allow up to 4 weeks to be contacted for scheduling your Cardiac CT appointment. Be advised that routine Cardiac CT appointments could be scheduled as many as 8 weeks after your provider has ordered it.  For non-scheduling related questions, please contact the cardiac imaging nurse navigator should you have any questions/concerns: Marchia Bond, Cardiac Imaging Nurse Navigator Burley Saver, Interim Cardiac Imaging Nurse [facility] [place] [facility] Direct Office Dial: [phone]   For scheduling needs, including cancellations and rescheduling, please call Vivien Rota at [phone], option 3.      Follow-Up: At [facility], you and your health needs are our priority.  As part of our continuing mission to provide you with exceptional heart care, we have created designated Provider Care Teams.  These Care Teams include your primary Cardiologist (physician) and Advanced Practice Providers (APPs -  Physician Assistants and Nurse Practitioners) who all work together to provide you with the care you need, when you need it.  We recommend signing up for the patient portal called "MyChart".  Sign up information is provided on this After Visit Summary.  MyChart is used to connect with patients for Virtual Visits (Telemedicine).  Patients are able to view lab/test results, encounter  notes, upcoming appointments, etc.  Non-urgent messages can be sent to your provider as well.   To learn more about what you can do with MyChart, go to [email or web address].    Your next appointment:   6 month(s)  The format for your next appointment:   In Person  Provider:   Shirlee More, MD   Other Instructions

## 2020-08-12 LAB — PRO B NATRIURETIC PEPTIDE: NT-Pro BNP: 29 pg/mL (ref 0–210)

## 2020-08-12 LAB — COMPREHENSIVE METABOLIC PANEL
ALT: 18 IU/L (ref 0–44)
AST: 24 IU/L (ref 0–40)
Albumin/Globulin Ratio: 1.1 — ABNORMAL LOW (ref 1.2–2.2)
Albumin: 4.2 g/dL (ref 3.8–4.9)
Alkaline Phosphatase: 144 IU/L — ABNORMAL HIGH (ref 48–121)
BUN/Creatinine Ratio: 11 (ref 10–24)
BUN: 14 mg/dL (ref 8–27)
Bilirubin Total: 0.4 mg/dL (ref 0.0–1.2)
CO2: 22 mmol/L (ref 20–29)
Calcium: 9.5 mg/dL (ref 8.6–10.2)
Chloride: 105 mmol/L (ref 96–106)
Creatinine, Ser: 1.27 mg/dL (ref 0.76–1.27)
GFR calc Af Amer: 71 mL/min/{1.73_m2} (ref >59)
GFR calc non Af Amer: 61 mL/min/{1.73_m2} (ref >59)
Globulin, Total: 3.9 g/dL (ref 1.5–4.5)
Glucose: 92 mg/dL (ref 65–99)
Potassium: 4.5 mmol/L (ref 3.5–5.2)
Sodium: 141 mmol/L (ref 134–144)
Total Protein: 8.1 g/dL (ref 6.0–8.5)

## 2020-08-12 LAB — LIPID PANEL
Chol/HDL Ratio: 4 ratio (ref 0.0–5.0)
Cholesterol, Total: 190 mg/dL (ref 100–199)
HDL: 48 mg/dL (ref >39)
LDL Chol Calc (NIH): 111 mg/dL — ABNORMAL HIGH (ref 0–99)
Triglycerides: 175 mg/dL — ABNORMAL HIGH (ref 0–149)
VLDL Cholesterol Cal: 31 mg/dL (ref 5–40)

## 2020-08-14 ENCOUNTER — Telehealth: Payer: Self-pay

## 2020-08-14 MED FILL — ROSUVASTATIN CALCIUM 10 MG: 10 | 30 days supply | Qty: 30 | Fill #5

## 2020-08-14 MED FILL — EZETIMIBE 10 MG TABS: 10 | 30 days supply | Qty: 30 | Fill #4

## 2020-08-14 NOTE — Telephone Encounter (Signed)
-----   Message from Baldo Daub, MD sent at 08/12/2020 12:08 PM EDT ----- Normal or stable result  Better no changes

## 2020-08-14 NOTE — Telephone Encounter (Signed)
Spoke with patient regarding results and recommendation.  Patient verbalizes understanding and is agreeable to plan of care. Advised patient to call back with any issues or concerns.  

## 2020-08-17 MED FILL — LISINOPRIL 10 MG TABS: 10 | 30 days supply | Qty: 30 | Fill #0

## 2020-09-14 ENCOUNTER — Other Ambulatory Visit: Payer: Self-pay | Admitting: Cardiology

## 2020-09-14 MED FILL — LISINOPRIL 10 MG TABS: 10 | 30 days supply | Qty: 30 | Fill #1

## 2020-09-14 MED FILL — EZETIMIBE 10 MG TABS: 10 | 30 days supply | Qty: 30 | Fill #5

## 2020-09-15 MED FILL — ROSUVASTATIN CALCIUM 10 MG: 10 | 30 days supply | Qty: 30 | Fill #0

## 2020-09-25 ENCOUNTER — Other Ambulatory Visit: Payer: Self-pay

## 2020-09-25 DIAGNOSIS — E7841 Elevated Lipoprotein(a): Secondary | ICD-10-CM

## 2020-09-25 DIAGNOSIS — I10 Essential (primary) hypertension: Secondary | ICD-10-CM

## 2020-09-25 DIAGNOSIS — E7801 Familial hypercholesterolemia: Secondary | ICD-10-CM

## 2020-09-26 LAB — BASIC METABOLIC PANEL
BUN/Creatinine Ratio: 11 (ref 10–24)
BUN: 13 mg/dL (ref 8–27)
CO2: 21 mmol/L (ref 20–29)
Calcium: 9.5 mg/dL (ref 8.6–10.2)
Chloride: 106 mmol/L (ref 96–106)
Creatinine, Ser: 1.17 mg/dL (ref 0.76–1.27)
GFR calc Af Amer: 78 mL/min/{1.73_m2} (ref >59)
GFR calc non Af Amer: 67 mL/min/{1.73_m2} (ref >59)
Glucose: 89 mg/dL (ref 65–99)
Potassium: 4.4 mmol/L (ref 3.5–5.2)
Sodium: 141 mmol/L (ref 134–144)

## 2020-09-27 ENCOUNTER — Telehealth: Payer: Self-pay

## 2020-09-27 ENCOUNTER — Telehealth (HOSPITAL_COMMUNITY): Payer: Self-pay | Admitting: Emergency Medicine

## 2020-09-27 NOTE — Telephone Encounter (Signed)
Attempted to call patient regarding upcoming cardiac CT appointment. °Left message on voicemail with name and callback number °Royalti Schauf RN Navigator Cardiac Imaging °[facility] °[phone] Office °[phone] Cell ° °

## 2020-09-27 NOTE — Telephone Encounter (Signed)
Spoke with patient regarding results and recommendation.  Patient verbalizes understanding and is agreeable to plan of care. Advised patient to call back with any issues or concerns.  

## 2020-09-27 NOTE — Telephone Encounter (Signed)
-----   Message from Baldo Daub, MD sent at 09/26/2020  7:51 AM EDT ----- Normal

## 2020-09-28 ENCOUNTER — Other Ambulatory Visit: Payer: Self-pay

## 2020-09-28 ENCOUNTER — Ambulatory Visit (HOSPITAL_COMMUNITY)
Admission: RE | Admit: 2020-09-28 | Discharge: 2020-09-28 | Disposition: A | Discharge status: Home / Self Care | Payer: Medicaid Other | Source: Ambulatory Visit | Attending: Cardiology | Admitting: Cardiology

## 2020-09-28 DIAGNOSIS — E7801 Familial hypercholesterolemia: Secondary | ICD-10-CM

## 2020-09-28 DIAGNOSIS — E7841 Elevated Lipoprotein(a): Secondary | ICD-10-CM | POA: Insufficient documentation

## 2020-09-28 DIAGNOSIS — I10 Essential (primary) hypertension: Secondary | ICD-10-CM

## 2020-09-28 MED ORDER — NITROGLYCERIN 0.4 MG SL SUBL
SUBLINGUAL_TABLET | SUBLINGUAL | Status: AC
  Filled 2020-09-28: qty 2

## 2020-09-28 MED ORDER — IOHEXOL 350 MG/ML SOLN
80.0000 mL | Freq: Once | INTRAVENOUS | Status: AC | PRN
  Administered 2020-09-28: 80 mL via INTRAVENOUS

## 2020-09-28 MED ORDER — NITROGLYCERIN 0.4 MG SL SUBL
0.8000 mg | SUBLINGUAL_TABLET | Freq: Once | SUBLINGUAL | Status: AC
  Administered 2020-09-28: 0.8 mg via SUBLINGUAL

## 2020-09-29 ENCOUNTER — Telehealth: Payer: Self-pay

## 2020-09-29 NOTE — Telephone Encounter (Signed)
-----   Message from Baldo Daub, MD sent at 09/29/2020 12:10 PM EDT ----- This is a good result he is on the correct medications and he does not have severe blockage does not need things like a stent or bypass surgery.  I can review in further detail at the office follow-up

## 2020-09-29 NOTE — Telephone Encounter (Signed)
Left message on patients voicemail to please return our call.   

## 2020-10-02 ENCOUNTER — Telehealth: Payer: Self-pay

## 2020-10-02 NOTE — Telephone Encounter (Signed)
Left message on patients voicemail to please return our call.   

## 2020-10-02 NOTE — Telephone Encounter (Signed)
-----   Message from Brian J Munley, MD sent at 09/29/2020 12:10 PM EDT ----- This is a good result he is on the correct medications and he does not have severe blockage does not need things like a stent or bypass surgery.  I can review in further detail at the office follow-up 

## 2020-10-03 ENCOUNTER — Telehealth: Payer: Self-pay

## 2020-10-03 NOTE — Telephone Encounter (Signed)
-----   Message from Baldo Daub, MD sent at 09/29/2020 12:10 PM EDT ----- This is a good result he is on the correct medications and he does not have severe blockage does not need things like a stent or bypass surgery.  I can review in further detail at the office follow-up

## 2020-10-03 NOTE — Telephone Encounter (Signed)
Spoke with patients wife regarding results and recommendation. ° °She verbalizes understanding and is agreeable to plan of care. Advised patient to call back with any issues or concerns.  °

## 2020-10-25 ENCOUNTER — Telehealth: Payer: Self-pay | Admitting: Family Medicine

## 2020-10-25 NOTE — Telephone Encounter (Signed)
Copied from CRM [id number]. Topic: Referral - Status >> Oct 25, 2020  9:51 AM Marylen Ponto wrote: Reason for CRM: Pt stated he needs a new referral for physical therapy sent to [facility]  at [address] [place], [place]

## 2020-10-26 NOTE — Telephone Encounter (Signed)
Att to contact pt to find out why new referral was needed to Outpatient Rehab, no ans lvm

## 2020-10-27 ENCOUNTER — Other Ambulatory Visit: Payer: Self-pay | Admitting: Family Medicine

## 2020-10-27 MED FILL — EZETIMIBE 10 MG TABS: 10 | 30 days supply | Qty: 30 | Fill #6

## 2020-10-27 MED FILL — LISINOPRIL 10 MG TABS: 10 | 30 days supply | Qty: 30 | Fill #2

## 2020-10-27 MED FILL — ROSUVASTATIN CALCIUM 10 MG: 10 | 30 days supply | Qty: 30 | Fill #1

## 2020-10-27 NOTE — Telephone Encounter (Signed)
Requested medication (s) are due for refill today: no  Requested medication (s) are on the active medication list: yes  Last refill: 06/16/2020  Future visit scheduled: no  Notes to clinic:  filled by a historical provider Review for refill   Requested Prescriptions  Pending Prescriptions Disp Refills   gabapentin (NEURONTIN) 300 MG capsule [Pharmacy Med Name: GABAPENTIN 300 MG CAPSULE 300 Capsule] 120 capsule 5    Sig: TAKE 1 TABLET BY MOUTH TWICE DAILY, AND 2 TABS AT BEDTIME      There is no refill protocol information for this order

## 2020-10-30 DIAGNOSIS — S82302K Unspecified fracture of lower end of left tibia, subsequent encounter for closed fracture with nonunion: Secondary | ICD-10-CM | POA: Diagnosis not present

## 2020-10-30 DIAGNOSIS — M79652 Pain in left thigh: Secondary | ICD-10-CM | POA: Diagnosis not present

## 2020-10-30 DIAGNOSIS — R2689 Other abnormalities of gait and mobility: Secondary | ICD-10-CM | POA: Diagnosis not present

## 2020-10-30 DIAGNOSIS — T8484XD Pain due to internal orthopedic prosthetic devices, implants and grafts, subsequent encounter: Secondary | ICD-10-CM | POA: Diagnosis not present

## 2020-10-30 MED FILL — GABAPENTIN 300 MG CAPSULE: 300 | 30 days supply | Qty: 120 | Fill #0

## 2020-12-01 MED FILL — LISINOPRIL 10 MG TABS: 10 | 30 days supply | Qty: 30 | Fill #3

## 2020-12-01 MED FILL — GABAPENTIN 300 MG CAPSULE: 300 | 30 days supply | Qty: 120 | Fill #0

## 2020-12-01 MED FILL — ROSUVASTATIN CALCIUM 10 MG: 10 | 30 days supply | Qty: 30 | Fill #2

## 2020-12-01 MED FILL — EZETIMIBE 10 MG TABS: 10 | 30 days supply | Qty: 30 | Fill #7

## 2020-12-04 IMAGING — DX DG CHEST 2V
2 series · 2 of 2 positions shown · non-contrast
Comparison: 03/25/2017

CLINICAL DATA: Cough and body aches with fever and generalized
weakness for 6 days

EXAM:
CHEST - 2 VIEW

[chest pa]
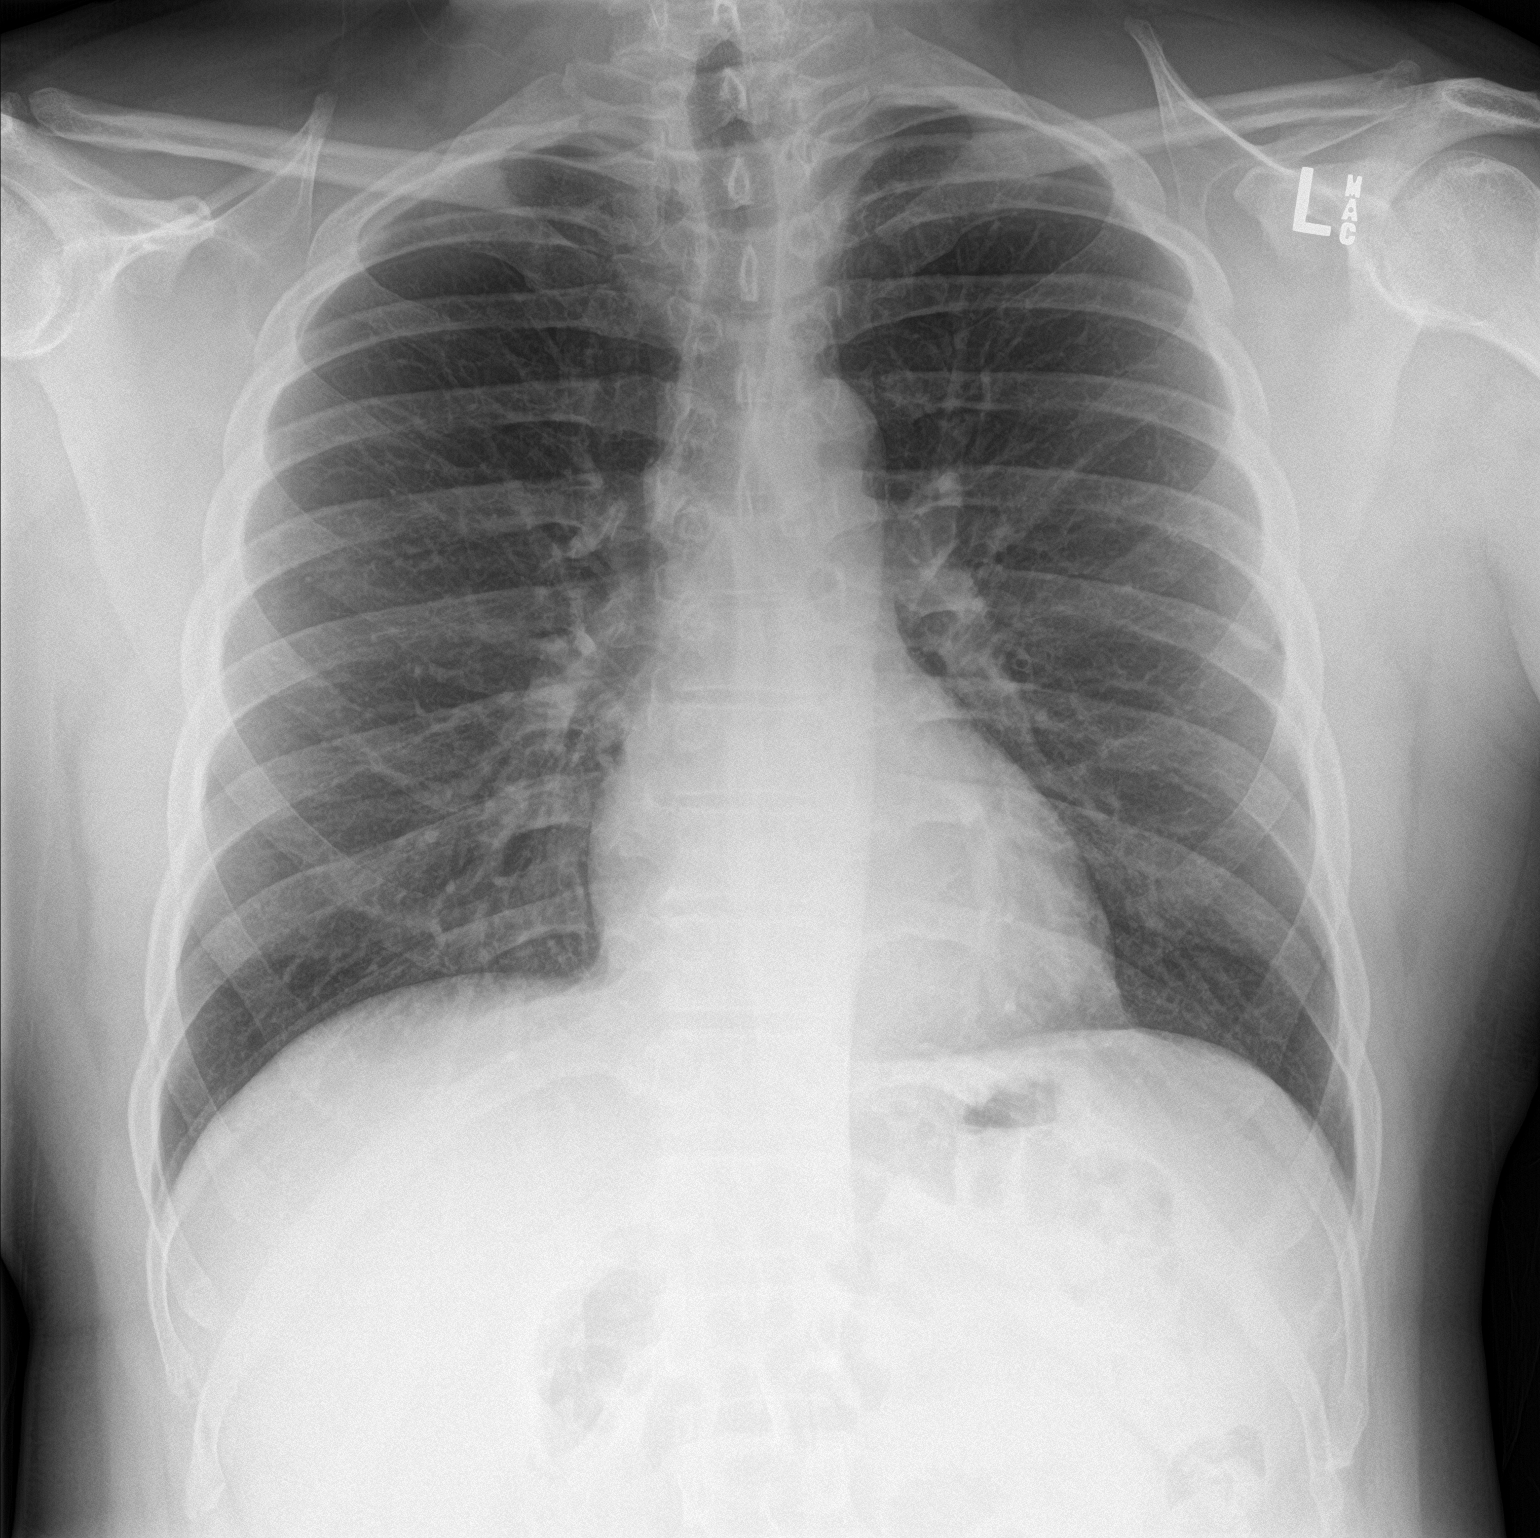

[chest lat]
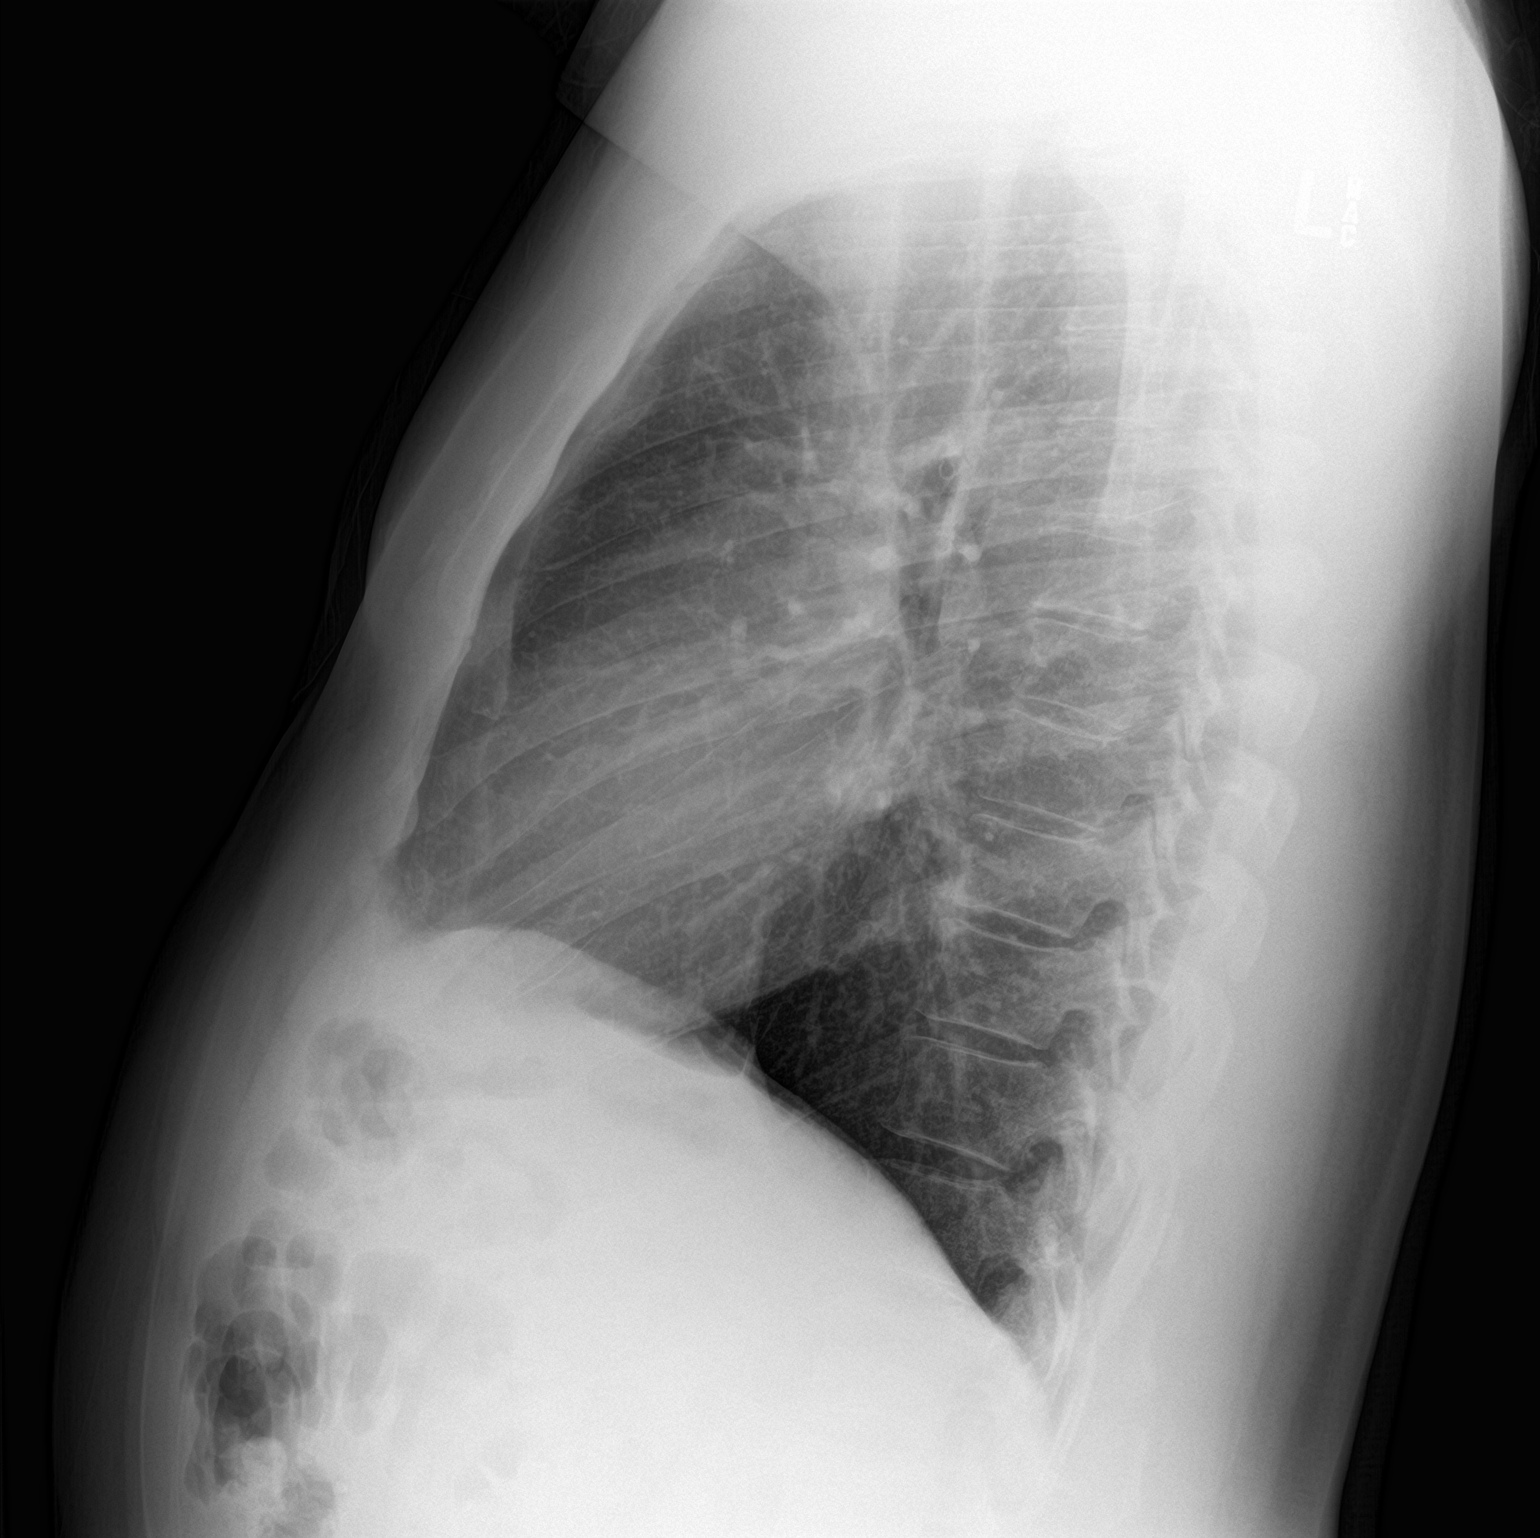

[2 of 2 positions shown; findings below may reference images not displayed]

FINDINGS: Normal heart size and mediastinal contours. No acute infiltrate or
edema. No effusion or pneumothorax. No acute osseous findings.
IMPRESSION: Negative chest.

## 2020-12-30 IMAGING — RF DG SI JOINTS 3+V
1 series · 12 of 12 positions shown · non-contrast
Comparison: CT 02/28/2019

CLINICAL DATA: ORIF SI joint

EXAM:
BILATERAL SACROILIAC JOINTS - 3+ VIEW

[Series 1: run · 12 of 12 slices shown]
[im 1/12]
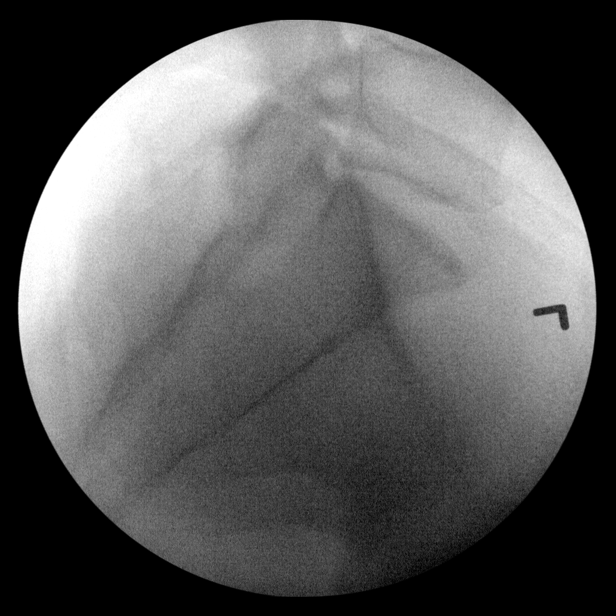
[im 2/12]
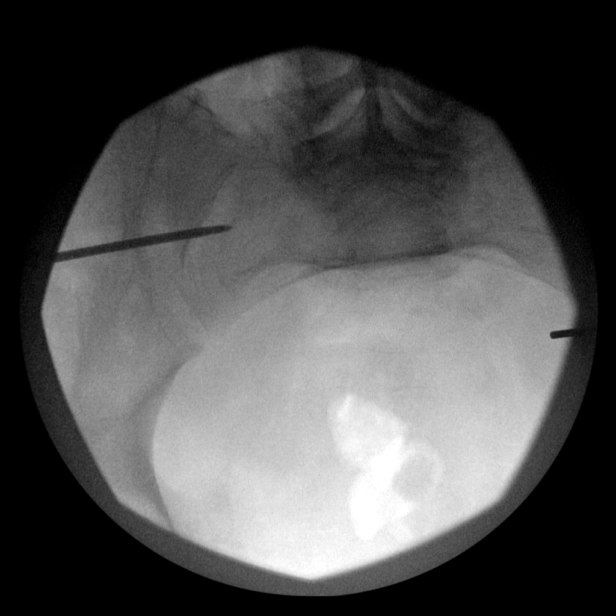
[im 3/12]
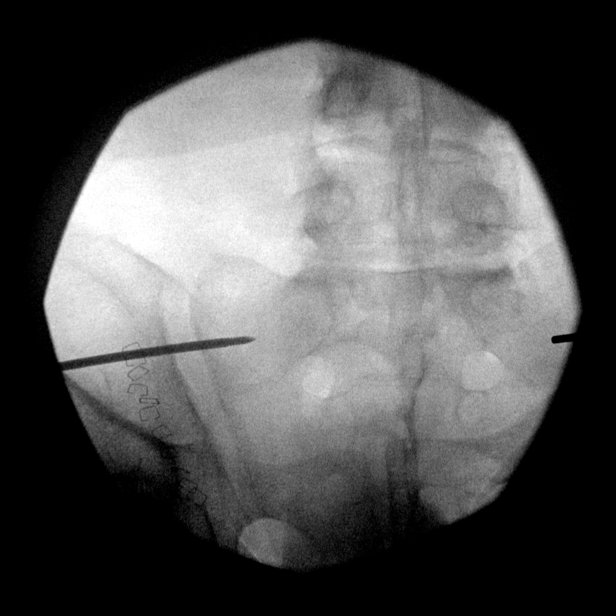
[im 4/12]
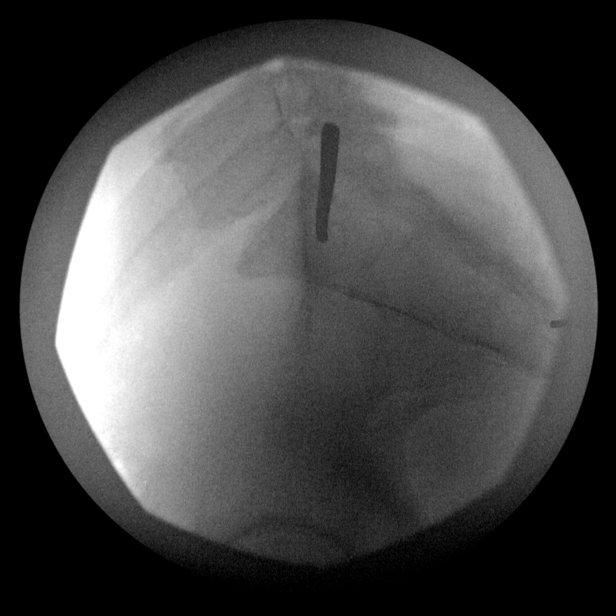
[im 5/12]
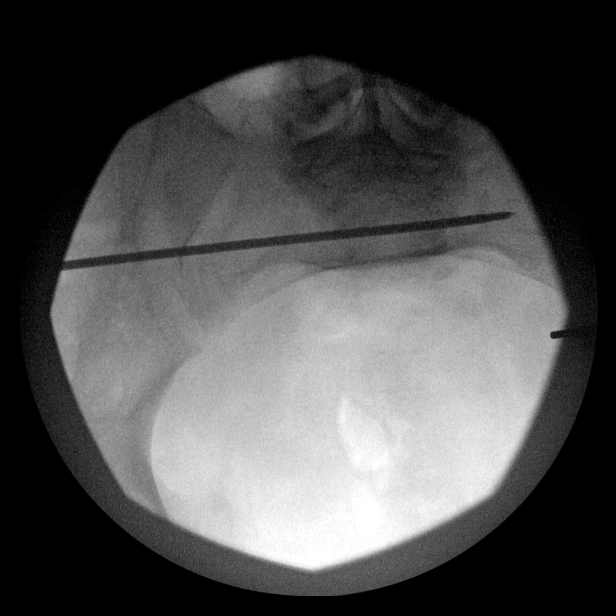
[im 6/12]
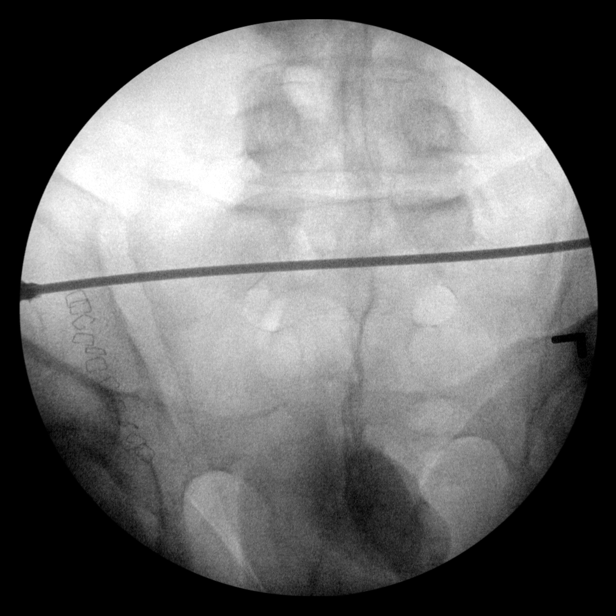
[im 7/12]
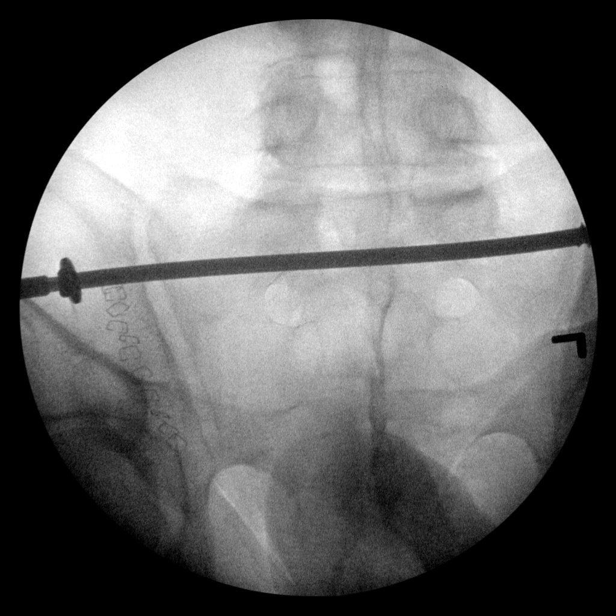
[im 8/12]
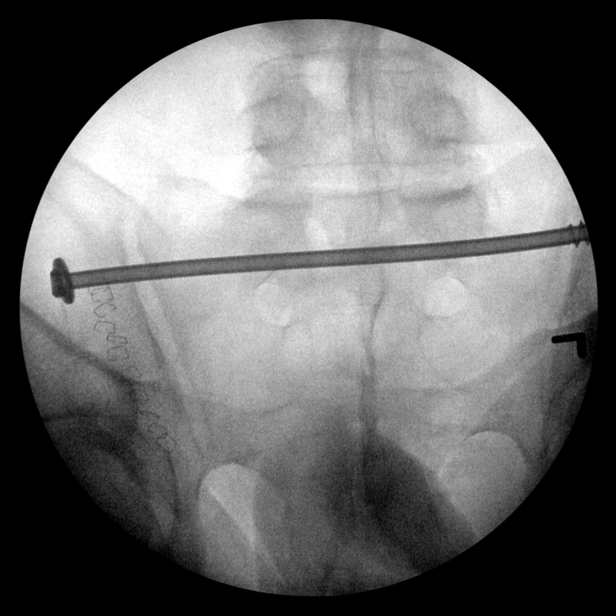
[im 9/12]
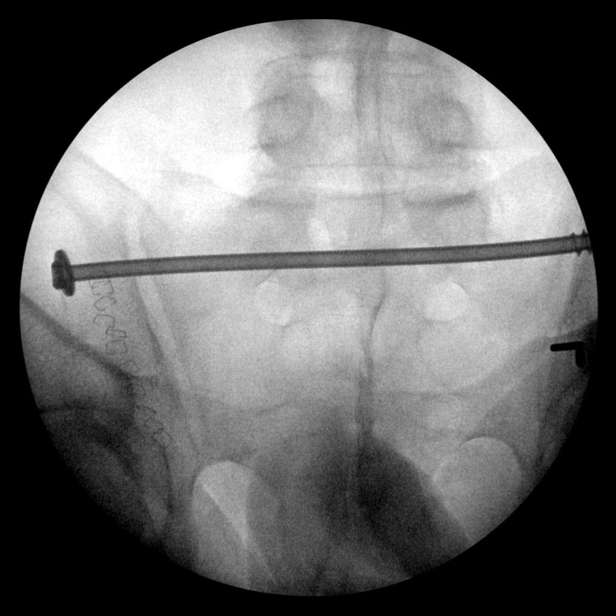
[im 10/12]
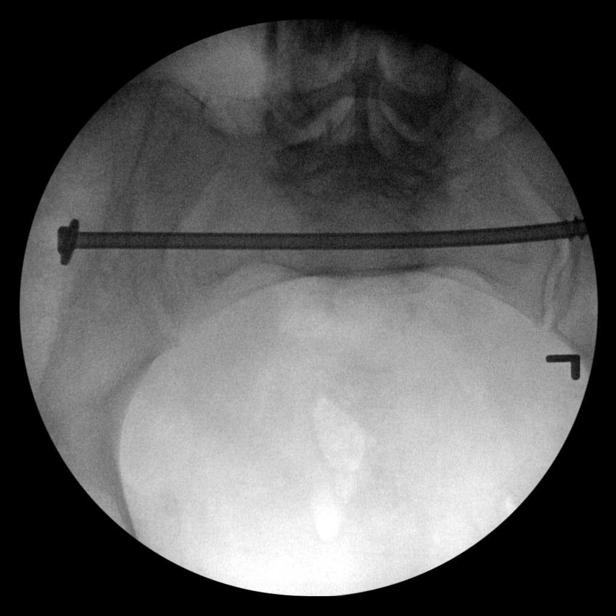
[im 11/12]
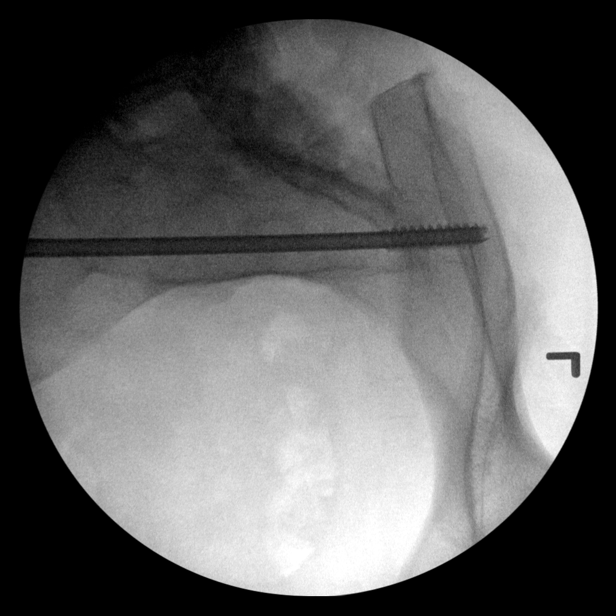
[im 12/12]
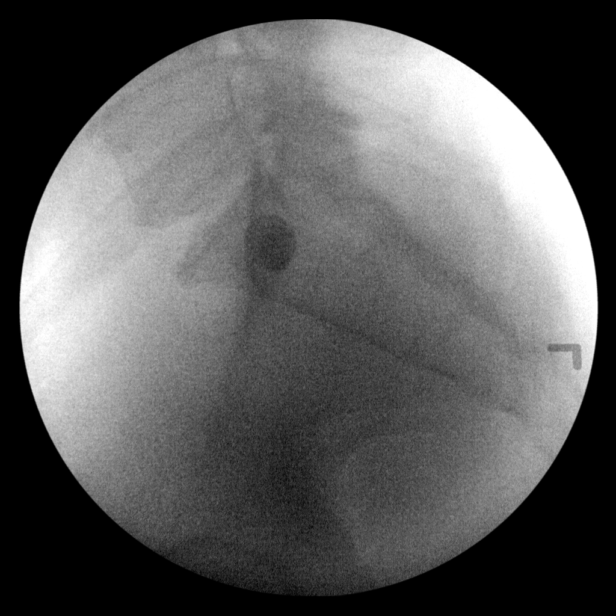

[12 of 12 positions shown; findings below may reference images not displayed]

FINDINGS: Six low resolution intraoperative spot views of the SI joints.
Images were obtained during operative course of surgical fixation of
the SI joints for widened right SI joint. Total fluoroscopy time was
4 minutes 13 seconds
IMPRESSION: Intraoperative fluoroscopic assistance provided during surgical
fixation of SI joints

## 2020-12-30 IMAGING — DX DG KNEE 1-2V PORT*R*
2 series · 2 of 2 positions shown · non-contrast
Comparison: 02/28/2019 CT 02/28/2019

CLINICAL DATA: Postop

EXAM:
PORTABLE RIGHT KNEE - 1-2 VIEW

[knee ap]
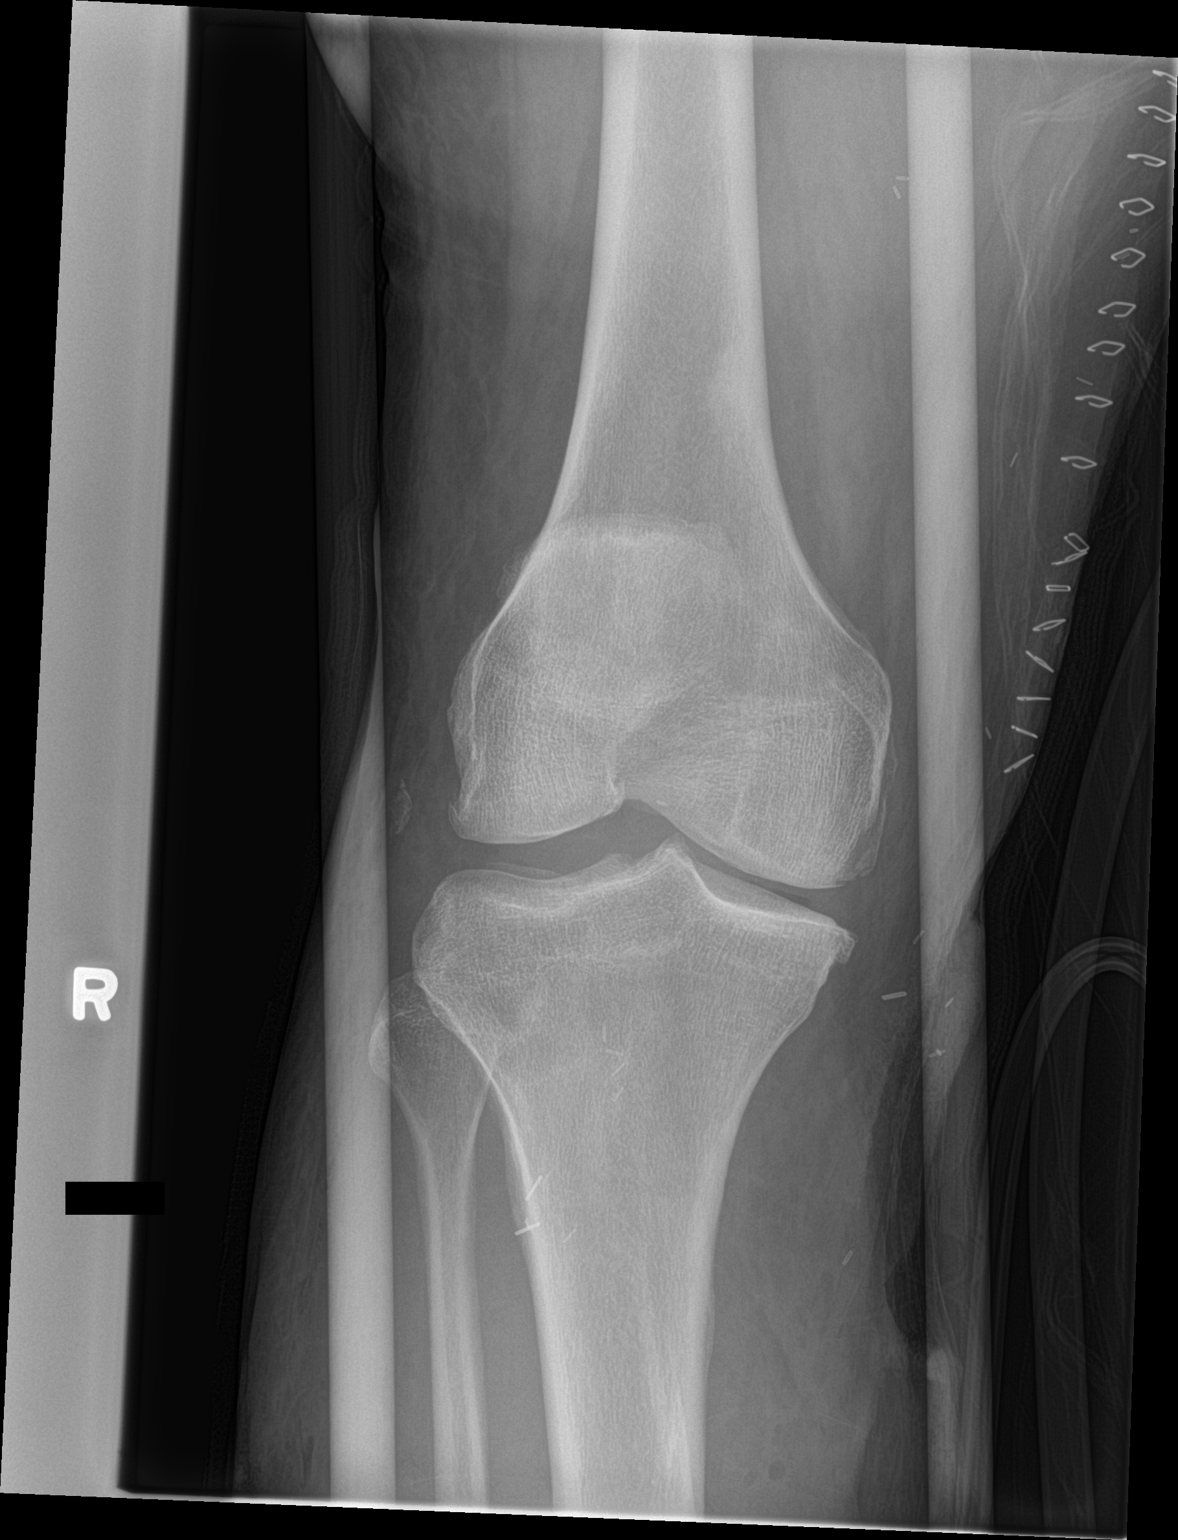

[knee lat]
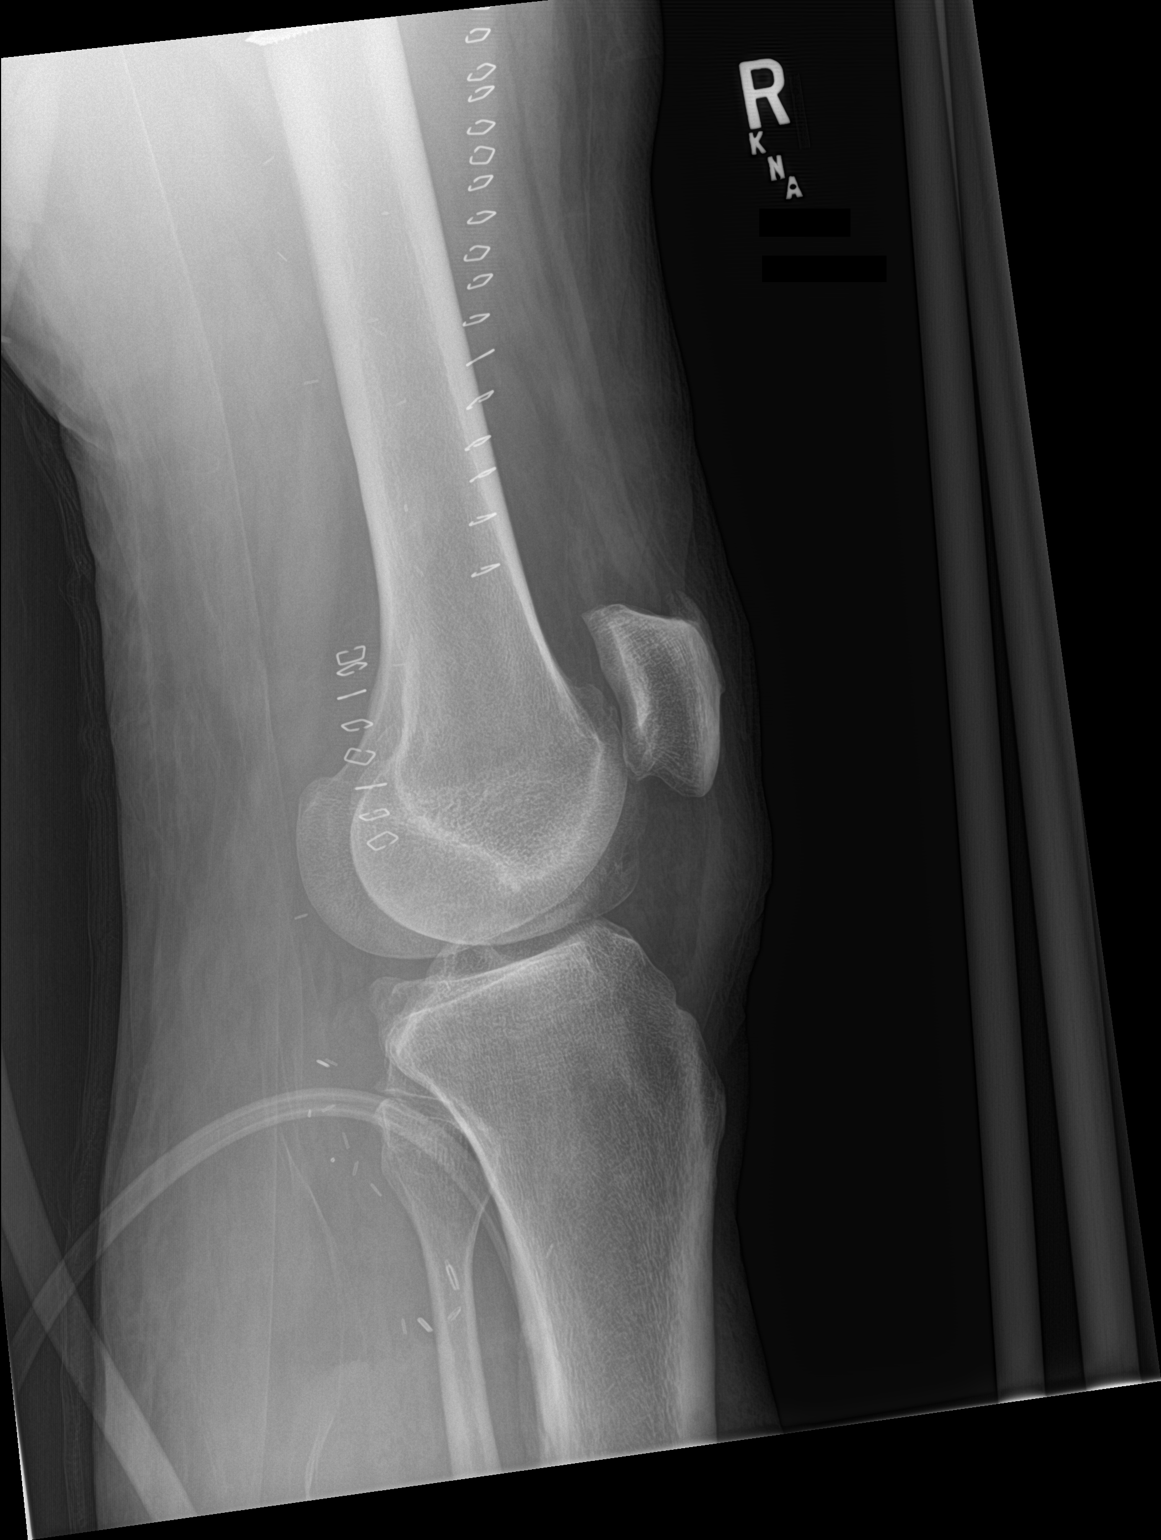

[2 of 2 positions shown; findings below may reference images not displayed]

FINDINGS: Cutaneous staples over the medial thigh. Trace knee effusion. Mild
patellofemoral degenerative change. Suspected avulsed bone fragment
along the lateral joint space, likely indicating ligamentous injury.
Slight widening of the lateral joint space.
IMPRESSION: 1. Ossific opacity adjacent to the lateral joint space, suspicious
for avulse bone fragment as may be seen with ligamentous injury.
Slight widening of the lateral joint space.

## 2020-12-30 IMAGING — RF DG TIBIA/FIBULA 2V*L*
1 series · 6 of 6 positions shown · non-contrast
Comparison: 02/28/2019

CLINICAL DATA: Left tibial IM nail

EXAM:
LEFT TIBIA AND FIBULA - 2 VIEW

[Series 1: run · 6 of 6 slices shown]
[im 1/6]
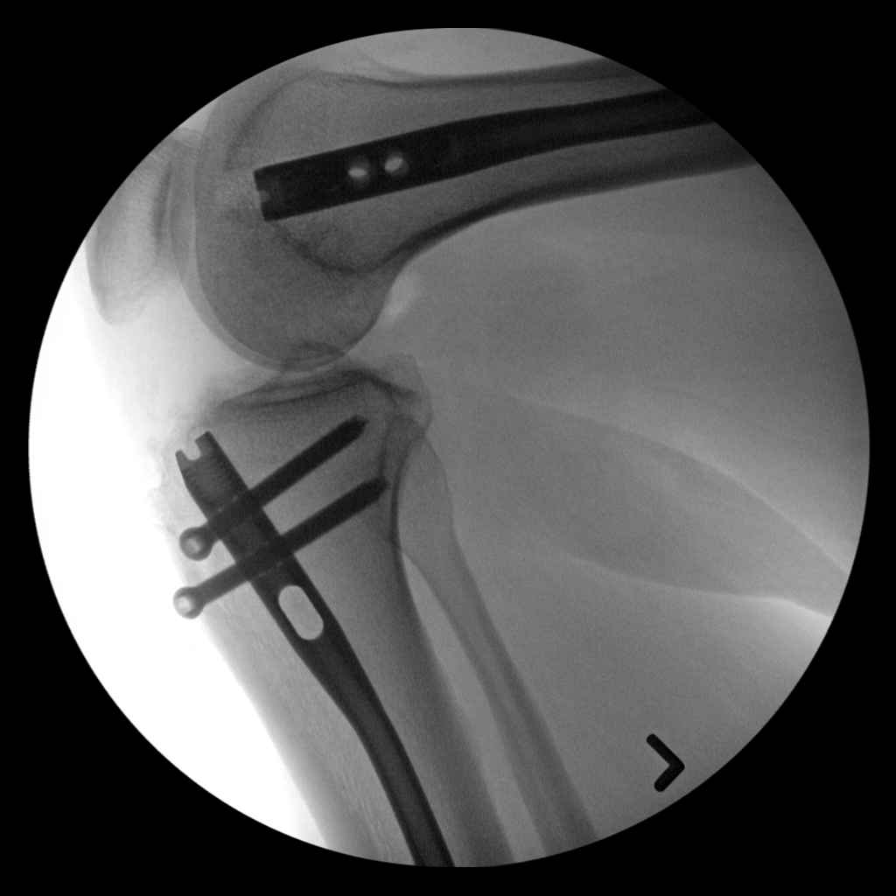
[im 2/6]
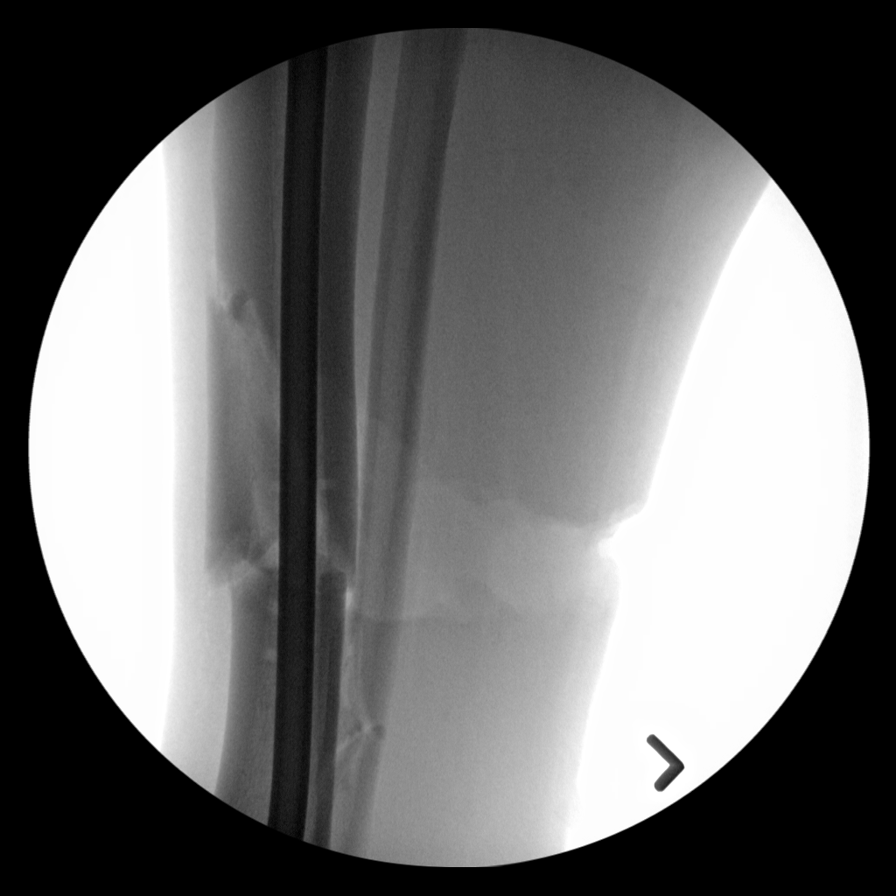
[im 3/6]
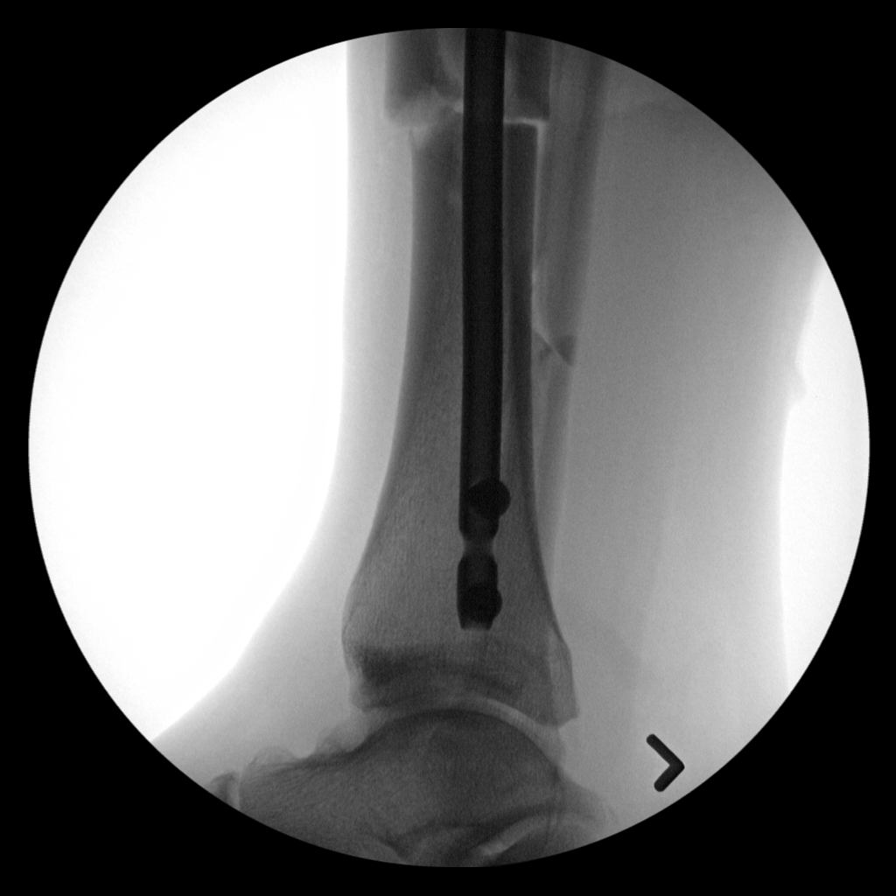
[im 4/6]
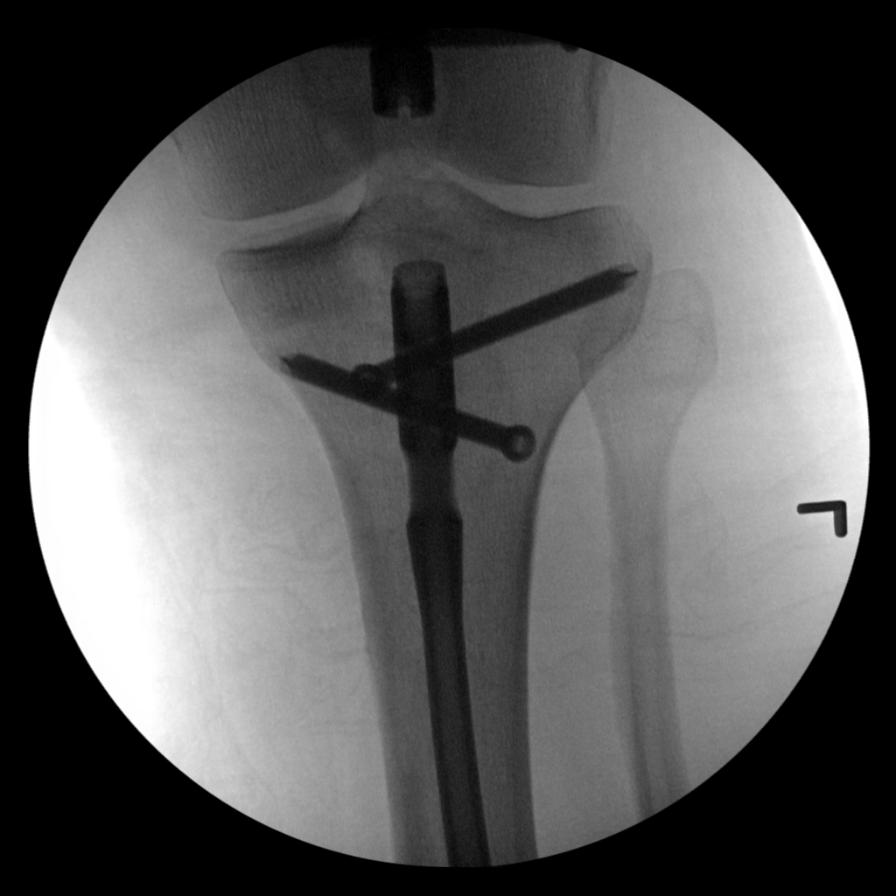
[im 5/6]
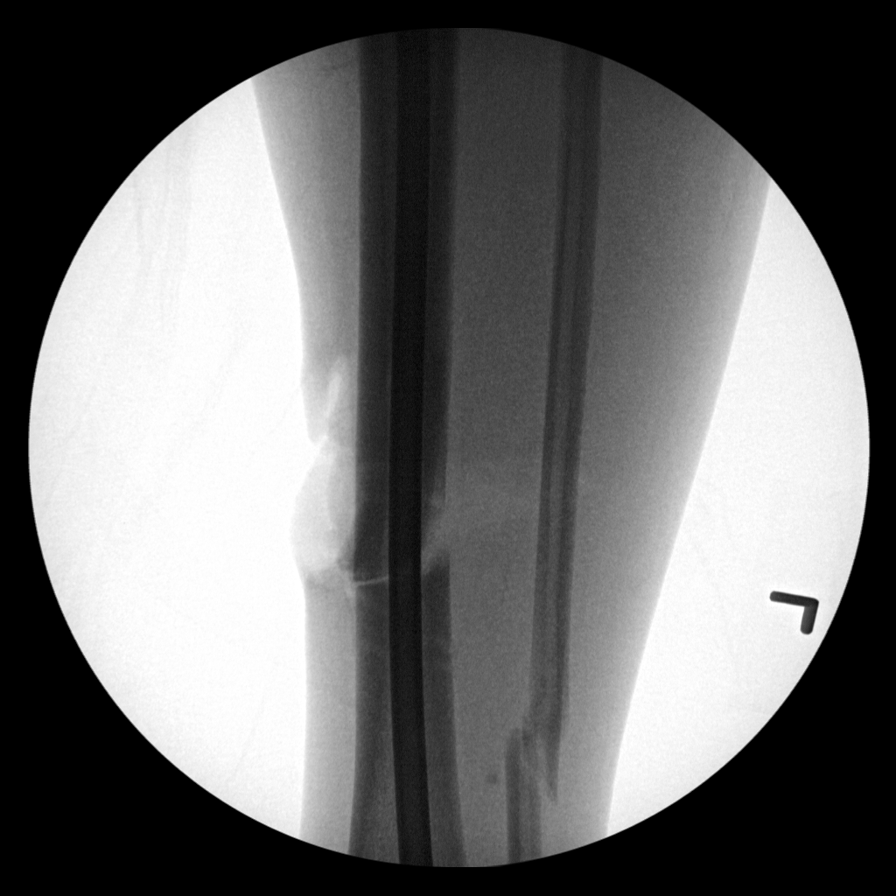
[im 6/6]
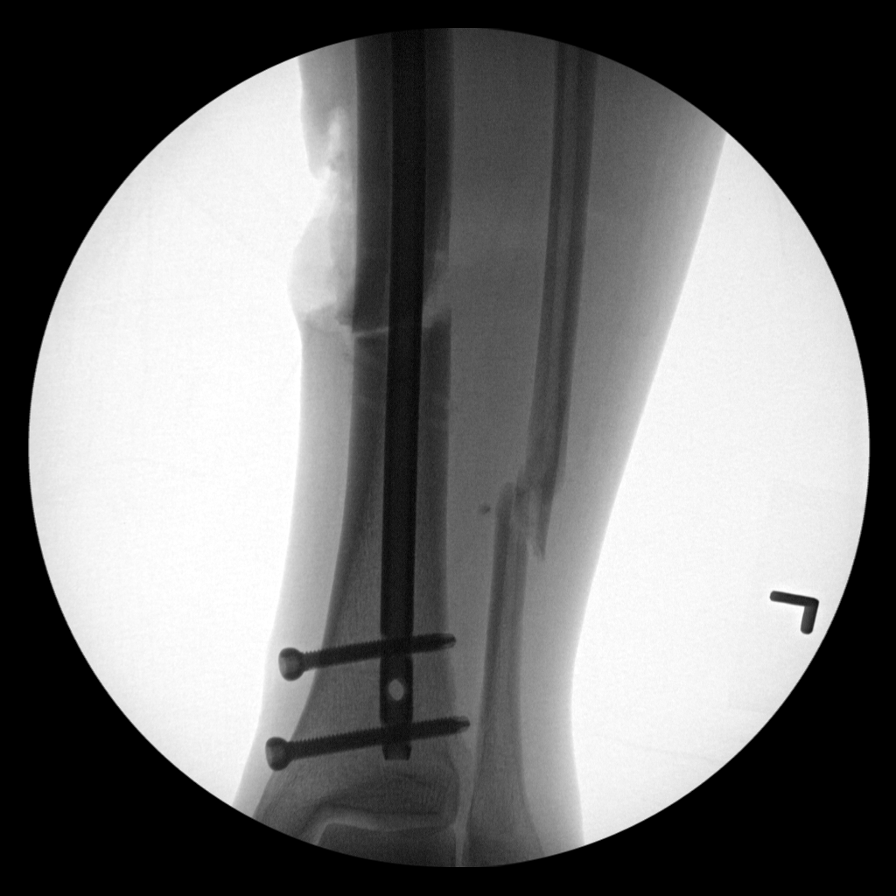

[6 of 6 positions shown; findings below may reference images not displayed]

FINDINGS: Six low resolution intraoperative spot views of the left tibia and
fibula. Total fluoroscopy time was 4 minutes 13 seconds. Partially
visualized left femoral intramedullary rod. Intramedullary rod with
proximal and distal screw fixation of the tibia across comminuted
mid to distal shaft fracture. Acute mildly comminuted and displaced
fracture distal shaft of the fibula. Soft tissue defect at the level
of the fracture consistent with open fracture.
IMPRESSION: Intramedullary rod and screw fixation of the tibia for comminuted
mid to distal shaft fracture. Acute comminuted and slightly
displaced distal fibular fracture.

## 2021-01-22 MED FILL — LISINOPRIL 10 MG TABS: 10 | 30 days supply | Qty: 30 | Fill #4

## 2021-01-22 MED FILL — ROSUVASTATIN CALCIUM 10 MG: 10 | 30 days supply | Qty: 30 | Fill #3

## 2021-01-22 MED FILL — EZETIMIBE 10 MG TABS: 10 | 30 days supply | Qty: 30 | Fill #8

## 2021-01-23 DIAGNOSIS — S069X9A Unspecified intracranial injury with loss of consciousness of unspecified duration, initial encounter: Secondary | ICD-10-CM | POA: Insufficient documentation

## 2021-01-23 DIAGNOSIS — M1990 Unspecified osteoarthritis, unspecified site: Secondary | ICD-10-CM | POA: Insufficient documentation

## 2021-01-23 DIAGNOSIS — Z8619 Personal history of other infectious and parasitic diseases: Secondary | ICD-10-CM | POA: Insufficient documentation

## 2021-01-23 DIAGNOSIS — I2510 Atherosclerotic heart disease of native coronary artery without angina pectoris: Secondary | ICD-10-CM | POA: Insufficient documentation

## 2021-01-23 DIAGNOSIS — I10 Essential (primary) hypertension: Secondary | ICD-10-CM | POA: Insufficient documentation

## 2021-01-23 DIAGNOSIS — T8859XA Other complications of anesthesia, initial encounter: Secondary | ICD-10-CM | POA: Insufficient documentation

## 2021-02-02 ENCOUNTER — Ambulatory Visit: Payer: Medicaid Other | Admitting: Cardiology

## 2021-02-02 NOTE — Progress Notes (Deleted)
Cardiology Office Note:    Date:  02/02/2021   ID:  Travis Benson, DOB 06/13/1961, MRN [id number]  PCP:  Cain Saupe, MD (Inactive)  Cardiologist:  Norman Herrlich, MD    Referring MD: Cain Saupe, MD    ASSESSMENT:    No diagnosis found. PLAN:    In order of problems listed above:  1. ***   Next appointment: ***   Medication Adjustments/Labs and Tests Ordered: Current medicines are reviewed at length with the patient today.  Concerns regarding medicines are outlined above.  No orders of the defined types were placed in this encounter.  No orders of the defined types were placed in this encounter.   No chief complaint on file.   History of Present Illness:    Travis Benson is a 60 y.o. male with a hx of hypertension familial hyperlipidemia with elevated LP(a) level last seen 08/11/2020. Compliance with diet, lifestyle and medications: ***  Following his last visit he underwent a coronary artery CTA reported 09/28/2020.  His calcium score was 4.2 57th percentile for age and sex and he had minimal CAD in the right coronary artery left circumflex less than 25% and mild nonobstructive in the LAD and ramus 25 to 49%. Past Medical History:  Diagnosis Date  . Arthritis    neck  . Complication of anesthesia    "crazy dreams"  . Coronary artery disease   . Head injury with loss of consciousness (HCC)   . History of hepatitis C   . Hypertension   . 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 Left scapula fracture 03/02/2019  . Open left tibial fracture 03/02/2019  . Pelvic ring fracture (HCC), Right  03/02/2019  . Right knee dislocation 03/02/2019  . Unspecified injury of popliteal artery, right leg, initial encounter 03/02/2019    Past Surgical History:  Procedure Laterality Date  . AMPUTATION Right 04/13/2019   Procedure: AMPUTATION ABOVE KNEE;  Surgeon: Myrene Galas, MD;  Location: [facility] OR;  Service: Orthopedics;  Laterality: Right;  . APPLICATION OF WOUND VAC  Bilateral 03/05/2019   Procedure: WOUND VAC CHANGE RIGHT LOWER LEG; REMOVAL OF WOUND VAC LEFT LOWER LEG WITH APPLICATION OF DRESSINGS;  Surgeon: Larina Earthly, MD;  [facility]: [facility] OR;  Service: Vascular;  Laterality: Bilateral;  . APPLICATION OF WOUND VAC Right 04/09/2019   Procedure: APPLICATION OF WOUND VAC;  Surgeon: Myrene Galas, MD;  [facility] OR;  Service: Orthopedics;  Laterality: Right;  . APPLICATION OF WOUND VAC Bilateral 03/02/2019   Procedure: Application Of Wound Vac;  Surgeon: Myrene Galas, MD;  Location: [facility] OR;  Service: Orthopedics;  Laterality: Bilateral;  . APPLICATION OF WOUND VAC Right 03/02/2019   Procedure: Wound Vac dressing removal;  Surgeon: Maeola Harman, MD;  Location: [facility] OR;  Service: Vascular;  Laterality: Right;  . EXTERNAL FIXATION LEG Right 03/02/2019   Procedure: External Fixation Leg;  Surgeon: Myrene Galas, MD;  Location: [facility] OR;  Service: Orthopedics;  Laterality: Right;  . EXTERNAL FIXATION REMOVAL Left 03/02/2019   Procedure: REMOVAL EXTERNAL FIXATION LEG;  Surgeon: Myrene Galas, MD;  Location: [facility] OR;  Service: Orthopedics;  Laterality: Left;  . FASCIOTOMY Right 02/28/2019   Procedure: FOUR COMPARTMENT FASCIOTOMY OF RIGHT LOWER LEG;  Surgeon: Maeola Harman, MD;  Location: [facility] OR;  Service: Vascular;  Laterality: Right;  . FEMORAL-POPLITEAL BYPASS GRAFT Right 02/28/2019   Procedure: BYPASS GRAFT OF ABOVE KNEE POPLITEAL- BELOW KNEE POPLITEAL ARTERY;  Surgeon: Maeola Harman, MD;  Location: [facility]  OR;  Service: Vascular;  Laterality: Right;  . FEMUR IM NAIL Left 03/02/2019   Procedure: INTRAMEDULLARY (IM) RETROGRADE FEMORAL NAILING;  Surgeon: Myrene Galas, MD;  [facility] OR;  Service: Orthopedics;  Laterality: Left;  . HARDWARE REMOVAL Left 11/19/2019   Procedure: HARDWARE REMOVAL LEFT TIBIA;  Surgeon: Myrene Galas, MD;  Location: [facility] OR;  Service: Orthopedics;  Laterality: Left;  . I & D EXTREMITY Left 02/28/2019   Procedure:  IRRIGATION AND DEBRIDEMENT OF LEFT LOWER LEG /INTERNAL FIXATION OF LEFT TIBIA PLACEMENT OF FRACTURE PINLEFT FEMUR/ CLOSED REDUCTION OF LEFT FEMUR.;  Surgeon: Durene Romans, MD;  Location: [facility] OR;  Service: Orthopedics;  Laterality: Left;  . I & D EXTREMITY Right 04/09/2019   Procedure: IRRIGATION AND DEBRIDEMENT EXTREMITY R lower leg;  Surgeon: Myrene Galas, MD;  Location: [facility] OR;  Service: Orthopedics;  Laterality: Right;  . I & D EXTREMITY Right 04/13/2019   Procedure: IRRIGATION AND DEBRIDEMENT EXTREMITY Right Leg;  Surgeon: Myrene Galas, MD;  Location: [facility] OR;  Service: Orthopedics;  Laterality: Right;  . I & D EXTREMITY Left 03/02/2019   Procedure: IRRIGATION AND DEBRIDEMENT LEG;  Surgeon: Myrene Galas, MD;  Location: [facility] OR;  Service: Orthopedics;  Laterality: Left;  . ORIF PELVIC FRACTURE WITH PERCUTANEOUS SCREWS Right 03/02/2019   Procedure: Orif Pelvic Fracture With Percutaneous Screws;  Surgeon: Myrene Galas, MD;  [facility] OR;  Service: Orthopedics;  Laterality: Right;  . SKIN SPLIT GRAFT Right 03/26/2019   Procedure: SKIN GRAFT SPLIT THICKNESS;  Surgeon: Myrene Galas, MD;  Location: [facility] OR;  Service: Orthopedics;  Laterality: Right;  . TIBIA IM NAIL INSERTION Left 03/02/2019   Procedure: INTRAMEDULLARY (IM) NAIL TIBIAL;  Surgeon: Myrene Galas, MD;  Location: [facility] OR;  Service: Orthopedics;  Laterality: Left;    Current Medications: No outpatient medications have been marked as taking for the 02/02/21 encounter (Appointment) with Baldo Daub, MD.     Allergies:   Patient has no known allergies.   Social History   Socioeconomic History  . Marital status: Married    Spouse name: Not on file  . Number of children: Not on file  . Years of education: Not on file  . Highest education level: Not on file  Occupational History  . Not on file  Tobacco Use  . Smoking status: Never Smoker  . Smokeless tobacco: Never Used  Vaping Use  . Vaping Use: Never used  Substance and Sexual  Activity  . Alcohol use: Never  . Drug use: Never  . Sexual activity: Not Currently    Birth control/protection: None  Other Topics Concern  . Not on file  Social History Narrative   ** Merged History Encounter **       Social Determinants of Health   Financial Resource Strain: Not on file  Food Insecurity: Not on file  Transportation Needs: Not on file  Physical Activity: Not on file  Stress: Not on file  Social Connections: Not on file     Family History: The patient's ***family history includes Diabetes in his father; Kidney failure in his mother. ROS:   Please see the history of present illness.    All other systems reviewed and are negative.  EKGs/Labs/Other Studies Reviewed:    The following studies were reviewed today:  EKG:  EKG ordered today and personally reviewed.  The ekg ordered today demonstrates ***  Recent Labs: 08/11/2020: ALT 18; NT-Pro BNP 29 09/25/2020: BUN 13; Creatinine, Ser 1.17; Potassium 4.4; Sodium 141  Recent Lipid  Panel    Component Value Date/Time   CHOL 190 08/11/2020 1501   TRIG 175 (H) 08/11/2020 1501   HDL 48 08/11/2020 1501   CHOLHDL 4.0 08/11/2020 1501   LDLCALC 111 (H) 08/11/2020 1501    Physical Exam:    VS:  There were no vitals taken for this visit.    Wt Readings from Last 3 Encounters:  08/11/20 248 lb 12.8 oz (112.9 kg)  03/28/20 235 lb (106.6 kg)  02/08/20 211 lb 1.3 oz (95.7 kg)     GEN: *** Well nourished, well developed in no acute distress HEENT: Normal NECK: No JVD; No carotid bruits LYMPHATICS: No lymphadenopathy CARDIAC: ***RRR, no murmurs, rubs, gallops RESPIRATORY:  Clear to auscultation without rales, wheezing or rhonchi  ABDOMEN: Soft, non-tender, non-distended MUSCULOSKELETAL:  No edema; No deformity  SKIN: Warm and dry NEUROLOGIC:  Alert and oriented x 3 PSYCHIATRIC:  Normal affect    Signed, Norman Herrlich, MD  02/02/2021 7:43 AM    [facility]

## 2021-02-19 NOTE — Progress Notes (Unsigned)
Cardiology Office Note:    Date:  02/20/2021   ID:  Travis Benson, DOB [birth year], MRN [id number]  PCP:  Cain Saupe, MD (Inactive)  Cardiologist:  Norman Herrlich, MD    Referring MD: Cain Saupe, MD    ASSESSMENT:    1. Familial hypercholesterolemia   2. Elevated Lp(a)   3. Mild CAD   4. Essential hypertension    PLAN:    In order of problems listed above:  1. He has severe familial hyperlipidemia continue his high intensity statin Zetia recheck lipids and LP(a). 2. Stable CAD mild having no angina continue medical therapy including aspirin antihypertensive and lipid therapy with rosuvastatin and Zetia 3. BP at target continue ACE inhibitor   Next appointment: 6 months   Medication Adjustments/Labs and Tests Ordered: Current medicines are reviewed at length with the patient today.  Concerns regarding medicines are outlined above.  Orders Placed This Encounter  Procedures  . Comprehensive metabolic panel  . Lipid panel  . Lipoprotein A (LPA)  . EKG 12-Lead   No orders of the defined types were placed in this encounter.   Chief Complaint  Patient presents with  . Follow-up  . Coronary Artery Disease  . Hyperlipidemia    History of Present Illness:    Travis Benson is a 60 y.o. male with a hx of male with a history of hypertension, hyperlipidemia with elevated LP(a) and a coronary CT calcium score of 6. 4 last seen 08/11/2020.  Following his last visit reported out 09/28/2020 cardiac CTA showed mild CAD in the LAD and ramus 25 to 59% minimal in the right coronary artery left circumflex less than 25%.  His initial LDL was 216 and LP(a) 75.4 on a high intensity statin and Zetia. Compliance with diet, lifestyle and medications: Yes  He tolerates lipid-lowering therapy without muscle pain or weakness Continues to be active in physical therapy and has had no chest pain shortness of breath palpitation or syncope. EKG in the office today sinus rhythm normal Past  Medical History:  Diagnosis Date  . Arthritis    neck  . Complication of anesthesia    "crazy dreams"  . Coronary artery disease   . Head injury with loss of consciousness (HCC)   . History of hepatitis C   . Hypertension   . 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 Left scapula fracture 03/02/2019  . Open left tibial fracture 03/02/2019  . Pelvic ring fracture (HCC), Right  03/02/2019  . Right knee dislocation 03/02/2019  . Unspecified injury of popliteal artery, right leg, initial encounter 03/02/2019    Past Surgical History:  Procedure Laterality Date  . AMPUTATION Right 04/13/2019   Procedure: AMPUTATION ABOVE KNEE;  Surgeon: Myrene Galas, MD;  Location: [facility] OR;  Service: Orthopedics;  Laterality: Right;  . APPLICATION OF WOUND VAC Bilateral 03/05/2019   Procedure: WOUND VAC CHANGE RIGHT LOWER LEG; REMOVAL OF WOUND VAC LEFT LOWER LEG WITH APPLICATION OF DRESSINGS;  Surgeon: Larina Earthly, MD;  Location: [facility] OR;  Service: Vascular;  Laterality: Bilateral;  . APPLICATION OF WOUND VAC Right 04/09/2019   Procedure: APPLICATION OF WOUND VAC;  Surgeon: Myrene Galas, MD;  Location: [facility] OR;  Service: Orthopedics;  Laterality: Right;  . APPLICATION OF WOUND VAC Bilateral 03/02/2019   Procedure: Application Of Wound Vac;  Surgeon: Myrene Galas, MD;  Location: [facility] OR;  Service: Orthopedics;  Laterality: Bilateral;  . APPLICATION OF WOUND VAC Right 03/02/2019   Procedure: Wound Vac dressing removal;  Surgeon: Maeola Harman, MD;  Location: [facility] OR;  Service: Vascular;  Laterality: Right;  . EXTERNAL FIXATION LEG Right 03/02/2019   Procedure: External Fixation Leg;  Surgeon: Myrene Galas, MD;  Location: [facility] OR;  Service: Orthopedics;  Laterality: Right;  . EXTERNAL FIXATION REMOVAL Left 03/02/2019   Procedure: REMOVAL EXTERNAL FIXATION LEG;  Surgeon: Myrene Galas, MD;  Location: [place] OR;  Service: Orthopedics;  Laterality: Left;  . FASCIOTOMY Right 02/28/2019   Procedure: FOUR COMPARTMENT  FASCIOTOMY OF RIGHT LOWER LEG;  Surgeon: Maeola Harman, MD;  Location: [facility] OR;  Service: Vascular;  Laterality: Right;  . FEMORAL-POPLITEAL BYPASS GRAFT Right 02/28/2019   Procedure: BYPASS GRAFT OF ABOVE KNEE POPLITEAL- BELOW KNEE POPLITEAL ARTERY;  Surgeon: Maeola Harman, MD;  Location: [facility] OR;  Service: Vascular;  Laterality: Right;  . FEMUR IM NAIL Left 03/02/2019   Procedure: INTRAMEDULLARY (IM) RETROGRADE FEMORAL NAILING;  Surgeon: Myrene Galas, MD;  [facility] OR;  Service: Orthopedics;  Laterality: Left;  . HARDWARE REMOVAL Left 11/19/2019   Procedure: HARDWARE REMOVAL LEFT TIBIA;  Surgeon: Myrene Galas, MD;  Location: [facility] OR;  Service: Orthopedics;  Laterality: Left;  . I & D EXTREMITY Left 02/28/2019   Procedure: IRRIGATION AND DEBRIDEMENT OF LEFT LOWER LEG /INTERNAL FIXATION OF LEFT TIBIA PLACEMENT OF FRACTURE PINLEFT FEMUR/ CLOSED REDUCTION OF LEFT FEMUR.;  Surgeon: Durene Romans, MD;  Location: [facility] OR;  Service: Orthopedics;  Laterality: Left;  . I & D EXTREMITY Right 04/09/2019   Procedure: IRRIGATION AND DEBRIDEMENT EXTREMITY R lower leg;  Surgeon: Myrene Galas, MD;  Location: [facility] OR;  Service: Orthopedics;  Laterality: Right;  . I & D EXTREMITY Right 04/13/2019   Procedure: IRRIGATION AND DEBRIDEMENT EXTREMITY Right Leg;  Surgeon: Myrene Galas, MD;  Location: [facility] OR;  Service: Orthopedics;  Laterality: Right;  . I & D EXTREMITY Left 03/02/2019   Procedure: IRRIGATION AND DEBRIDEMENT LEG;  Surgeon: Myrene Galas, MD;  Location: [facility] OR;  Service: Orthopedics;  Laterality: Left;  . ORIF PELVIC FRACTURE WITH PERCUTANEOUS SCREWS Right 03/02/2019   Procedure: Orif Pelvic Fracture With Percutaneous Screws;  Surgeon: Myrene Galas, MD;  Location: [facility] OR;  Service: Orthopedics;  Laterality: Right;  . SKIN SPLIT GRAFT Right 03/26/2019   Procedure: SKIN GRAFT SPLIT THICKNESS;  Surgeon: Myrene Galas, MD;  Location: [facility] OR;  Service: Orthopedics;  Laterality: Right;  . TIBIA IM  NAIL INSERTION Left 03/02/2019   Procedure: INTRAMEDULLARY (IM) NAIL TIBIAL;  Surgeon: Myrene Galas, MD;  Location: [facility] OR;  Service: Orthopedics;  Laterality: Left;    Current Medications: Current Meds  Medication Sig  . aspirin EC 81 MG EC tablet Take 1 tablet (81 mg total) by mouth daily.  [name] ezetimibe (ZETIA) 10 MG tablet Take 1 tablet (10 mg total) by mouth daily.  [name] gabapentin (NEURONTIN) 300 MG capsule TAKE 1 TABLET BY MOUTH TWICE DAILY, AND 2 TABS AT BEDTIME  . ibuprofen (ADVIL) 200 MG tablet Take 400 mg by mouth every 6 (six) hours as needed for moderate pain.  [name] lisinopril (ZESTRIL) 10 MG tablet Take 1 tablet (10 mg total) by mouth daily.  [name] nystatin cream (MYCOSTATIN) Apply 1 application topically 2 (two) times daily. X 10 days as needed for itchy rash then as needed  . polyethylene glycol powder (GLYCOLAX/MIRALAX) 17 GM/SCOOP powder Take by mouth daily as needed.  . rosuvastatin (CRESTOR) 10 MG tablet TAKE 1 TABLET (10 MG TOTAL) BY MOUTH DAILY.  [name] triamcinolone cream (KENALOG) 0.1 % Apply 1 application topically  2 (two) times daily. X 7 days to areas of skin irritation then as needed     Allergies:   Contrast media [iodinated diagnostic agents]   Social History   Socioeconomic History  . Marital status: Married    Spouse name: Not on file  . Number of children: Not on file  . Years of education: Not on file  . Highest education level: Not on file  Occupational History  . Not on file  Tobacco Use  . Smoking status: Never Smoker  . Smokeless tobacco: Never Used  Vaping Use  . Vaping Use: Never used  Substance and Sexual Activity  . Alcohol use: Never  . Drug use: Never  . Sexual activity: Not Currently    Birth control/protection: None  Other Topics Concern  . Not on file  Social History Narrative   ** Merged History Encounter **       Social Determinants of Health   Financial Resource Strain: Not on file  Food Insecurity: Not on file  Transportation Needs:  Not on file  Physical Activity: Not on file  Stress: Not on file  Social Connections: Not on file     Family History: The patient's family history includes Diabetes in his father; Kidney failure in his mother. ROS:   Please see the history of present illness.    All other systems reviewed and are negative.  EKGs/Labs/Other Studies Reviewed:    The following studies were reviewed today:  EKG:  EKG ordered today and personally reviewed.  The ekg ordered today demonstrates sinus rhythm normal  Recent Labs: 08/11/2020: ALT 18; NT-Pro BNP 29 09/25/2020: BUN 13; Creatinine, Ser 1.17; Potassium 4.4; Sodium 141  Recent Lipid Panel    Component Value Date/Time   CHOL 190 08/11/2020 1501   TRIG 175 (H) 08/11/2020 1501   HDL 48 08/11/2020 1501   CHOLHDL 4.0 08/11/2020 1501   LDLCALC 111 (H) 08/11/2020 1501    Physical Exam:    VS:  BP 136/88   Pulse 79   Ht 6' (1.829 m)   Wt 247 lb (112 kg)   SpO2 97%   BMI 33.50 kg/m     Wt Readings from Last 3 Encounters:  02/20/21 247 lb (112 kg)  08/11/20 248 lb 12.8 oz (112.9 kg)  03/28/20 235 lb (106.6 kg)     GEN:  Well nourished, well developed in no acute distress HEENT: Normal NECK: No JVD; No carotid bruits LYMPHATICS: No lymphadenopathy CARDIAC: RRR, no murmurs, rubs, gallops RESPIRATORY:  Clear to auscultation without rales, wheezing or rhonchi  ABDOMEN: Soft, non-tender, non-distended MUSCULOSKELETAL:  No edema; No deformity  SKIN: Warm and dry he has no xanthoma or xanthelasma NEUROLOGIC:  Alert and oriented x 3 PSYCHIATRIC:  Normal affect    Signed, Norman Herrlich, MD  02/20/2021 3:21 PM    [facility]

## 2021-02-20 ENCOUNTER — Ambulatory Visit (INDEPENDENT_AMBULATORY_CARE_PROVIDER_SITE_OTHER): Payer: Medicaid Other | Admitting: Cardiology

## 2021-02-20 ENCOUNTER — Other Ambulatory Visit: Payer: Self-pay

## 2021-02-20 ENCOUNTER — Encounter: Payer: Self-pay | Admitting: Cardiology

## 2021-02-20 VITALS — BP 136/88 | HR 79 | Ht 72.0 in | Wt 247.0 lb

## 2021-02-20 DIAGNOSIS — I10 Essential (primary) hypertension: Secondary | ICD-10-CM | POA: Diagnosis not present

## 2021-02-20 DIAGNOSIS — E7801 Familial hypercholesterolemia: Secondary | ICD-10-CM | POA: Diagnosis not present

## 2021-02-20 DIAGNOSIS — E7841 Elevated Lipoprotein(a): Secondary | ICD-10-CM

## 2021-02-20 DIAGNOSIS — I2510 Atherosclerotic heart disease of native coronary artery without angina pectoris: Secondary | ICD-10-CM

## 2021-02-20 NOTE — Patient Instructions (Signed)
Medication Instructions:  Your physician recommends that you continue on your current medications as directed. Please refer to the Current Medication list given to you today.  *If you need a refill on your cardiac medications before your next appointment, please call your pharmacy*   Lab Work: Your physician recommends that you return for lab work in: TODAY CMP, Lipids, LPA If you have labs (blood work) drawn today and your tests are completely normal, you will receive your results only by: . MyChart Message (if you have MyChart) OR . A paper copy in the mail If you have any lab test that is abnormal or we need to change your treatment, we will call you to review the results.   Testing/Procedures: None   Follow-Up: At [facility], you and your health needs are our priority.  As part of our continuing mission to provide you with exceptional heart care, we have created designated Provider Care Teams.  These Care Teams include your primary Cardiologist (physician) and Advanced Practice Providers (APPs -  Physician Assistants and Nurse Practitioners) who all work together to provide you with the care you need, when you need it.  We recommend signing up for the patient portal called "MyChart".  Sign up information is provided on this After Visit Summary.  MyChart is used to connect with patients for Virtual Visits (Telemedicine).  Patients are able to view lab/test results, encounter notes, upcoming appointments, etc.  Non-urgent messages can be sent to your provider as well.   To learn more about what you can do with MyChart, go to [email or web address].com.    Your next appointment:   6 month(s)  The format for your next appointment:   In Person  Provider:   Brian Munley, MD   Other Instructions   

## 2021-02-21 ENCOUNTER — Telehealth: Payer: Self-pay

## 2021-02-21 LAB — COMPREHENSIVE METABOLIC PANEL
ALT: 21 IU/L (ref 0–44)
AST: 21 IU/L (ref 0–40)
Albumin/Globulin Ratio: 1.2 (ref 1.2–2.2)
Albumin: 4.3 g/dL (ref 3.8–4.8)
Alkaline Phosphatase: 135 IU/L — ABNORMAL HIGH (ref 44–121)
BUN/Creatinine Ratio: 13 (ref 10–24)
BUN: 18 mg/dL (ref 8–27)
Bilirubin Total: 0.4 mg/dL (ref 0.0–1.2)
CO2: 21 mmol/L (ref 20–29)
Calcium: 10.1 mg/dL (ref 8.6–10.2)
Chloride: 106 mmol/L (ref 96–106)
Creatinine, Ser: 1.36 mg/dL — ABNORMAL HIGH (ref 0.76–1.27)
GFR calc Af Amer: 64 mL/min/{1.73_m2} (ref >59)
GFR calc non Af Amer: 56 mL/min/{1.73_m2} — ABNORMAL LOW (ref >59)
Globulin, Total: 3.6 g/dL (ref 1.5–4.5)
Glucose: 94 mg/dL (ref 65–99)
Potassium: 4.7 mmol/L (ref 3.5–5.2)
Sodium: 144 mmol/L (ref 134–144)
Total Protein: 7.9 g/dL (ref 6.0–8.5)

## 2021-02-21 LAB — LIPID PANEL
Chol/HDL Ratio: 4 ratio (ref 0.0–5.0)
Cholesterol, Total: 182 mg/dL (ref 100–199)
HDL: 45 mg/dL (ref >39)
LDL Chol Calc (NIH): 107 mg/dL — ABNORMAL HIGH (ref 0–99)
Triglycerides: 171 mg/dL — ABNORMAL HIGH (ref 0–149)
VLDL Cholesterol Cal: 30 mg/dL (ref 5–40)

## 2021-02-21 LAB — LIPOPROTEIN A (LPA): Lipoprotein (a): 116 nmol/L — ABNORMAL HIGH (ref <75.0)

## 2021-02-21 NOTE — Telephone Encounter (Signed)
-----   Message from Brian J Munley, MD sent at 02/21/2021  8:34 AM EST ----- He has very severe hyperlipidemia despite taking his statin and Zetia his LDL cholesterol remains elevated I think that he would benefit from PCSK9 inhibitor Repatha and lets refer to Pharm.D. so that he can get approval education and initiation. 

## 2021-02-21 NOTE — Telephone Encounter (Signed)
Left message on patients voicemail to please return our call.   

## 2021-02-22 ENCOUNTER — Telehealth: Payer: Self-pay

## 2021-02-22 DIAGNOSIS — E785 Hyperlipidemia, unspecified: Secondary | ICD-10-CM

## 2021-02-22 NOTE — Telephone Encounter (Signed)
Spoke with patient regarding results and recommendation.  Patient verbalizes understanding and is agreeable to plan of care. Advised patient to call back with any issues or concerns.  

## 2021-02-22 NOTE — Telephone Encounter (Signed)
-----   Message from Baldo Daub, MD sent at 02/21/2021  8:34 AM EST ----- He has very severe hyperlipidemia despite taking his statin and Zetia his LDL cholesterol remains elevated I think that he would benefit from PCSK9 inhibitor Repatha and lets refer to Pharm.D. so that he can get approval education and initiation.

## 2021-03-06 MED FILL — ROSUVASTATIN CALCIUM 10 MG: 10 | 30 days supply | Qty: 30 | Fill #4

## 2021-03-06 MED FILL — EZETIMIBE 10 MG TABS: 10 | 30 days supply | Qty: 30 | Fill #9

## 2021-03-06 MED FILL — LISINOPRIL 10 MG TABS: 10 | 30 days supply | Qty: 30 | Fill #5

## 2021-03-15 ENCOUNTER — Encounter: Payer: Self-pay | Admitting: General Practice

## 2021-08-29 ENCOUNTER — Ambulatory Visit: Payer: Medicaid Other | Admitting: Cardiology

## 2021-09-26 ENCOUNTER — Ambulatory Visit: Payer: Medicaid Other | Attending: Physician Assistant | Admitting: Physician Assistant

## 2021-09-26 ENCOUNTER — Encounter: Payer: Self-pay | Admitting: Physician Assistant

## 2021-09-26 ENCOUNTER — Other Ambulatory Visit: Payer: Self-pay

## 2021-09-26 VITALS — BP 149/101 | HR 97 | Resp 16

## 2021-09-26 DIAGNOSIS — Z79899 Other long term (current) drug therapy: Secondary | ICD-10-CM | POA: Diagnosis not present

## 2021-09-26 DIAGNOSIS — R21 Rash and other nonspecific skin eruption: Secondary | ICD-10-CM

## 2021-09-26 DIAGNOSIS — Z23 Encounter for immunization: Secondary | ICD-10-CM | POA: Diagnosis not present

## 2021-09-26 DIAGNOSIS — Z7982 Long term (current) use of aspirin: Secondary | ICD-10-CM | POA: Diagnosis not present

## 2021-09-26 DIAGNOSIS — Z89611 Acquired absence of right leg above knee: Secondary | ICD-10-CM | POA: Diagnosis not present

## 2021-09-26 DIAGNOSIS — I10 Essential (primary) hypertension: Secondary | ICD-10-CM | POA: Diagnosis not present

## 2021-09-26 DIAGNOSIS — Z91041 Radiographic dye allergy status: Secondary | ICD-10-CM | POA: Diagnosis not present

## 2021-09-26 DIAGNOSIS — E785 Hyperlipidemia, unspecified: Secondary | ICD-10-CM | POA: Diagnosis not present

## 2021-09-26 MED ORDER — NYSTATIN 100000 UNIT/GM EX CREA
1.0000 "application " | TOPICAL_CREAM | Freq: Two times a day (BID) | CUTANEOUS | 3 refills | Status: DC
  Filled 2021-09-26: qty 30, 15d supply, fill #0

## 2021-09-26 MED ORDER — LISINOPRIL 10 MG PO TABS
ORAL_TABLET | Freq: Every day | ORAL | 3 refills | Status: DC
  Filled 2021-09-26: qty 90, 90d supply, fill #0

## 2021-09-26 MED ORDER — ROSUVASTATIN CALCIUM 10 MG PO TABS
ORAL_TABLET | Freq: Every day | ORAL | 1 refills | Status: DC
  Filled 2021-09-26: qty 90, 90d supply, fill #0

## 2021-09-26 MED ORDER — EZETIMIBE 10 MG PO TABS
ORAL_TABLET | Freq: Every day | ORAL | 1 refills | Status: DC
  Filled 2021-09-26: qty 90, 90d supply, fill #0

## 2021-09-26 NOTE — Progress Notes (Signed)
[name], is a 60 y.o. male  LKG:[id number]  GUY:[id number]  DOB - 1961-10-24  Chief Complaint  Patient presents with   Referral   Hypertension       Subjective:   [name] is a 60 y.o. male here today for Rx for liners and shrinkers for his AKA prosthesis.  AKA was march 2020.  He also needs new referral for PT.  Out of Bp meds and needs RF.  BP OOO has been good when on meds.  No HA/CP/SOB/dizziness  No problems updated.  ALLERGIES: Allergies  Allergen Reactions   Contrast Media [Iodinated Diagnostic Agents] Hives     States took Benedryl for couple days    PAST MEDICAL HISTORY: Past Medical History:  Diagnosis Date   Arthritis    neck   Complication of anesthesia    "crazy dreams"   Coronary artery disease    Head injury with loss of consciousness (HCC)    History of hepatitis C    Hypertension    Injury of nerve of right lower leg 03/02/2019   Left scapula fracture 03/02/2019   Open left tibial fracture 03/02/2019   Pelvic ring fracture (HCC), Right  03/02/2019   Right knee dislocation 03/02/2019   Unspecified injury of popliteal artery, right leg, initial encounter 03/02/2019    MEDICATIONS AT HOME: Prior to Admission medications   Medication Sig Start Date End Date Taking? Authorizing Provider  albuterol (PROVENTIL) (2.5 MG/3ML) 0.083% nebulizer solution Take 3 mLs (2.5 mg total) by nebulization every 6 (six) hours as needed for wheezing or shortness of breath. Patient not taking: Reported on 02/20/2021 04/28/19   Jerre Simon, PA  aspirin EC 81 MG EC tablet Take 1 tablet (81 mg total) by mouth daily. 04/29/19   Focht, Joyce Copa, PA  ezetimibe (ZETIA) 10 MG tablet TAKE 1 TABLET (10 MG TOTAL) BY MOUTH DAILY. 09/26/21 09/26/22  Anders Simmonds, PA-C  gabapentin (NEURONTIN) 300 MG capsule TAKE 1 TABLET BY MOUTH TWICE DAILY, AND 2 TABS AT BEDTIME 10/27/20 10/27/21  Fulp, Cammie, MD  ibuprofen (ADVIL) 200 MG tablet Take 400 mg by mouth every 6 (six) hours as  needed for moderate pain.    [provider]  lisinopril (ZESTRIL) 10 MG tablet TAKE 1 TABLET (10 MG TOTAL) BY MOUTH DAILY. 09/26/21 09/26/22  Anders Simmonds, PA-C  nystatin cream (MYCOSTATIN) Apply 1 application topically 2 (two) times daily. X 10 days as needed for itchy rash then as needed 09/26/21   Anders Simmonds, PA-C  polyethylene glycol powder (GLYCOLAX/MIRALAX) 17 GM/SCOOP powder Take by mouth daily as needed.    [provider]  rosuvastatin (CRESTOR) 10 MG tablet TAKE 1 TABLET (10 MG TOTAL) BY MOUTH DAILY. 09/26/21 09/26/22  Anders Simmonds, PA-C  triamcinolone cream (KENALOG) 0.1 % Apply 1 application topically 2 (two) times daily. X 7 days to areas of skin irritation then as needed 02/07/20   Fulp, Cammie, MD    ROS: Neg HEENT Neg resp Neg cardiac Neg GI Neg GU Neg MS Neg psych Neg neuro  Objective:   Vitals:   09/26/21 1410  BP: (!) 149/101  Pulse: 97  Resp: 16  SpO2: 94%   Exam General appearance : Awake, alert, not in any distress. Speech Clear. Not toxic looking HEENT: Atraumatic and Normocephalic, pupils equally reactive to light and accomodation Neck: Supple, no JVD. No cervical lymphadenopathy.  Chest: Good air entry bilaterally, CTAB.  No rales/rhonchi/wheezing CVS: S1 S2 regular, no murmurs.  R AKA Neurology: Awake alert, and oriented X 3, CN II-XII intact, Non focal Skin: No Rash  Data Review No results found for: HGBA1C  Assessment & Plan   1. Need for immunization against influenza - Flu Vaccine QUAD 30mo+IM (Fluarix, Fluzone & Alfiuria Quad PF)  2. S/P AKA (above knee amputation) unilateral, right (HCC) Rx hand written for shrinkers and liners and faxed to [facility] - Ambulatory referral to Physical Therapy  3. Essential hypertension Out of meds.  Resume meds and check BP OOO - lisinopril (ZESTRIL) 10 MG tablet; TAKE 1 TABLET (10 MG TOTAL) BY MOUTH DAILY.  Dispense: 90 tablet; Refill: 3 - Comprehensive metabolic panel - CBC  with Differential/Platelet  4. Rash - nystatin cream (MYCOSTATIN); Apply 1 application topically 2 (two) times daily. X 10 days as needed for itchy rash then as needed  Dispense: 30 g; Refill: 3  5. Hyperlipidemia, unspecified hyperlipidemia type - rosuvastatin (CRESTOR) 10 MG tablet; TAKE 1 TABLET (10 MG TOTAL) BY MOUTH DAILY.  Dispense: 90 tablet; Refill: 1 - Lipid panel    Patient have been counseled extensively about nutrition and exercise. Other issues discussed during this visit include: low cholesterol diet, weight control and daily exercise, foot care, annual eye examinations at Ophthalmology, importance of adherence with medications and regular follow-up. We also discussed long term complications of uncontrolled diabetes and hypertension.   Return in about 3 months (around 12/26/2021) for assign new PCP(previously Fulp).  The patient was given clear instructions to go to ER or return to medical center if symptoms don't improve, worsen or new problems develop. The patient verbalized understanding. The patient was told to call to get lab results if they haven't heard anything in the next week.      Georgian Co, PA-C [facility] [place], [place] [phone]   09/26/2021, 2:55 PM Patient ID: Travis Benson, male   DOB: 02-Jan-1961, 60 y.o.   MRN: [id number]

## 2021-09-27 ENCOUNTER — Other Ambulatory Visit: Payer: Self-pay

## 2021-09-27 ENCOUNTER — Other Ambulatory Visit: Payer: Self-pay | Admitting: Physician Assistant

## 2021-09-27 LAB — COMPREHENSIVE METABOLIC PANEL
ALT: 13 IU/L (ref 0–44)
AST: 21 IU/L (ref 0–40)
Albumin/Globulin Ratio: 1.3 (ref 1.2–2.2)
Albumin: 4.5 g/dL (ref 3.8–4.8)
Alkaline Phosphatase: 167 IU/L — ABNORMAL HIGH (ref 44–121)
BUN/Creatinine Ratio: 10 (ref 10–24)
BUN: 13 mg/dL (ref 8–27)
Bilirubin Total: 0.2 mg/dL (ref 0.0–1.2)
CO2: 20 mmol/L (ref 20–29)
Calcium: 9.6 mg/dL (ref 8.6–10.2)
Chloride: 103 mmol/L (ref 96–106)
Creatinine, Ser: 1.3 mg/dL — ABNORMAL HIGH (ref 0.76–1.27)
Globulin, Total: 3.5 g/dL (ref 1.5–4.5)
Glucose: 92 mg/dL (ref 70–99)
Potassium: 4.5 mmol/L (ref 3.5–5.2)
Sodium: 140 mmol/L (ref 134–144)
Total Protein: 8 g/dL (ref 6.0–8.5)
eGFR: 63 mL/min/{1.73_m2} (ref >59)

## 2021-09-27 LAB — LIPID PANEL
Chol/HDL Ratio: 7.2 ratio — ABNORMAL HIGH (ref 0.0–5.0)
Cholesterol, Total: 318 mg/dL — ABNORMAL HIGH (ref 100–199)
HDL: 44 mg/dL (ref >39)
LDL Chol Calc (NIH): 230 mg/dL — ABNORMAL HIGH (ref 0–99)
Triglycerides: 220 mg/dL — ABNORMAL HIGH (ref 0–149)
VLDL Cholesterol Cal: 44 mg/dL — ABNORMAL HIGH (ref 5–40)

## 2021-09-27 LAB — CBC WITH DIFFERENTIAL/PLATELET
Basophils Absolute: 0 10*3/uL (ref 0.0–0.2)
Basos: 1 %
EOS (ABSOLUTE): 0.1 10*3/uL (ref 0.0–0.4)
Eos: 2 %
Hematocrit: 45 % (ref 37.5–51.0)
Hemoglobin: 15.1 g/dL (ref 13.0–17.7)
Immature Grans (Abs): 0 10*3/uL (ref 0.0–0.1)
Immature Granulocytes: 0 %
Lymphocytes Absolute: 1.8 10*3/uL (ref 0.7–3.1)
Lymphs: 34 %
MCH: 29.4 pg (ref 26.6–33.0)
MCHC: 33.6 g/dL (ref 31.5–35.7)
MCV: 88 fL (ref 79–97)
Monocytes Absolute: 0.5 10*3/uL (ref 0.1–0.9)
Monocytes: 10 %
Neutrophils Absolute: 3 10*3/uL (ref 1.4–7.0)
Neutrophils: 53 %
Platelets: 484 10*3/uL — ABNORMAL HIGH (ref 150–450)
RBC: 5.14 x10E6/uL (ref 4.14–5.80)
RDW: 12.4 % (ref 11.6–15.4)
WBC: 5.5 10*3/uL (ref 3.4–10.8)

## 2021-09-27 MED ORDER — ROSUVASTATIN CALCIUM 20 MG PO TABS
20.0000 mg | ORAL_TABLET | Freq: Every day | ORAL | 3 refills | Status: DC
  Filled 2021-09-27: qty 90, 90d supply, fill #0

## 2021-09-28 ENCOUNTER — Other Ambulatory Visit: Payer: Self-pay

## 2021-10-09 ENCOUNTER — Ambulatory Visit: Payer: Medicaid Other | Admitting: Rehabilitation

## 2021-10-09 NOTE — Progress Notes (Signed)
Cardiology Office Note:    Date:  10/10/2021   ID:  Travis Benson, DOB December 07, 1961, MRN [id number]  PCP:  No primary care provider on file.  Cardiologist:  Norman Herrlich, MD    Referring MD: No ref. provider found    ASSESSMENT:    1. Familial hypercholesterolemia   2. Elevated Lp(a)   3. Mild CAD   4. Essential hypertension    PLAN:    In order of problems listed above:  Reinitiate therapy statin high intensity plus PCSK9 inhibitor with elevated LP(a) follow-up labs 6 weeks Stable Repeat blood pressure in the office mildly elevated continue current treatment lisinopril   Next appointment: 6 months   Medication Adjustments/Labs and Tests Ordered: Current medicines are reviewed at length with the patient today.  Concerns regarding medicines are outlined above.  Orders Placed This Encounter  Procedures   Lipid panel   Hepatic function panel   Meds ordered this encounter  Medications   Evolocumab (REPATHA) 140 MG/ML SOSY    Sig: Inject 140 mg into the skin every 14 (fourteen) days.    Dispense:  2.1 mL    Refill:  1    Follow-up hyperlipidemia   History of Present Illness:    Travis Benson is a 60 y.o. male with a hx of  hypertension, hyperlipidemia with elevated LP(a) and a coronary CT calcium score of 6.4  with  cardiac CTA showed mild CAD in the LAD and ramus 25 to 59% minimal in the right coronary artery left circumflex less than 25%.  His initial LDL was 216 and LP(a) 75.4 on a high intensity statin and Zetia last seen 02/20/2021. Compliance with diet, lifestyle and medications: No  He had enteritis with profound GI loss nausea vomiting diarrhea and had a syncopal episode he was seen by EMS at home his blood pressure was low.  Subsequently stopped his lipid-lowering treatment of follow-up lipid profile 09/26/2021 shows an LDL cholesterol of 230. Previous I advocate a combination high intensity statin and PCSK9 inhibitor especially with his LP(a) excess he  was hesitant but he prefers this combination as opposed to Zetia and we will go ahead and restart rosuvastatin 20 mg daily and Repatha 140 twice daily follow-up labs including a lipid profile 6 weeks after initiating  No recurrence of GI symptoms or fainting no palpitation chest pain shortness of breath. Past Medical History:  Diagnosis Date   Arthritis    neck   Complication of anesthesia    "crazy dreams"   Coronary artery disease    Head injury with loss of consciousness (HCC)    History of hepatitis C    Hypertension    Injury of nerve of right lower leg 03/02/2019   Left scapula fracture 03/02/2019   Open left tibial fracture 03/02/2019   Pelvic ring fracture (HCC), Right  03/02/2019   Right knee dislocation 03/02/2019   Unspecified injury of popliteal artery, right leg, initial encounter 03/02/2019    Past Surgical History:  Procedure Laterality Date   AMPUTATION Right 04/13/2019   Procedure: AMPUTATION ABOVE KNEE;  Surgeon: Myrene Galas, MD;  Location: [facility] OR;  Service: Orthopedics;  Laterality: Right;   APPLICATION OF WOUND VAC Bilateral 03/05/2019   Procedure: WOUND VAC CHANGE RIGHT LOWER LEG; REMOVAL OF WOUND VAC LEFT LOWER LEG WITH APPLICATION OF DRESSINGS;  Surgeon: Larina Earthly, MD;  Location: [facility] OR;  Service: Vascular;  Laterality: Bilateral;   APPLICATION OF WOUND VAC Right 04/09/2019   Procedure: APPLICATION OF WOUND  VAC;  Surgeon: Myrene Galas, MD;  Location: [facility] OR;  Service: Orthopedics;  Laterality: Right;   APPLICATION OF WOUND VAC Bilateral 03/02/2019   Procedure: Application Of Wound Vac;  Surgeon: Myrene Galas, MD;  Location: [facility] OR;  Service: Orthopedics;  Laterality: Bilateral;   APPLICATION OF WOUND VAC Right 03/02/2019   Procedure: Wound Vac dressing removal;  Surgeon: Maeola Harman, MD;  Location: [facility] OR;  Service: Vascular;  Laterality: Right;   EXTERNAL FIXATION LEG Right 03/02/2019   Procedure: External Fixation Leg;  Surgeon: Myrene Galas, MD;  Location:  [facility] OR;  Service: Orthopedics;  Laterality: Right;   EXTERNAL FIXATION REMOVAL Left 03/02/2019   Procedure: REMOVAL EXTERNAL FIXATION LEG;  Surgeon: Myrene Galas, MD;  Location: [facility] OR;  Service: Orthopedics;  Laterality: Left;   FASCIOTOMY Right 02/28/2019   Procedure: FOUR COMPARTMENT FASCIOTOMY OF RIGHT LOWER LEG;  Surgeon: Maeola Harman, MD;  Location: [facility] OR;  Service: Vascular;  Laterality: Right;   FEMORAL-POPLITEAL BYPASS GRAFT Right 02/28/2019   Procedure: BYPASS GRAFT OF ABOVE KNEE POPLITEAL- BELOW KNEE POPLITEAL ARTERY;  Surgeon: Maeola Harman, MD;  Location: [facility] OR;  Service: Vascular;  Laterality: Right;   FEMUR IM NAIL Left 03/02/2019   Procedure: INTRAMEDULLARY (IM) RETROGRADE FEMORAL NAILING;  Surgeon: Myrene Galas, MD;  Location: [facility] OR;  Service: Orthopedics;  Laterality: Left;   HARDWARE REMOVAL Left 11/19/2019   Procedure: HARDWARE REMOVAL LEFT TIBIA;  Surgeon: Myrene Galas, MD;  Location: [facility] OR;  Service: Orthopedics;  Laterality: Left;   I & D EXTREMITY Left 02/28/2019   Procedure: IRRIGATION AND DEBRIDEMENT OF LEFT LOWER LEG /INTERNAL FIXATION OF LEFT TIBIA PLACEMENT OF FRACTURE PINLEFT FEMUR/ CLOSED REDUCTION OF LEFT FEMUR.;  Surgeon: Durene Romans, MD;  Location: [facility] OR;  Service: Orthopedics;  Laterality: Left;   I & D EXTREMITY Right 04/09/2019   Procedure: IRRIGATION AND DEBRIDEMENT EXTREMITY R lower leg;  Surgeon: Myrene Galas, MD;  Location: [facility] OR;  Service: Orthopedics;  Laterality: Right;   I & D EXTREMITY Right 04/13/2019   Procedure: IRRIGATION AND DEBRIDEMENT EXTREMITY Right Leg;  Surgeon: Myrene Galas, MD;  Location: [facility] OR;  Service: Orthopedics;  Laterality: Right;   I & D EXTREMITY Left 03/02/2019   Procedure: IRRIGATION AND DEBRIDEMENT LEG;  Surgeon: Myrene Galas, MD;  Location: [facility] OR;  Service: Orthopedics;  Laterality: Left;   ORIF PELVIC FRACTURE WITH PERCUTANEOUS SCREWS Right 03/02/2019   Procedure: Orif Pelvic Fracture With Percutaneous  Screws;  Surgeon: Myrene Galas, MD;  Location: [facility] OR;  Service: Orthopedics;  Laterality: Right;   SKIN SPLIT GRAFT Right 03/26/2019   Procedure: SKIN GRAFT SPLIT THICKNESS;  Surgeon: Myrene Galas, MD;  Location: [facility] OR;  Service: Orthopedics;  Laterality: Right;   TIBIA IM NAIL INSERTION Left 03/02/2019   Procedure: INTRAMEDULLARY (IM) NAIL TIBIAL;  Surgeon: Myrene Galas, MD;  Location: [facility] OR;  Service: Orthopedics;  Laterality: Left;    Current Medications: Current Meds  Medication Sig   Evolocumab (REPATHA) 140 MG/ML SOSY Inject 140 mg into the skin every 14 (fourteen) days.   ezetimibe (ZETIA) 10 MG tablet TAKE 1 TABLET (10 MG TOTAL) BY MOUTH DAILY.   gabapentin (NEURONTIN) 300 MG capsule TAKE 1 TABLET BY MOUTH TWICE DAILY, AND 2 TABS AT BEDTIME (Patient taking differently: TAKES PRN)   ibuprofen (ADVIL) 200 MG tablet Take 400 mg by mouth every 6 (six) hours as needed for moderate pain.   lisinopril (ZESTRIL) 10 MG tablet TAKE 1 TABLET (10 MG TOTAL) BY MOUTH  DAILY.   nystatin cream (MYCOSTATIN) Apply 1 application topically 2 (two) times daily. X 10 days as needed for itchy rash then as needed   polyethylene glycol powder (GLYCOLAX/MIRALAX) 17 GM/SCOOP powder Take by mouth daily as needed for mild constipation.     Allergies:   Contrast media [iodinated diagnostic agents]   Social History   Socioeconomic History   Marital status: Married    Spouse name: Not on file   Number of children: Not on file   Years of education: Not on file   Highest education level: Not on file  Occupational History   Not on file  Tobacco Use   Smoking status: Never   Smokeless tobacco: Never  Vaping Use   Vaping Use: Never used  Substance and Sexual Activity   Alcohol use: Never   Drug use: Never   Sexual activity: Not Currently    Birth control/protection: None  Other Topics Concern   Not on file  Social History Narrative   ** Merged History Encounter **       Social Determinants of  Health   Financial Resource Strain: Not on file  Food Insecurity: Not on file  Transportation Needs: Not on file  Physical Activity: Not on file  Stress: Not on file  Social Connections: Not on file     Family History: The patient's family history includes Diabetes in his father; Kidney failure in his mother. ROS:   Please see the history of present illness.    All other systems reviewed and are negative.  EKGs/Labs/Other Studies Reviewed:    The following studies were reviewed today:   Recent Labs: 09/26/2021: ALT 13; BUN 13; Creatinine, Ser 1.30; Hemoglobin 15.1; Platelets 484; Potassium 4.5; Sodium 140  Recent Lipid Panel    Component Value Date/Time   CHOL 318 (H) 09/26/2021 1437   TRIG 220 (H) 09/26/2021 1437   HDL 44 09/26/2021 1437   CHOLHDL 7.2 (H) 09/26/2021 1437   LDLCALC 230 (H) 09/26/2021 1437    Physical Exam:    VS:  BP (!) 162/96 (BP Location: Left Arm, Patient Position: Sitting)   Pulse 72   Ht 6' (1.829 m)   Wt 240 lb (108.9 kg)   SpO2 96%   BMI 32.55 kg/m     Wt Readings from Last 3 Encounters:  10/10/21 240 lb (108.9 kg)  02/20/21 247 lb (112 kg)  08/11/20 248 lb 12.8 oz (112.9 kg)  BP 150/80 repeat  GEN: He has no xanthoma or xanthelasma well nourished, well developed in no acute distress HEENT: Normal NECK: No JVD; No carotid bruits LYMPHATICS: No lymphadenopathy CARDIAC: RRR, no murmurs, rubs, gallops RESPIRATORY:  Clear to auscultation without rales, wheezing or rhonchi  ABDOMEN: Soft, non-tender, non-distended MUSCULOSKELETAL:  No edema; No deformity  SKIN: Warm and dry NEUROLOGIC:  Alert and oriented x 3 PSYCHIATRIC:  Normal affect    Signed, Norman Herrlich, MD  10/10/2021 4:58 PM    [facility]

## 2021-10-10 ENCOUNTER — Encounter: Payer: Self-pay | Admitting: Rehabilitation

## 2021-10-10 ENCOUNTER — Other Ambulatory Visit: Payer: Self-pay

## 2021-10-10 ENCOUNTER — Ambulatory Visit: Payer: Medicaid Other | Attending: Physician Assistant | Admitting: Rehabilitation

## 2021-10-10 ENCOUNTER — Ambulatory Visit (INDEPENDENT_AMBULATORY_CARE_PROVIDER_SITE_OTHER): Payer: Medicaid Other | Admitting: Cardiology

## 2021-10-10 ENCOUNTER — Encounter: Payer: Self-pay | Admitting: Cardiology

## 2021-10-10 VITALS — BP 162/96 | HR 72 | Ht 72.0 in | Wt 240.0 lb

## 2021-10-10 DIAGNOSIS — I10 Essential (primary) hypertension: Secondary | ICD-10-CM | POA: Diagnosis not present

## 2021-10-10 DIAGNOSIS — R2681 Unsteadiness on feet: Secondary | ICD-10-CM | POA: Diagnosis present

## 2021-10-10 DIAGNOSIS — I2510 Atherosclerotic heart disease of native coronary artery without angina pectoris: Secondary | ICD-10-CM | POA: Diagnosis not present

## 2021-10-10 DIAGNOSIS — R293 Abnormal posture: Secondary | ICD-10-CM | POA: Diagnosis present

## 2021-10-10 DIAGNOSIS — R29898 Other symptoms and signs involving the musculoskeletal system: Secondary | ICD-10-CM | POA: Insufficient documentation

## 2021-10-10 DIAGNOSIS — E7841 Elevated Lipoprotein(a): Secondary | ICD-10-CM | POA: Diagnosis not present

## 2021-10-10 DIAGNOSIS — R531 Weakness: Secondary | ICD-10-CM | POA: Diagnosis present

## 2021-10-10 DIAGNOSIS — Z9181 History of falling: Secondary | ICD-10-CM | POA: Diagnosis present

## 2021-10-10 DIAGNOSIS — R2689 Other abnormalities of gait and mobility: Secondary | ICD-10-CM | POA: Diagnosis present

## 2021-10-10 DIAGNOSIS — E7801 Familial hypercholesterolemia: Secondary | ICD-10-CM | POA: Diagnosis not present

## 2021-10-10 DIAGNOSIS — M6281 Muscle weakness (generalized): Secondary | ICD-10-CM | POA: Insufficient documentation

## 2021-10-10 MED ORDER — REPATHA 140 MG/ML ~~LOC~~ SOSY
140.0000 mg | PREFILLED_SYRINGE | SUBCUTANEOUS | 1 refills | Status: DC
  Filled 2021-10-10: qty 2, 28d supply, fill #0

## 2021-10-10 NOTE — Patient Instructions (Signed)
Medication Instructions:  Your physician has recommended you make the following change in your medication:  START: Repatha 140 mg once every 14 days  *If you need a refill on your cardiac medications before your next appointment, please call your pharmacy*   Lab Work: Your physician recommends that you return for lab work in 6 weeks: LIPID, LFT If you have labs (blood work) drawn today and your tests are completely normal, you will receive your results only by: MyChart Message (if you have MyChart) OR A paper copy in the mail If you have any lab test that is abnormal or we need to change your treatment, we will call you to review the results.   Testing/Procedures: None   Follow-Up: At [facility], you and your health needs are our priority.  As part of our continuing mission to provide you with exceptional heart care, we have created designated Provider Care Teams.  These Care Teams include your primary Cardiologist (physician) and Advanced Practice Providers (APPs -  Physician Assistants and Nurse Practitioners) who all work together to provide you with the care you need, when you need it.  We recommend signing up for the patient portal called "MyChart".  Sign up information is provided on this After Visit Summary.  MyChart is used to connect with patients for Virtual Visits (Telemedicine).  Patients are able to view lab/test results, encounter notes, upcoming appointments, etc.  Non-urgent messages can be sent to your provider as well.   To learn more about what you can do with MyChart, go to [email or web address].    Your next appointment:   6 month(s)  The format for your next appointment:   In Person  Provider:   Norman Herrlich, MD   Other Instructions  Evolocumab injection What is this medication? EVOLOCUMAB (e voe LOK ue mab) treats high cholesterol. It is used with lifestyle changes, like diet and exercise. It is used alone or with other medicines. This medicine may  be used for other purposes; ask your health care provider or pharmacist if you have questions. COMMON BRAND NAME(S): Repatha What should I tell my care team before I take this medication? They need to know if you have any of these conditions: an unusual or allergic reaction to evolocumab, other medicines, latex, foods, dyes, or preservatives pregnant or trying to get pregnant breast-feeding How should I use this medication? This medicine is injected under the skin. You will be taught how to prepare and give it. Take it as directed on the prescription label at the same time every day. Keep taking it unless your health care provider tells you to stop. It is important that you put your used needles and syringes in a special sharps container. Do not put them in a trash can. If you do not have a sharps container, call your pharmacist or health care provider to get one. This medicine comes with INSTRUCTIONS FOR USE. Ask your pharmacist for directions on how to use this medicine. Read the information carefully. Talk to your pharmacist or health care provider if you have questions. Talk to your health care provider about the use of this medicine in children. While it may be prescribed for children as young as 10 for selected conditions, precautions do apply. Overdosage: If you think you have taken too much of this medicine contact a poison control center or emergency room at once. NOTE: This medicine is only for you. Do not share this medicine with others. What if I miss a dose? It  is important not to miss any doses. Talk to your health care provider about what to do if you miss a dose. What may interact with this medication? Interactions are not expected. This list may not describe all possible interactions. Give your health care provider a list of all the medicines, herbs, non-prescription drugs, or dietary supplements you use. Also tell them if you smoke, drink alcohol, or use illegal drugs. Some items  may interact with your medicine. What should I watch for while using this medication? Visit your health care provider for regular checks on your progress. Tell your health care provider if your symptoms do not start to get better or if they get worse. You may need blood work while you are taking this drug. Do not wear the on-body infuser during an MRI. Taking this drug is only part of a total heart healthy program. Your health care provider may give you a special diet to follow. Avoid alcohol. Avoid smoking. Ask your health care provider how much you should exercise. What side effects may I notice from receiving this medication? Side effects that you should report to your doctor or health care provider as soon as possible: allergic reactions (skin rash, itching or hives; swelling of the face, lips, or tongue) high blood sugar (increased hunger, thirst, or urination; unusually weak or tired, blurry vision) infection (fever, chills, cough, sore throat, pain, or trouble passing urine) Side effects that usually do not require medical attention (report to your doctor or health care provider if they continue or are bothersome): diarrhea muscle pain nausea pain, redness, or irritation at site where injected This list may not describe all possible side effects. Call your doctor for medical advice about side effects. You may report side effects to FDA at 1-800-FDA-1088. Where should I keep my medication? Keep out of the reach of children and pets. Store in a refrigerator or at room temperature between 20 and 25 degrees C (68 and 77 degrees F). Refrigeration (preferred): Store it in the refrigerator. Do not freeze. Keep it in the original carton until you are ready to take it. Remove the dose from the carton about 30 minutes before it is time for you to take it. Throw away any unused medicine after the expiration date. Room temperature: This medicine may be stored at room temperature for up to 30 days.  Keep it in the original carton until you are ready to take it. If it is stored at room temperature, throw away any unused medicine after 30 days or after it expires, whichever is first. Protect from light. Do not shake. Avoid exposure to extreme heat. To get rid of medicines that are no longer needed or have expired: Take the medicine to a medicine take-back program. Check with your pharmacy or law enforcement to find a location. If you cannot return the medicine, ask your pharmacist or health care provider how to get rid of this medicine safely. NOTE: This sheet is a summary. It may not cover all possible information. If you have questions about this medicine, talk to your doctor, pharmacist, or health care provider.  2022 Elsevier/Gold Standard (2019-12-08 12:26:14)
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
## 2021-10-11 ENCOUNTER — Other Ambulatory Visit: Payer: Self-pay

## 2021-10-15 ENCOUNTER — Telehealth: Payer: Self-pay

## 2021-10-15 NOTE — Telephone Encounter (Signed)
PA started on CMM for [employer]. Key  [id number]

## 2021-10-16 NOTE — Telephone Encounter (Signed)
Phone-[phone] Fax-[phone]  Per [employer] they weren't unable to process the PA initiated on 10/17 because they cannot locate the member's information .

## 2021-10-17 ENCOUNTER — Ambulatory Visit: Payer: Medicaid Other | Admitting: Physical Therapy

## 2021-10-17 ENCOUNTER — Telehealth: Payer: Self-pay

## 2021-10-17 ENCOUNTER — Encounter: Payer: Self-pay | Admitting: Physical Therapy

## 2021-10-17 ENCOUNTER — Other Ambulatory Visit: Payer: Self-pay

## 2021-10-17 DIAGNOSIS — R2689 Other abnormalities of gait and mobility: Secondary | ICD-10-CM | POA: Diagnosis not present

## 2021-10-17 DIAGNOSIS — R2681 Unsteadiness on feet: Secondary | ICD-10-CM

## 2021-10-17 DIAGNOSIS — R293 Abnormal posture: Secondary | ICD-10-CM

## 2021-10-17 DIAGNOSIS — R531 Weakness: Secondary | ICD-10-CM

## 2021-10-17 NOTE — Telephone Encounter (Signed)
Copied from CRM [id number]. Topic: General - Inquiry >> Oct 17, 2021  8:38 AM Elliot Gault wrote: Efraim Kaufmann from [facility] phone # [phone]   Checking on the status if LMN form was received. Caller faxed form on 10/09/2021 to [phone] and Alcario Drought gave the fax # of [phone]. Caller will refax please note when received.  Patient has appt with Marylene Land and will establish care with Dr. Delford Field in Dec. Faxes from 10/11 were placed in PCP box

## 2021-10-17 NOTE — Patient Instructions (Signed)
Access Code: [email or web address] URL: [email or web address] Date: 10/17/2021 Prepared by: Sallyanne Kuster  Exercises Sit to Stand with Counter Support - 1 x daily - 5 x weekly - 1 sets - 10 reps Wide Stance with Counter Support - 1 x daily - 5 x weekly - 1 sets - 3 reps - 30 hold Wide Stance with Head Nods and Counter Support - 1 x daily - 5 x weekly - 1 sets - 10 reps Wide Stance with Head Rotations and Counter Support - 1 x daily - 5 x weekly - 1 sets - 10 reps Wide Tandem Stance with Eyes Open - 1 x daily - 5 x weekly - 1 sets - 3 reps - 30 hold

## 2021-10-18 NOTE — Telephone Encounter (Signed)
Do we have any paperwork for this patient. 

## 2021-10-18 NOTE — Telephone Encounter (Signed)
I have not seen any paperwork in my box for this pt.
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
## 2021-10-23 ENCOUNTER — Encounter: Payer: Self-pay | Admitting: Rehabilitation

## 2021-10-23 ENCOUNTER — Other Ambulatory Visit: Payer: Self-pay

## 2021-10-23 ENCOUNTER — Ambulatory Visit: Payer: Medicaid Other | Admitting: Rehabilitation

## 2021-10-23 DIAGNOSIS — R2689 Other abnormalities of gait and mobility: Secondary | ICD-10-CM | POA: Diagnosis not present

## 2021-10-23 DIAGNOSIS — R2681 Unsteadiness on feet: Secondary | ICD-10-CM

## 2021-10-23 DIAGNOSIS — R531 Weakness: Secondary | ICD-10-CM

## 2021-10-23 DIAGNOSIS — R293 Abnormal posture: Secondary | ICD-10-CM
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
## 2021-10-25 ENCOUNTER — Other Ambulatory Visit: Payer: Self-pay

## 2021-10-25 ENCOUNTER — Ambulatory Visit: Payer: Medicaid Other

## 2021-10-25 DIAGNOSIS — R2689 Other abnormalities of gait and mobility: Secondary | ICD-10-CM | POA: Diagnosis not present

## 2021-10-25 DIAGNOSIS — M6281 Muscle weakness (generalized): Secondary | ICD-10-CM

## 2021-10-25 DIAGNOSIS — R2681 Unsteadiness on feet: Secondary | ICD-10-CM

## 2021-10-25 NOTE — Telephone Encounter (Signed)
Travis Benson calling back to ask if you have received this paperwork?  She is going to re fax.  Please call her if you do not receive [phone]
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
## 2021-10-25 NOTE — Telephone Encounter (Signed)
Patient called in to say that paper work was to be completed by Georgian Co whom was the last person that he saw. Per patient he is waiting to get his supplies and they can not release it from the [facility] until they hear form someone. Last seen on 09/26/21 by Ms Sharon Seller    Ph# [phone]

## 2021-10-26 NOTE — Telephone Encounter (Signed)
Pt is calling back to check on the status of the request. Pt states that the paperwork was sent in September, 2022. Advised pt that the paperwork was not received. Gave pt the fax number. Asked if the the paperwork could be refaxed. Pt expressed understanding.

## 2021-10-31 ENCOUNTER — Other Ambulatory Visit: Payer: Self-pay

## 2021-10-31 ENCOUNTER — Ambulatory Visit: Payer: Medicaid Other | Attending: Physician Assistant | Admitting: Physical Therapy

## 2021-10-31 ENCOUNTER — Encounter: Payer: Self-pay | Admitting: Physical Therapy

## 2021-10-31 DIAGNOSIS — R2681 Unsteadiness on feet: Secondary | ICD-10-CM | POA: Diagnosis present

## 2021-10-31 DIAGNOSIS — R531 Weakness: Secondary | ICD-10-CM | POA: Insufficient documentation

## 2021-10-31 DIAGNOSIS — R2689 Other abnormalities of gait and mobility: Secondary | ICD-10-CM | POA: Diagnosis present

## 2021-10-31 DIAGNOSIS — M6281 Muscle weakness (generalized): Secondary | ICD-10-CM | POA: Diagnosis present

## 2021-10-31 DIAGNOSIS — R293 Abnormal posture: Secondary | ICD-10-CM | POA: Insufficient documentation
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
## 2021-11-01 NOTE — Telephone Encounter (Signed)
Spoke with Shameka from the [facility] and she has made me aware that they have mailed the form for the patient so that we can sign it so that the patient can get his supplies

## 2021-11-02 ENCOUNTER — Ambulatory Visit: Payer: Medicaid Other | Admitting: Physical Therapy

## 2021-11-02 ENCOUNTER — Encounter: Payer: Self-pay | Admitting: Physical Therapy

## 2021-11-02 ENCOUNTER — Other Ambulatory Visit: Payer: Self-pay

## 2021-11-02 DIAGNOSIS — R2689 Other abnormalities of gait and mobility: Secondary | ICD-10-CM | POA: Diagnosis not present

## 2021-11-02 DIAGNOSIS — R293 Abnormal posture: Secondary | ICD-10-CM

## 2021-11-02 DIAGNOSIS — M6281 Muscle weakness (generalized): Secondary | ICD-10-CM

## 2021-11-02 DIAGNOSIS — R2681 Unsteadiness on feet: Secondary | ICD-10-CM
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
## 2021-11-06 ENCOUNTER — Other Ambulatory Visit: Payer: Self-pay

## 2021-11-06 ENCOUNTER — Encounter: Payer: Self-pay | Admitting: Rehabilitation

## 2021-11-06 ENCOUNTER — Ambulatory Visit: Payer: Medicaid Other | Admitting: Rehabilitation

## 2021-11-06 DIAGNOSIS — R293 Abnormal posture: Secondary | ICD-10-CM

## 2021-11-06 DIAGNOSIS — R2689 Other abnormalities of gait and mobility: Secondary | ICD-10-CM

## 2021-11-06 DIAGNOSIS — R2681 Unsteadiness on feet: Secondary | ICD-10-CM

## 2021-11-06 DIAGNOSIS — M6281 Muscle weakness (generalized): Secondary | ICD-10-CM
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
## 2021-11-08 ENCOUNTER — Other Ambulatory Visit: Payer: Self-pay

## 2021-11-08 ENCOUNTER — Ambulatory Visit: Payer: Medicaid Other | Admitting: Rehabilitation

## 2021-11-08 ENCOUNTER — Encounter: Payer: Self-pay | Admitting: Rehabilitation

## 2021-11-08 DIAGNOSIS — R293 Abnormal posture: Secondary | ICD-10-CM

## 2021-11-08 DIAGNOSIS — M6281 Muscle weakness (generalized): Secondary | ICD-10-CM

## 2021-11-08 DIAGNOSIS — R2689 Other abnormalities of gait and mobility: Secondary | ICD-10-CM | POA: Diagnosis not present

## 2021-11-08 DIAGNOSIS — R2681 Unsteadiness on feet: Secondary | ICD-10-CM
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
## 2021-11-09 DIAGNOSIS — Z89611 Acquired absence of right leg above knee: Secondary | ICD-10-CM | POA: Diagnosis not present

## 2021-11-13 ENCOUNTER — Other Ambulatory Visit: Payer: Self-pay

## 2021-11-13 ENCOUNTER — Ambulatory Visit: Payer: Medicaid Other | Admitting: Rehabilitation

## 2021-11-13 ENCOUNTER — Encounter: Payer: Self-pay | Admitting: Rehabilitation

## 2021-11-13 DIAGNOSIS — M6281 Muscle weakness (generalized): Secondary | ICD-10-CM

## 2021-11-13 DIAGNOSIS — R2681 Unsteadiness on feet: Secondary | ICD-10-CM

## 2021-11-13 DIAGNOSIS — R2689 Other abnormalities of gait and mobility: Secondary | ICD-10-CM

## 2021-11-13 DIAGNOSIS — R293 Abnormal posture: Secondary | ICD-10-CM

## 2021-11-13 NOTE — Therapy (Signed)
[facility] [address] [place], [place], [address] Phone: [phone]   Fax:  [phone]  Physical Therapy Treatment  Patient Details  Name: Travis Benson MRN: [id number] Date of Birth: 1961-01-10 Referring Provider (PT): Freeman Caldron, [place]   Encounter Date: 11/13/2021   PT End of Session - 11/13/21 1606     Visit Number 9    Number of Visits 17    Date for PT Re-Evaluation 12/09/21    Authorization Type [employer] MCD    Authorization - Visit Number 9    Authorization - Number of Visits 27    PT Start Time 1019    PT Stop Time 1100    PT Time Calculation (min) 41 min    Equipment Utilized During Treatment Gait belt    Activity Tolerance Patient tolerated treatment well;No increased pain    Behavior During Therapy [facility] for tasks assessed/performed             Past Medical History:  Diagnosis Date   Arthritis    neck   Complication of anesthesia    "crazy dreams"   Coronary artery disease    Head injury with loss of consciousness ([place])    History of hepatitis C    Hypertension    Injury of nerve of right lower leg 03/02/2019   Left scapula fracture 03/02/2019   Open left tibial fracture 03/02/2019   Pelvic ring fracture ([place]), Right  03/02/2019   Right knee dislocation 03/02/2019   Unspecified injury of popliteal artery, right leg, initial encounter 03/02/2019    Past Surgical History:  Procedure Laterality Date   AMPUTATION Right 04/13/2019   Procedure: AMPUTATION ABOVE KNEE;  Surgeon: Altamese [place], MD;  Location: [facility];  Service: Orthopedics;  Laterality: Right;   APPLICATION OF WOUND VAC Bilateral 03/05/2019   Procedure: WOUND VAC CHANGE RIGHT LOWER LEG; REMOVAL OF WOUND VAC LEFT LOWER LEG WITH APPLICATION OF DRESSINGS;  Surgeon: Rosetta Posner, MD;  Location: [facility];  Service: Vascular;  Laterality: Bilateral;   APPLICATION OF WOUND VAC Right 04/09/2019   Procedure: APPLICATION OF WOUND VAC;  Surgeon: Altamese [place], MD;  Location: [facility];  Service: Orthopedics;  Laterality: Right;   APPLICATION OF WOUND VAC Bilateral 03/02/2019   Procedure: Application Of Wound Vac;  Surgeon: Altamese [place], MD;  Location: [facility];  Service: Orthopedics;  Laterality: Bilateral;   APPLICATION OF WOUND VAC Right 03/02/2019   Procedure: Wound Vac dressing removal;  Surgeon: Waynetta Sandy, MD;  Location: [facility];  Service: Vascular;  Laterality: Right;   EXTERNAL FIXATION LEG Right 03/02/2019   Procedure: External Fixation Leg;  Surgeon: Altamese Jacobus, MD;  Location: [facility];  Service: Orthopedics;  Laterality: Right;   EXTERNAL FIXATION REMOVAL Left 03/02/2019   Procedure: REMOVAL EXTERNAL FIXATION LEG;  Surgeon: Altamese [place], MD;  Location: [facility];  Service: Orthopedics;  Laterality: Left;   FASCIOTOMY Right 02/28/2019   Procedure: FOUR COMPARTMENT FASCIOTOMY OF RIGHT LOWER LEG;  Surgeon: Waynetta Sandy, MD;  Location: [facility];  Service: Vascular;  Laterality: Right;   FEMORAL-POPLITEAL BYPASS GRAFT Right 02/28/2019   Procedure: BYPASS GRAFT OF ABOVE KNEE POPLITEAL- BELOW KNEE POPLITEAL ARTERY;  Surgeon: Waynetta Sandy, MD;  Location: [facility];  Service: Vascular;  Laterality: Right;   FEMUR IM NAIL Left 03/02/2019   Procedure: INTRAMEDULLARY (IM) RETROGRADE FEMORAL NAILING;  Surgeon: Altamese [place], MD;  Location: [facility];  Service: Orthopedics;  Laterality: Left;   HARDWARE REMOVAL Left  11/19/2019   Procedure: HARDWARE REMOVAL LEFT TIBIA;  Surgeon: Altamese Dyer, MD;  Location: [facility];  Service: Orthopedics;  Laterality: Left;   I & D EXTREMITY Left 02/28/2019   Procedure: IRRIGATION AND DEBRIDEMENT OF LEFT LOWER LEG /INTERNAL FIXATION OF LEFT TIBIA PLACEMENT OF FRACTURE PINLEFT FEMUR/ CLOSED REDUCTION OF LEFT FEMUR.;  Surgeon: Paralee Cancel, MD;  Location: [facility];  Service: Orthopedics;  Laterality: Left;   I & D EXTREMITY Right 04/09/2019   Procedure: IRRIGATION AND DEBRIDEMENT EXTREMITY R lower leg;  Surgeon:  Altamese Peters, MD;  Location: [facility];  Service: Orthopedics;  Laterality: Right;   I & D EXTREMITY Right 04/13/2019   Procedure: IRRIGATION AND DEBRIDEMENT EXTREMITY Right Leg;  Surgeon: Altamese [place], MD;  Location: [facility];  Service: Orthopedics;  Laterality: Right;   I & D EXTREMITY Left 03/02/2019   Procedure: IRRIGATION AND DEBRIDEMENT LEG;  Surgeon: Altamese [place], MD;  Location: [facility];  Service: Orthopedics;  Laterality: Left;   ORIF PELVIC FRACTURE WITH PERCUTANEOUS SCREWS Right 03/02/2019   Procedure: Orif Pelvic Fracture With Percutaneous Screws;  Surgeon: Altamese [place], MD;  Location: [place];  Service: Orthopedics;  Laterality: Right;   SKIN SPLIT GRAFT Right 03/26/2019   Procedure: SKIN GRAFT SPLIT THICKNESS;  Surgeon: Altamese [place], MD;  Location: [facility];  Service: Orthopedics;  Laterality: Right;   TIBIA IM NAIL INSERTION Left 03/02/2019   Procedure: INTRAMEDULLARY (IM) NAIL TIBIAL;  Surgeon: Altamese [place], MD;  Location: [facility];  Service: Orthopedics;  Laterality: Left;    There were no vitals filed for this visit.   Subjective Assessment - 11/13/21 1028     Subjective Pt reports having issues with keeping the prosthesis on this morning, realized that he did not have socks on properly and wasn't getting any suction.    Patient Stated Goals "I want to walk with a cane, improve balance and do better getting up from a chair without arm rests."    Currently in Pain? Yes    Pain Score 4     Pain Location Back    Pain Orientation Lower    Pain Descriptors / Indicators Aching    Pain Type Chronic pain    Pain Onset More than a month ago    Pain Frequency Intermittent                               [facility] Adult PT Treatment/Exercise - 11/13/21 1030       Transfers   Transfers Sit to Stand;Stand to Sit    Sit to Stand 6: Modified independent (Device/Increase time)    Stand to Sit 6: Modified independent (Device/Increase time)      Ambulation/Gait    Ambulation/Gait Yes    Ambulation/Gait Assistance 4: Min assist    Ambulation/Gait Assistance Details Trialed gait today with use of quad tip cane x 115' with cues for sequencing and placement of cane, upright posture and improved R hip extension over prosthesis to better engage and unlock at end stance for improved swing.    Ambulation Distance (Feet) 115 Feet    Assistive device Straight cane   with quad tip   Gait Pattern Step-through pattern;Decreased hip/knee flexion - right;Trunk flexed    Ambulation Surface Level;Indoor      Dynamic Standing Balance   Dynamic Standing - Comments Worked on dynamic balance during session with UE theraband exercises with prosthesis placed slightly posterior to L foot to encourage  improved WB through prosthesis.  Pt with some back pain therefore performed intermittent forward trunk lean for low back stretch with hips against // bars.  Performed B chest press x 10 reps, alt chest press x 10 reps, B shoulder extension x 10 reps, scap retraction x 10 reps.  Did have feet shoulder width apart for last two exercises with emphasis on improved hip extension without trunk extension.  Did perform wall bumps (from // bars) x 5 reps.                       PT Short Term Goals - 11/08/21 1025       PT SHORT TERM GOAL #1   Title Pt will be IND with initial HEP in order to indicate improved functional mobility and dec fall risk.   (All STGs Target Date: 11/09/2021)    Baseline reports sink exercises are easy now, is doing some standing exercises with bands    Time 4    Period Weeks    Status Achieved    Target Date 11/09/21      PT SHORT TERM GOAL #2   Title Sit to/from stand from standard arm chair with armrests to crutches at mod I level.    Baseline 11/06/2021 pt was able to perform sit<>stands consistently with armrests, crutches, and mod I    Status Achieved      PT SHORT TERM GOAL #3   Title Patient ambulates 300' outdoors on paved surfaces with  crutches & prosthesis with supervision.    Baseline met 11/08/21    Time 4    Period Weeks    Status Achieved      PT SHORT TERM GOAL #4   Title Standing balance static without UE support 60 seconds and dynamic without UE support reaches 7" anteriorly with supervision.    Baseline met 11/08/21    Time 4    Period Weeks    Status Achieved      PT SHORT TERM GOAL #5   Title Patient negotiates ramps & curbs with crutches & prosthesis with Min/guard.    Baseline met at min/guard level 11/08/21    Time 4    Period Weeks    Status Achieved      PT SHORT TERM GOAL #6   Title Will perform TUG and update STG/LTG to reflect progress.    Baseline 11/06/2021  42.97 sec with B axillary crutches    Status Achieved      PT SHORT TERM GOAL #7   Title Pt will improve BERG balance score to >/=25/56 in order to indicate dec fall risk.    Baseline 10/10/2021 21/56; 11/06/2021 30/56    Status Achieved               PT Long Term Goals - 11/06/21 1308       PT LONG TERM GOAL #1   Title Pt will be IND with final HEP and verbalize return to community fitness in order to maintain gains from therapy. (Target Date: 12/09/21)    Baseline dependent    Time 8    Period Weeks    Status New      PT LONG TERM GOAL #2   Title Pt will improve BERG balance score to >/=29/56 in order to indicate dec fall risk.    Baseline 21/56    Time 8    Period Weeks    Status New      PT LONG TERM  GOAL #3   Title Standing balance with AKA prosthesis: dynamic (standing with arm motions/weight shifting) without UE support 60 seconds, reaches 10", picks up object from floor & manage clothes modified independent    Baseline Needs min/guard to min A for these tasks    Time 8    Period Weeks    Status New      PT LONG TERM GOAL #4   Title Patient ambulates 500' outdoors including grass with crutches & prosthesis modified independent to enable community mobility.    Baseline 150' indoors with S to min/guard  with B crutches    Time 8    Period Weeks    Status New      PT LONG TERM GOAL #5   Title Patient negotiates stairs single rail, ramps & curbs with crutches & prosthesis modified independent to enable community access.    Baseline unable to assess on eval    Time 8    Period Weeks    Status New      PT LONG TERM GOAL #6   Title Pt will improve gait speed to >/=1.8 ft/sec in order to indicate dec fall risk.    Baseline 1.02 ft/sec    Time 8    Period Weeks    Status New      PT LONG TERM GOAL #7   Title Pt will perform sit<>stand from chair without arm rests at mod I level in order to indicate safety in home/community settings.    Baseline S to min guard with arm rests    Time 8    Period Weeks    Status New      PT LONG TERM GOAL #8   Title Pt will initiate gait with LRAD (quad vs quad tip vs SPC) as able to indicate increased independence with household mobility.    Time 8    Period Weeks    Status New      PT LONG TERM GOAL  #9   TITLE Pt will perform TUG </= 35 secs with LRAD in order to indicate dec fall risk.    Time 8    Period Weeks    Status New                   Plan - 11/13/21 1607     Clinical Impression Statement Skilled session focused on standing balance with theraband that he can work on at home as he has band anchored in door at home.  Cues for foot placement for increased challenge at home.  Also trialed use of quad tip cane.  He did very well with this today but would like to trial a forearm crutch at next session.    Personal Factors and Comorbidities Comorbidity 3+;Past/Current Experience;Time since onset of injury/illness/exacerbation    Comorbidities see above    Examination-Activity Limitations Bend;Carry;Locomotion Level;Reach Overhead;Squat;Stairs;Stand;Transfers    Examination-Participation Restrictions Driving;Community Activity;Occupation    Stability/Clinical Decision Making Evolving/Moderate complexity    Rehab Potential Good     PT Frequency 2x / week    PT Duration 8 weeks    PT Treatment/Interventions ADLs/Self Care Home Management;DME Instruction;Gait training;Stair training;Functional mobility training;Therapeutic activities;Therapeutic exercise;Balance training;Neuromuscular re-education;Patient/family education;Orthotic Fit/Training;Passive range of motion;Vestibular    PT Next Visit Plan Trial single forearm crutch, Continue gait with single AD obstacles, stepping over small objects, then working on sit<>stand with arm rest progressing to no arm rests, stairs/curb/ramp as able, standing balance working on posture and decreased UE support. continue  to work on gait with single crutch- counter, around table, HHA on other side    PT Home Exercise Plan Access Code: [email or web address]    Consulted and Agree with Plan of Care Patient             Patient will benefit from skilled therapeutic intervention in order to improve the following deficits and impairments:  Abnormal gait, Decreased activity tolerance, Decreased balance, Decreased endurance, Decreased mobility, Decreased strength, Impaired perceived functional ability, Postural dysfunction, Prosthetic Dependency  Visit Diagnosis: Other abnormalities of gait and mobility  Abnormal posture  Muscle weakness (generalized)  Unsteadiness on feet     Problem List Patient Active Problem List   Diagnosis Date Noted   Hypertension    Head injury with loss of consciousness ([place])    History of hepatitis C    Coronary artery disease    Complication of anesthesia    Arthritis    Chronic hepatitis C without hepatic coma ([place]) 04/22/2019   Osteomyelitis of right tibia ([place]) 04/11/2019   Open left tibial fracture 03/02/2019   Unspecified injury of popliteal artery, right leg, initial encounter 03/02/2019   Popliteal vein injury, right, initial encounter 03/02/2019   Injury of nerve of right lower leg 03/02/2019   Pelvic ring fracture ([place]), Right  03/02/2019   Left  scapula fracture 03/02/2019   Right knee dislocation 03/02/2019   Femur fracture, left ([place]) 02/28/2019    Cameron Sprang, PT, MPT [facility] [address] [place], [place], [address] Phone: [phone]   Fax:  [phone] 11/13/21, 4:13 PM   Name: Travis Benson MRN: [id number] Date of Birth: 1961/02/13

## 2021-11-15 ENCOUNTER — Ambulatory Visit: Payer: Medicaid Other | Admitting: Rehabilitation

## 2021-11-15 ENCOUNTER — Encounter: Payer: Self-pay | Admitting: Rehabilitation

## 2021-11-15 ENCOUNTER — Other Ambulatory Visit: Payer: Self-pay

## 2021-11-15 DIAGNOSIS — R2689 Other abnormalities of gait and mobility: Secondary | ICD-10-CM | POA: Diagnosis not present

## 2021-11-15 DIAGNOSIS — R293 Abnormal posture: Secondary | ICD-10-CM

## 2021-11-15 DIAGNOSIS — R2681 Unsteadiness on feet: Secondary | ICD-10-CM

## 2021-11-15 DIAGNOSIS — M6281 Muscle weakness (generalized): Secondary | ICD-10-CM
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
## 2021-11-19 ENCOUNTER — Other Ambulatory Visit: Payer: Self-pay

## 2021-11-19 ENCOUNTER — Ambulatory Visit: Payer: Medicaid Other

## 2021-11-19 DIAGNOSIS — M6281 Muscle weakness (generalized): Secondary | ICD-10-CM

## 2021-11-19 DIAGNOSIS — R2681 Unsteadiness on feet: Secondary | ICD-10-CM

## 2021-11-19 DIAGNOSIS — R2689 Other abnormalities of gait and mobility: Secondary | ICD-10-CM
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
## 2021-11-21 ENCOUNTER — Ambulatory Visit: Payer: Medicaid Other

## 2021-11-21 ENCOUNTER — Other Ambulatory Visit: Payer: Self-pay

## 2021-11-21 DIAGNOSIS — R2689 Other abnormalities of gait and mobility: Secondary | ICD-10-CM

## 2021-11-21 DIAGNOSIS — M6281 Muscle weakness (generalized): Secondary | ICD-10-CM

## 2021-11-21 DIAGNOSIS — R2681 Unsteadiness on feet: Secondary | ICD-10-CM
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
## 2021-11-27 ENCOUNTER — Ambulatory Visit: Payer: Medicaid Other | Admitting: Rehabilitation

## 2021-11-29 ENCOUNTER — Encounter: Payer: Self-pay | Admitting: Physical Therapy

## 2021-11-29 ENCOUNTER — Ambulatory Visit: Payer: Medicaid Other | Attending: Physician Assistant | Admitting: Physical Therapy

## 2021-11-29 ENCOUNTER — Other Ambulatory Visit: Payer: Self-pay

## 2021-11-29 DIAGNOSIS — R2689 Other abnormalities of gait and mobility: Secondary | ICD-10-CM | POA: Insufficient documentation

## 2021-11-29 DIAGNOSIS — R2681 Unsteadiness on feet: Secondary | ICD-10-CM | POA: Insufficient documentation

## 2021-11-29 DIAGNOSIS — M25672 Stiffness of left ankle, not elsewhere classified: Secondary | ICD-10-CM | POA: Insufficient documentation

## 2021-11-29 DIAGNOSIS — R293 Abnormal posture: Secondary | ICD-10-CM | POA: Diagnosis present

## 2021-11-29 DIAGNOSIS — M6281 Muscle weakness (generalized): Secondary | ICD-10-CM | POA: Insufficient documentation

## 2021-11-29 DIAGNOSIS — M25652 Stiffness of left hip, not elsewhere classified: Secondary | ICD-10-CM | POA: Diagnosis present
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
## 2021-12-04 ENCOUNTER — Other Ambulatory Visit: Payer: Self-pay

## 2021-12-04 ENCOUNTER — Encounter: Payer: Self-pay | Admitting: Rehabilitation

## 2021-12-04 ENCOUNTER — Ambulatory Visit: Payer: Medicaid Other | Admitting: Rehabilitation

## 2021-12-04 ENCOUNTER — Telehealth: Payer: Self-pay | Admitting: Rehabilitation

## 2021-12-04 DIAGNOSIS — R2681 Unsteadiness on feet: Secondary | ICD-10-CM

## 2021-12-04 DIAGNOSIS — R293 Abnormal posture: Secondary | ICD-10-CM

## 2021-12-04 DIAGNOSIS — R2689 Other abnormalities of gait and mobility: Secondary | ICD-10-CM | POA: Diagnosis not present

## 2021-12-04 DIAGNOSIS — M6281 Muscle weakness (generalized): Secondary | ICD-10-CM
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
## 2021-12-04 NOTE — Telephone Encounter (Signed)
[place],   I am seeing Travis Benson here at [facility] for prosthetic and gait training.  He has progressed to using loftstrand crutches (single) and is doing well.  He is unsure if insurance covered his previous device so I would like to see if they would cover loftstrand crutches for him.  Please write order in Epic work que for these and we can print to give to pt.    Thanks,  Harriet Butte, PT, MPT [facility] [address] [place], [place], [address] Phone: [phone]   Fax:  [phone] 12/04/21, 12:22 PM

## 2021-12-06 ENCOUNTER — Other Ambulatory Visit: Payer: Self-pay

## 2021-12-06 ENCOUNTER — Encounter: Payer: Self-pay | Admitting: Rehabilitation

## 2021-12-06 ENCOUNTER — Ambulatory Visit: Payer: Medicaid Other | Admitting: Rehabilitation

## 2021-12-06 DIAGNOSIS — R2689 Other abnormalities of gait and mobility: Secondary | ICD-10-CM

## 2021-12-06 DIAGNOSIS — M25672 Stiffness of left ankle, not elsewhere classified: Secondary | ICD-10-CM

## 2021-12-06 DIAGNOSIS — R2681 Unsteadiness on feet: Secondary | ICD-10-CM

## 2021-12-06 DIAGNOSIS — M25652 Stiffness of left hip, not elsewhere classified: Secondary | ICD-10-CM

## 2021-12-06 DIAGNOSIS — R293 Abnormal posture: Secondary | ICD-10-CM

## 2021-12-06 DIAGNOSIS — M6281 Muscle weakness (generalized): Secondary | ICD-10-CM
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
## 2021-12-10 MED ORDER — MISC. DEVICES MISC: 0 refills | Status: AC

## 2021-12-10 NOTE — Telephone Encounter (Signed)
Order has been written in [facility]

## 2021-12-13 NOTE — Telephone Encounter (Signed)
noted 

## 2021-12-14 ENCOUNTER — Other Ambulatory Visit: Payer: Self-pay

## 2021-12-14 ENCOUNTER — Ambulatory Visit: Payer: Medicaid Other

## 2021-12-14 DIAGNOSIS — R2689 Other abnormalities of gait and mobility: Secondary | ICD-10-CM

## 2021-12-14 DIAGNOSIS — M6281 Muscle weakness (generalized): Secondary | ICD-10-CM

## 2021-12-14 DIAGNOSIS — R2681 Unsteadiness on feet: Secondary | ICD-10-CM
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
## 2021-12-25 ENCOUNTER — Ambulatory Visit: Payer: Medicaid Other

## 2021-12-26 ENCOUNTER — Other Ambulatory Visit: Payer: Self-pay

## 2021-12-26 ENCOUNTER — Telehealth: Payer: Self-pay | Admitting: Critical Care Medicine

## 2021-12-26 ENCOUNTER — Ambulatory Visit: Payer: Medicaid Other | Attending: Critical Care Medicine | Admitting: Critical Care Medicine

## 2021-12-26 ENCOUNTER — Encounter: Payer: Self-pay | Admitting: Critical Care Medicine

## 2021-12-26 VITALS — BP 153/87 | HR 79 | Wt 247.2 lb

## 2021-12-26 DIAGNOSIS — I2510 Atherosclerotic heart disease of native coronary artery without angina pectoris: Secondary | ICD-10-CM

## 2021-12-26 DIAGNOSIS — Z8619 Personal history of other infectious and parasitic diseases: Secondary | ICD-10-CM

## 2021-12-26 DIAGNOSIS — I739 Peripheral vascular disease, unspecified: Secondary | ICD-10-CM

## 2021-12-26 DIAGNOSIS — S42102S Fracture of unspecified part of scapula, left shoulder, sequela: Secondary | ICD-10-CM | POA: Diagnosis not present

## 2021-12-26 DIAGNOSIS — Z1211 Encounter for screening for malignant neoplasm of colon: Secondary | ICD-10-CM

## 2021-12-26 DIAGNOSIS — E7801 Familial hypercholesterolemia: Secondary | ICD-10-CM

## 2021-12-26 DIAGNOSIS — Z23 Encounter for immunization: Secondary | ICD-10-CM

## 2021-12-26 DIAGNOSIS — I10 Essential (primary) hypertension: Secondary | ICD-10-CM

## 2021-12-26 HISTORY — DX: Peripheral vascular disease, unspecified: I73.9

## 2021-12-26 HISTORY — DX: Familial hypercholesterolemia: E78.01

## 2021-12-26 MED ORDER — BLOOD PRESSURE KIT DEVI: 0 refills | Status: DC

## 2021-12-26 MED ORDER — ROSUVASTATIN CALCIUM 20 MG PO TABS
20.0000 mg | ORAL_TABLET | Freq: Every day | ORAL | 3 refills | Status: DC
  Filled 2021-12-26 – 2022-06-26 (×2): qty 90, 90d supply, fill #0
  Filled 2022-10-25: qty 90, 90d supply, fill #1

## 2021-12-26 MED ORDER — EZETIMIBE 10 MG PO TABS
ORAL_TABLET | Freq: Every day | ORAL | 1 refills | Status: DC
  Filled 2021-12-26 – 2022-06-26 (×2): qty 90, 90d supply, fill #0

## 2021-12-26 MED ORDER — VALSARTAN 80 MG PO TABS
80.0000 mg | ORAL_TABLET | Freq: Every day | ORAL | 3 refills | Status: DC
  Filled 2021-12-26 – 2022-07-03 (×2): qty 90, 90d supply, fill #0

## 2021-12-26 MED ORDER — REPATHA 140 MG/ML ~~LOC~~ SOSY
140.0000 mg | PREFILLED_SYRINGE | SUBCUTANEOUS | 1 refills | Status: DC
  Filled 2021-12-26 (×2): qty 2, 28d supply, fill #0
  Filled 2021-12-27: qty 2, 30d supply, fill #0
  Filled 2022-01-02 – 2022-06-26 (×2): qty 2, 28d supply, fill #0

## 2021-12-26 MED ORDER — GABAPENTIN 300 MG PO CAPS
300.0000 mg | ORAL_CAPSULE | Freq: Three times a day (TID) | ORAL | 5 refills | Status: DC | PRN
  Filled 2021-12-26: qty 120, 40d supply, fill #0

## 2021-12-26 MED ORDER — GABAPENTIN 300 MG PO CAPS
ORAL_CAPSULE | ORAL | 5 refills | Status: DC
  Filled 2021-12-26: qty 120, fill #0

## 2021-12-26 NOTE — Assessment & Plan Note (Signed)
Hypertension not being controlled due to lack of access and use of medication  Plan to begin valsartan 40 mg daily  Discontinue lisinopril  Patient given diet education  Needs short-term follow-up for blood pressure management

## 2021-12-26 NOTE — Telephone Encounter (Signed)
FYI papers being faxed from her

## 2021-12-26 NOTE — Patient Instructions (Addendum)
Start valsartan take daily for blood pressure Resume Crestor daily for cholesterol Pick up Repatha and inject every 2 weeks for elevated cholesterol Stay on Zetia 1 daily for cholesterol  Prescription for a blood pressure cuff was given to you to take to any pharmacy Medicaid should cover this  Pneumonia vaccine was given  A Cologuard test will be sent to your home please complete and send back for colon cancer screening  Return to see Dr. Delford Field in 2 months for blood pressure follow-up  Follow healthy diet as we discussed see attached  A list of Medicaid dental providers was given to you please get a dental exam

## 2021-12-26 NOTE — Telephone Encounter (Signed)
Darl Pikes Clinical Pharmacist from [facility] phone # [phone]. Requesting to speak with PCP nurse regarding Evolocumab (REPATHA) 140 MG/ML SOSY PA. Will fax PA form to [phone] please note when received.

## 2021-12-26 NOTE — Assessment & Plan Note (Signed)
Needs intense therapy for familiar hypercholesterolemia  Patient had not picked up his Repatha this was given to him at this visit also Zetia and Crestor will be continued

## 2021-12-26 NOTE — Progress Notes (Signed)
New Patient Office Visit  Subjective:  Patient ID: Travis Benson, male    DOB: 02/14/1961  Age: 60 y.o. MRN: [id number]  CC:  Chief Complaint  Patient presents with   Hypertension    HPI [name] presents for primary care to establish.  This patient was seen by our physician assistant Brandon in September for initial visit at that visit the patient received a flu vaccine and was referred to physical therapy.  He has had previous above-knee amputation in 2020 and needed further physical therapy and needed to accommodate to his prosthesis.  Note on arrival blood pressure is elevated and he has been off all blood pressure medications.  He also has coronary disease followed closely by cardiology and needs to be on triple therapy because of his familial hypercholesterolemia.  He is only been on the Zetia recently.  He states his blood pressures at home been systolic in the [id number] range but here is elevated.  Patient does not have any other real complaints at this time.  He does complain of some chronic pain in the left shoulder and the right leg and left lower extremity.  Patient does have a history of chronic hepatitis C which is quiescent at this time.  The patient does need colon cancer screening. The patient agreed to and did receive the pneumonia vaccine Prevnar 20 at this visit Past Medical History:  Diagnosis Date   Arthritis    neck   Complication of anesthesia    "crazy dreams"   Coronary artery disease    Head injury with loss of consciousness ([place])    History of hepatitis C    Hypertension    Injury of nerve of right lower leg 03/02/2019   Left scapula fracture 03/02/2019   Open left tibial fracture 03/02/2019   Pelvic ring fracture ([place]), Right  03/02/2019   Right knee dislocation 03/02/2019   Unspecified injury of popliteal artery, right leg, initial encounter 03/02/2019    Past Surgical History:  Procedure Laterality Date   AMPUTATION Right 04/13/2019   Procedure:  AMPUTATION ABOVE KNEE;  Surgeon: Altamese Schuyler, MD;  Location: [place];  Service: Orthopedics;  Laterality: Right;   APPLICATION OF WOUND VAC Bilateral 03/05/2019   Procedure: WOUND VAC CHANGE RIGHT LOWER LEG; REMOVAL OF WOUND VAC LEFT LOWER LEG WITH APPLICATION OF DRESSINGS;  Surgeon: Rosetta Posner, MD;  Location: [place];  Service: Vascular;  Laterality: Bilateral;   APPLICATION OF WOUND VAC Right 04/09/2019   Procedure: APPLICATION OF WOUND VAC;  Surgeon: Altamese [place], MD;  Location: [place];  Service: Orthopedics;  Laterality: Right;   APPLICATION OF WOUND VAC Bilateral 03/02/2019   Procedure: Application Of Wound Vac;  Surgeon: Altamese [place]enburg, MD;  Location: [place];  Service: Orthopedics;  Laterality: Bilateral;   APPLICATION OF WOUND VAC Right 03/02/2019   Procedure: Wound Vac dressing removal;  Surgeon: Waynetta Sandy, MD;  Location: [place];  Service: Vascular;  Laterality: Right;   EXTERNAL FIXATION LEG Right 03/02/2019   Procedure: External Fixation Leg;  Surgeon: Altamese Thornton, MD;  Location: [place];  Service: Orthopedics;  Laterality: Right;   EXTERNAL FIXATION REMOVAL Left 03/02/2019   Procedure: REMOVAL EXTERNAL FIXATION LEG;  Surgeon: Altamese [place], MD;  Location: [place];  Service: Orthopedics;  Laterality: Left;   FASCIOTOMY Right 02/28/2019   Procedure: FOUR COMPARTMENT FASCIOTOMY OF RIGHT LOWER LEG;  Surgeon: Waynetta Sandy, MD;  Location: [place];  Service: Vascular;  Laterality: Right;   FEMORAL-POPLITEAL  BYPASS GRAFT Right 02/28/2019   Procedure: BYPASS GRAFT OF ABOVE KNEE POPLITEAL- BELOW KNEE POPLITEAL ARTERY;  Surgeon: Waynetta Sandy, MD;  Location: [place];  Service: Vascular;  Laterality: Right;   FEMUR IM NAIL Left 03/02/2019   Procedure: INTRAMEDULLARY (IM) RETROGRADE FEMORAL NAILING;  Surgeon: Altamese [place], MD;  Location: [place];  Service: Orthopedics;  Laterality: Left;   HARDWARE REMOVAL Left 11/19/2019   Procedure: HARDWARE REMOVAL LEFT TIBIA;  Surgeon:  Altamese Rutherford, MD;  Location: [place];  Service: Orthopedics;  Laterality: Left;   I & D EXTREMITY Left 02/28/2019   Procedure: IRRIGATION AND DEBRIDEMENT OF LEFT LOWER LEG /INTERNAL FIXATION OF LEFT TIBIA PLACEMENT OF FRACTURE PINLEFT FEMUR/ CLOSED REDUCTION OF LEFT FEMUR.;  Surgeon: Paralee Cancel, MD;  Location: [place];  Service: Orthopedics;  Laterality: Left;   I & D EXTREMITY Right 04/09/2019   Procedure: IRRIGATION AND DEBRIDEMENT EXTREMITY R lower leg;  Surgeon: Altamese [place], MD;  Location: [place];  Service: Orthopedics;  Laterality: Right;   I & D EXTREMITY Right 04/13/2019   Procedure: IRRIGATION AND DEBRIDEMENT EXTREMITY Right Leg;  Surgeon: Altamese Ladson, MD;  Location: [place];  Service: Orthopedics;  Laterality: Right;   I & D EXTREMITY Left 03/02/2019   Procedure: IRRIGATION AND DEBRIDEMENT LEG;  Surgeon: Altamese Morehouse, MD;  Location: [place];  Service: Orthopedics;  Laterality: Left;   ORIF PELVIC FRACTURE WITH PERCUTANEOUS SCREWS Right 03/02/2019   Procedure: Orif Pelvic Fracture With Percutaneous Screws;  Surgeon: Altamese [place]side, MD;  Location: [place];  Service: Orthopedics;  Laterality: Right;   SKIN SPLIT GRAFT Right 03/26/2019   Procedure: SKIN GRAFT SPLIT THICKNESS;  Surgeon: Altamese [place], MD;  Location: [place];  Service: Orthopedics;  Laterality: Right;   TIBIA IM NAIL INSERTION Left 03/02/2019   Procedure: INTRAMEDULLARY (IM) NAIL TIBIAL;  Surgeon: Altamese [place], MD;  Location: [place];  Service: Orthopedics;  Laterality: Left;    Family History  Problem Relation Age of Onset   Kidney failure Mother    Diabetes Father     Social History   Socioeconomic History   Marital status: Married    Spouse name: Not on file   Number of children: Not on file   Years of education: Not on file   Highest education level: Not on file  Occupational History   Not on file  Tobacco Use   Smoking status: Never   Smokeless tobacco: Never  Vaping Use   Vaping Use: Never used  Substance and  Sexual Activity   Alcohol use: Never   Drug use: Never   Sexual activity: Not Currently    Birth control/protection: None  Other Topics Concern   Not on file  Social History Narrative   ** Merged History Encounter **       Social Determinants of Health   Financial Resource Strain: Not on file  Food Insecurity: Not on file  Transportation Needs: Not on file  Physical Activity: Not on file  Stress: Not on file  Social Connections: Not on file  Intimate Partner Violence: Not on file    Outpatient Medications Prior to Visit  Medication Sig Dispense Refill   ibuprofen (ADVIL) 200 MG tablet Take 400 mg by mouth every 6 (six) hours as needed for moderate pain.     Misc. Devices MISC Single [place] crutches.  Diagnosis unstable gait. 1 each 0   nystatin cream (MYCOSTATIN) Apply 1 application topically 2 (two) times daily. X 10 days as needed for itchy rash  then as needed 30 g 3   ezetimibe (ZETIA) 10 MG tablet TAKE 1 TABLET (10 MG TOTAL) BY MOUTH DAILY. 90 tablet 1   aspirin EC 81 MG EC tablet Take 1 tablet (81 mg total) by mouth daily. (Patient not taking: Reported on 10/10/2021)     Evolocumab (REPATHA) 140 MG/ML SOSY Inject 140 mg into the skin every 14 (fourteen) days. (Patient not taking: Reported on 12/26/2021) 2 mL 1   gabapentin (NEURONTIN) 300 MG capsule TAKE 1 TABLET BY MOUTH TWICE DAILY, AND 2 TABS AT BEDTIME (Patient taking differently: TAKES PRN) 120 capsule 5   lisinopril (ZESTRIL) 10 MG tablet TAKE 1 TABLET (10 MG TOTAL) BY MOUTH DAILY. (Patient not taking: Reported on 12/26/2021) 90 tablet 3   polyethylene glycol powder (GLYCOLAX/MIRALAX) 17 GM/SCOOP powder Take by mouth daily as needed for mild constipation. (Patient not taking: Reported on 12/26/2021)     rosuvastatin (CRESTOR) 20 MG tablet Take 1 tablet (20 mg total) by mouth daily. (Patient not taking: Reported on 10/10/2021) 90 tablet 3   No facility-administered medications prior to visit.    Allergies   Allergen Reactions   Contrast Media [Iodinated Contrast Media] Hives     States took Benedryl for couple days    ROS Review of Systems  Constitutional:  Negative for chills, diaphoresis and fever.  HENT:  Negative for congestion, hearing loss, nosebleeds, sore throat and tinnitus.   Eyes:  Negative for photophobia and redness.  Respiratory:  Negative for cough, shortness of breath, wheezing and stridor.   Cardiovascular:  Negative for chest pain, palpitations and leg swelling.  Gastrointestinal:  Negative for abdominal pain, blood in stool, constipation, diarrhea, nausea and vomiting.  Endocrine: Negative for polydipsia.  Genitourinary:  Negative for dysuria, flank pain, frequency, hematuria and urgency.  Musculoskeletal:  Positive for back pain and gait problem. Negative for myalgias and neck pain.  Skin:  Negative for rash.  Allergic/Immunologic: Negative for environmental allergies.  Neurological:  Negative for dizziness, tremors, seizures, weakness and headaches.  Hematological:  Does not bruise/bleed easily.  Psychiatric/Behavioral: Negative.  Negative for suicidal ideas. The patient is not nervous/anxious.      Objective:    Physical Exam Vitals reviewed.  Constitutional:      Appearance: Normal appearance. He is well-developed. He is not diaphoretic.  HENT:     Head: Normocephalic and atraumatic.     Nose: No nasal deformity, septal deviation, mucosal edema or rhinorrhea.     Right Sinus: No maxillary sinus tenderness or frontal sinus tenderness.     Left Sinus: No maxillary sinus tenderness or frontal sinus tenderness.     Mouth/Throat:     Pharynx: No oropharyngeal exudate.     Comments: Poor dentition Eyes:     General: No scleral icterus.    Conjunctiva/sclera: Conjunctivae normal.     Pupils: Pupils are equal, round, and reactive to light.  Neck:     Thyroid: No thyromegaly.     Vascular: No carotid bruit or JVD.     Trachea: Trachea normal. No tracheal  tenderness or tracheal deviation.  Cardiovascular:     Rate and Rhythm: Normal rate and regular rhythm.     Chest Wall: PMI is not displaced.     Pulses: Normal pulses. No decreased pulses.     Heart sounds: Normal heart sounds, S1 normal and S2 normal. Heart sounds not distant. No murmur heard. No systolic murmur is present.  No diastolic murmur is present.    No friction  rub. No gallop. No S3 or S4 sounds.  Pulmonary:     Effort: Pulmonary effort is normal. No tachypnea, accessory muscle usage or respiratory distress.     Breath sounds: Normal breath sounds. No stridor. No decreased breath sounds, wheezing, rhonchi or rales.  Chest:     Chest wall: No tenderness.  Abdominal:     General: Bowel sounds are normal. There is no distension.     Palpations: Abdomen is soft. Abdomen is not rigid.     Tenderness: There is no abdominal tenderness. There is no guarding or rebound.  Musculoskeletal:        General: Normal range of motion.     Cervical back: Normal range of motion and neck supple. No edema, erythema or rigidity. No muscular tenderness. Normal range of motion.     Comments: Right above-knee amputation in place and prosthesis working  Lymphadenopathy:     Head:     Right side of head: No submental or submandibular adenopathy.     Left side of head: No submental or submandibular adenopathy.     Cervical: No cervical adenopathy.  Skin:    General: Skin is warm and dry.     Coloration: Skin is not pale.     Findings: No rash.     Nails: There is no clubbing.  Neurological:     General: No focal deficit present.     Mental Status: He is alert and oriented to person, place, and time. Mental status is at baseline.     Sensory: No sensory deficit.  Psychiatric:        Mood and Affect: Mood normal.        Speech: Speech normal.        Behavior: Behavior normal.        Thought Content: Thought content normal.        Judgment: Judgment normal.    BP (!) 153/87    Pulse 79    Wt  247 lb 3.2 oz (112.1 kg)    SpO2 95%    BMI 33.53 kg/m  Wt Readings from Last 3 Encounters:  12/26/21 247 lb 3.2 oz (112.1 kg)  10/10/21 240 lb (108.9 kg)  02/20/21 247 lb (112 kg)     Health Maintenance Due  Topic Date Due   Zoster Vaccines- Shingrix (1 of 2) Never done   Fecal DNA (Cologuard)  Never done    There are no preventive care reminders to display for this patient.  No results found for: TSH Lab Results  Component Value Date   WBC 5.5 09/26/2021   HGB 15.1 09/26/2021   HCT 45.0 09/26/2021   MCV 88 09/26/2021   PLT 484 (H) 09/26/2021   Lab Results  Component Value Date   NA 140 09/26/2021   K 4.5 09/26/2021   CO2 20 09/26/2021   GLUCOSE 92 09/26/2021   BUN 13 09/26/2021   CREATININE 1.30 (H) 09/26/2021   BILITOT 0.2 09/26/2021   ALKPHOS 167 (H) 09/26/2021   AST 21 09/26/2021   ALT 13 09/26/2021   PROT 8.0 09/26/2021   ALBUMIN 4.5 09/26/2021   CALCIUM 9.6 09/26/2021   ANIONGAP 13 11/19/2019   EGFR 63 09/26/2021   Lab Results  Component Value Date   CHOL 318 (H) 09/26/2021   Lab Results  Component Value Date   HDL 44 09/26/2021   Lab Results  Component Value Date   LDLCALC 230 (H) 09/26/2021   Lab Results  Component Value Date  TRIG 220 (H) 09/26/2021   Lab Results  Component Value Date   CHOLHDL 7.2 (H) 09/26/2021   No results found for: HGBA1C    Assessment & Plan:   Problem List Items Addressed This Visit       Cardiovascular and Mediastinum   Hypertension    Hypertension not being controlled due to lack of access and use of medication  Plan to begin valsartan 40 mg daily  Discontinue lisinopril  Patient given diet education  Needs short-term follow-up for blood pressure management      Relevant Medications   valsartan (DIOVAN) 80 MG tablet   Evolocumab (REPATHA) 140 MG/ML SOSY   ezetimibe (ZETIA) 10 MG tablet   rosuvastatin (CRESTOR) 20 MG tablet   Coronary artery disease    Needs intense therapy for familiar  hypercholesterolemia  Patient had not picked up his Repatha this was given to him at this visit also Zetia and Crestor will be continued      Relevant Medications   valsartan (DIOVAN) 80 MG tablet   Evolocumab (REPATHA) 140 MG/ML SOSY   ezetimibe (ZETIA) 10 MG tablet   rosuvastatin (CRESTOR) 20 MG tablet   PAD (peripheral artery disease) ([place])    Patient with peripheral artery disease as well as coronary artery disease again intensive therapy for cholesterol elevation was given      Relevant Medications   valsartan (DIOVAN) 80 MG tablet   Evolocumab (REPATHA) 140 MG/ML SOSY   ezetimibe (ZETIA) 10 MG tablet   rosuvastatin (CRESTOR) 20 MG tablet     Digestive   History of hepatitis C    Currently quiescent after therapy        Musculoskeletal and Integument   Left scapula fracture    Prior history of scapular fracture on the left he does have a torn left rotator cuff        Other   Familial hypercholesterolemia    Management as per cardiology current therapy not adherent due to lack of access medications reordered      Relevant Medications   valsartan (DIOVAN) 80 MG tablet   Evolocumab (REPATHA) 140 MG/ML SOSY   ezetimibe (ZETIA) 10 MG tablet   rosuvastatin (CRESTOR) 20 MG tablet   Other Visit Diagnoses     Need for Streptococcus pneumoniae vaccination    -  Primary   Relevant Orders   Pneumococcal conjugate vaccine 20-valent (Completed)   Colon cancer screening       Relevant Orders   Cologuard       Meds ordered this encounter  Medications   valsartan (DIOVAN) 80 MG tablet    Sig: Take 1 tablet (80 mg total) by mouth daily.    Dispense:  90 tablet    Refill:  3   Evolocumab (REPATHA) 140 MG/ML SOSY    Sig: Inject 140 mg into the skin every 14 (fourteen) days.    Dispense:  2 mL    Refill:  1    Never picked up reordred   ezetimibe (ZETIA) 10 MG tablet    Sig: TAKE 1 TABLET (10 MG TOTAL) BY MOUTH DAILY.    Dispense:  90 tablet    Refill:  1    DISCONTD: gabapentin (NEURONTIN) 300 MG capsule    Sig: TAKES PRN    Dispense:  120 capsule    Refill:  5   rosuvastatin (CRESTOR) 20 MG tablet    Sig: Take 1 tablet (20 mg total) by mouth daily.    Dispense:  90  tablet    Refill:  3   Blood Pressure Monitoring (BLOOD PRESSURE KIT) DEVI    Sig: Use to measure blood pressure    Dispense:  1 each    Refill:  0   gabapentin (NEURONTIN) 300 MG capsule    Sig: Take 1 capsule (300 mg total) by mouth 3 (three) times daily as needed. TAKE as needed    Dispense:  120 capsule    Refill:  5    Follow-up: Return in about 2 months (around 02/26/2022).    Asencion Noble, MD

## 2021-12-26 NOTE — Assessment & Plan Note (Signed)
Currently quiescent after therapy

## 2021-12-26 NOTE — Assessment & Plan Note (Signed)
Patient with peripheral artery disease as well as coronary artery disease again intensive therapy for cholesterol elevation was given

## 2021-12-26 NOTE — Telephone Encounter (Signed)
Called susan the clinical pharmacist from [facility] left a vm with my personal desk number

## 2021-12-26 NOTE — Assessment & Plan Note (Signed)
Prior history of scapular fracture on the left he does have a torn left rotator cuff

## 2021-12-26 NOTE — Assessment & Plan Note (Signed)
Management as per cardiology current therapy not adherent due to lack of access medications reordered

## 2021-12-27 ENCOUNTER — Telehealth: Payer: Self-pay

## 2021-12-27 ENCOUNTER — Ambulatory Visit: Payer: Medicaid Other

## 2021-12-27 ENCOUNTER — Other Ambulatory Visit: Payer: Self-pay

## 2021-12-27 DIAGNOSIS — R2681 Unsteadiness on feet: Secondary | ICD-10-CM

## 2021-12-27 DIAGNOSIS — R2689 Other abnormalities of gait and mobility: Secondary | ICD-10-CM

## 2021-12-27 NOTE — Telephone Encounter (Signed)
Have not seen paperwork, pls place on my desk when arrives

## 2021-12-27 NOTE — Telephone Encounter (Signed)
Regarding Repatha PA:  I left a message for Manuela Schwartz, the PA clinician([phone]) to get more information as to what are the exact criteria that needs to be met for the PA.  I asked for her to return my call tomorrow between 9am and 2pm.  Per patients help desk, Jamelle Haring, a 'baseline LDL and after treatment LDL' needs to be included in the PA.  I had included patient's lipid panel and last 2 office visit notes from Dr. Joya Gaskins and Dr. Bettina Gavia on the original PA.
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
## 2021-12-28 NOTE — Telephone Encounter (Signed)
Thank you so much Kelly

## 2022-01-01 ENCOUNTER — Ambulatory Visit: Payer: Medicaid Other | Admitting: Rehabilitation

## 2022-01-01 NOTE — Telephone Encounter (Signed)
I called Travis Benson again about fax but no answer and I have not seen paperwork!

## 2022-01-02 ENCOUNTER — Other Ambulatory Visit: Payer: Self-pay

## 2022-01-03 ENCOUNTER — Ambulatory Visit: Payer: Medicaid Other | Attending: Physician Assistant | Admitting: Rehabilitation

## 2022-01-03 ENCOUNTER — Encounter: Payer: Self-pay | Admitting: Rehabilitation

## 2022-01-03 ENCOUNTER — Other Ambulatory Visit: Payer: Self-pay

## 2022-01-03 DIAGNOSIS — R2689 Other abnormalities of gait and mobility: Secondary | ICD-10-CM | POA: Insufficient documentation

## 2022-01-03 DIAGNOSIS — M6281 Muscle weakness (generalized): Secondary | ICD-10-CM | POA: Diagnosis present

## 2022-01-03 DIAGNOSIS — R2681 Unsteadiness on feet: Secondary | ICD-10-CM | POA: Insufficient documentation

## 2022-01-03 DIAGNOSIS — R293 Abnormal posture: Secondary | ICD-10-CM | POA: Diagnosis present
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
## 2022-01-08 ENCOUNTER — Ambulatory Visit: Payer: Medicaid Other | Admitting: Rehabilitation

## 2022-01-08 DIAGNOSIS — Z1211 Encounter for screening for malignant neoplasm of colon: Secondary | ICD-10-CM | POA: Diagnosis not present

## 2022-01-10 ENCOUNTER — Ambulatory Visit: Payer: Medicaid Other | Admitting: Rehabilitation

## 2022-01-15 ENCOUNTER — Ambulatory Visit: Payer: Medicaid Other | Admitting: Rehabilitation

## 2022-01-15 ENCOUNTER — Other Ambulatory Visit: Payer: Self-pay

## 2022-01-15 ENCOUNTER — Encounter: Payer: Self-pay | Admitting: Rehabilitation

## 2022-01-15 DIAGNOSIS — R2689 Other abnormalities of gait and mobility: Secondary | ICD-10-CM

## 2022-01-15 DIAGNOSIS — M6281 Muscle weakness (generalized): Secondary | ICD-10-CM

## 2022-01-15 DIAGNOSIS — R293 Abnormal posture: Secondary | ICD-10-CM

## 2022-01-15 DIAGNOSIS — R2681 Unsteadiness on feet: Secondary | ICD-10-CM
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
## 2022-01-16 LAB — COLOGUARD: COLOGUARD: NEGATIVE

## 2022-01-17 ENCOUNTER — Ambulatory Visit: Payer: Medicaid Other | Admitting: Rehabilitation

## 2022-01-17 ENCOUNTER — Encounter: Payer: Self-pay | Admitting: Rehabilitation

## 2022-01-17 ENCOUNTER — Other Ambulatory Visit: Payer: Self-pay

## 2022-01-17 DIAGNOSIS — R2681 Unsteadiness on feet: Secondary | ICD-10-CM

## 2022-01-17 DIAGNOSIS — R2689 Other abnormalities of gait and mobility: Secondary | ICD-10-CM

## 2022-01-17 DIAGNOSIS — R293 Abnormal posture: Secondary | ICD-10-CM

## 2022-01-17 DIAGNOSIS — M6281 Muscle weakness (generalized): Secondary | ICD-10-CM
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
## 2022-01-22 ENCOUNTER — Encounter: Payer: Self-pay | Admitting: Rehabilitation

## 2022-01-22 ENCOUNTER — Other Ambulatory Visit: Payer: Self-pay

## 2022-01-22 ENCOUNTER — Ambulatory Visit: Payer: Medicaid Other | Admitting: Rehabilitation

## 2022-01-22 DIAGNOSIS — R2681 Unsteadiness on feet: Secondary | ICD-10-CM

## 2022-01-22 DIAGNOSIS — M6281 Muscle weakness (generalized): Secondary | ICD-10-CM

## 2022-01-22 DIAGNOSIS — R2689 Other abnormalities of gait and mobility: Secondary | ICD-10-CM | POA: Diagnosis not present

## 2022-01-22 DIAGNOSIS — R293 Abnormal posture: Secondary | ICD-10-CM
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
## 2022-01-24 ENCOUNTER — Ambulatory Visit: Payer: Medicaid Other | Admitting: Rehabilitation

## 2022-01-29 ENCOUNTER — Ambulatory Visit: Payer: Medicaid Other

## 2022-01-29 ENCOUNTER — Other Ambulatory Visit: Payer: Self-pay

## 2022-01-29 DIAGNOSIS — R2681 Unsteadiness on feet: Secondary | ICD-10-CM

## 2022-01-29 DIAGNOSIS — R2689 Other abnormalities of gait and mobility: Secondary | ICD-10-CM | POA: Diagnosis not present

## 2022-01-29 DIAGNOSIS — M6281 Muscle weakness (generalized): Secondary | ICD-10-CM
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
## 2022-03-27 DIAGNOSIS — S332XXD Dislocation of sacroiliac and sacrococcygeal joint, subsequent encounter: Secondary | ICD-10-CM | POA: Diagnosis not present

## 2022-03-27 DIAGNOSIS — S72302D Unspecified fracture of shaft of left femur, subsequent encounter for closed fracture with routine healing: Secondary | ICD-10-CM | POA: Diagnosis not present

## 2022-06-04 ENCOUNTER — Other Ambulatory Visit: Payer: Self-pay

## 2022-06-04 ENCOUNTER — Ambulatory Visit: Payer: Self-pay | Admitting: *Deleted

## 2022-06-04 ENCOUNTER — Telehealth: Payer: Medicaid Other | Admitting: Physician Assistant

## 2022-06-04 DIAGNOSIS — H1031 Unspecified acute conjunctivitis, right eye: Secondary | ICD-10-CM | POA: Diagnosis not present

## 2022-06-04 MED ORDER — POLYMYXIN B-TRIMETHOPRIM 10000-0.1 UNIT/ML-% OP SOLN
OPHTHALMIC | 0 refills | Status: DC
  Filled 2022-06-04: qty 10, 5d supply, fill #0

## 2022-06-04 NOTE — Telephone Encounter (Signed)
Summary: pink eye   Pt has contracted pink eye in the right eye and pt asked if Dr. Delford Field can call in something for it / it draining clear / please advise      Reason for Disposition  [1] Eye with yellow or green discharge, or eyelashes stick together AND [2] NO PCP standing order to call in antibiotic eye drops  (Exception: [place]; continue triage.)  Answer Assessment - Initial Assessment Questions 1. LOCATION: Location: "What's red, the eyeball or the outer eyelids?" (Note: when callers say the eye is red, they usually mean the sclera is red)       Sclera and eyelid 2. REDNESS OF SCLERA: "Is the redness in one or both eyes?" "When did the redness start?"      Right eye 3. ONSET: "When did the eye become red?" (e.g., hours, days)      Sunday 4. EYELIDS: "Are the eyelids red or swollen?" If Yes, ask: "How much?"      Red, slightly swollen 5. VISION: "Is there any difficulty seeing clearly?"      Constant watering 6. ITCHING: "Does it feel itchy?" If so ask: "How bad is it" (e.g., Scale 1-10; or mild, moderate, severe)     Moderate itching 7. PAIN: "Is there any pain? If Yes, ask: "How bad is it?" (e.g., Scale 1-10; or mild, moderate, severe)     no 8. CONTACT LENS: "Do you wear contacts?"     no 9. CAUSE: "What do you think is causing the redness?"     Pink eye-infection 10. OTHER SYMPTOMS: "Do you have any other symptoms?" (e.g., fever, runny nose, cough, vomiting)       Runny nose, sneezing  Protocols used: Eye - Red Without Pus-A-AH, Eye - Pus or Discharge-A-AH

## 2022-06-04 NOTE — Progress Notes (Signed)
Virtual Visit Consent   Travis Benson, you are scheduled for a virtual visit with a [facility] provider today. Just as with appointments in the office, your consent must be obtained to participate. Your consent will be active for this visit and any virtual visit you may have with one of our providers in the next 365 days. If you have a MyChart account, a copy of this consent can be sent to you electronically.  As this is a virtual visit, video technology does not allow for your provider to perform a traditional examination. This may limit your provider's ability to fully assess your condition. If your provider identifies any concerns that need to be evaluated in person or the need to arrange testing (such as labs, EKG, etc.), we will make arrangements to do so. Although advances in technology are sophisticated, we cannot ensure that it will always work on either your end or our end. If the connection with a video visit is poor, the visit may have to be switched to a telephone visit. With either a video or telephone visit, we are not always able to ensure that we have a secure connection.  By engaging in this virtual visit, you consent to the provision of healthcare and authorize for your insurance to be billed (if applicable) for the services provided during this visit. Depending on your insurance coverage, you may receive a charge related to this service.  I need to obtain your verbal consent now. Are you willing to proceed with your visit today? Travis Benson has provided verbal consent on 06/04/2022 for a virtual visit (video or telephone). Piedad Climes, [place]  Date: 06/04/2022 3:16 PM  Virtual Visit via Video Note   I, Piedad Climes, connected with  Travis Benson  ([id number], [birth year]) on 06/04/22 at  3:15 PM EDT by a video-enabled telemedicine application and verified that I am speaking with the correct person using two identifiers.  Location: Patient: Virtual Visit Location  Patient: Home Provider: Virtual Visit Location Provider: Home Office   I discussed the limitations of evaluation and management by telemedicine and the availability of in person appointments. The patient expressed understanding and agreed to proceed.    History of Present Illness: Travis Benson is a 61 y.o. who identifies as a male who was assigned male at birth, and is being seen today for possible pink eye. Notes he was recently around his granddaughter but she has not had any issue with illness. Denies any recent travel or sick contact. . Notes symptoms starting about 2 days ago during the day. Since then notes continued redness of his R eye, now with drainage and mild irritation.   HPI: HPI  Problems:  Patient Active Problem List   Diagnosis Date Noted   PAD (peripheral artery disease) (HCC) 12/26/2021   Familial hypercholesterolemia 12/26/2021   Hypertension    History of hepatitis C    Coronary artery disease    Arthritis    Left scapula fracture 03/02/2019    Allergies:  Allergies  Allergen Reactions   Contrast Media [Iodinated Contrast Media] Hives     States took Benedryl for couple days   Medications:  Current Outpatient Medications:    trimethoprim-polymyxin b (POLYTRIM) ophthalmic solution, Apply 1-2 drops into affected eye four times a day x 5 days, Disp: 10 mL, Rfl: 0   aspirin EC 81 MG EC tablet, Take 1 tablet (81 mg total) by mouth daily. (Patient not taking: Reported on 10/10/2021), Disp: , Rfl:  Evolocumab (REPATHA) 140 MG/ML SOSY, Inject 140 mg into the skin every 14 (fourteen) days., Disp: 2 mL, Rfl: 1   ezetimibe (ZETIA) 10 MG tablet, TAKE 1 TABLET (10 MG TOTAL) BY MOUTH DAILY., Disp: 90 tablet, Rfl: 1   gabapentin (NEURONTIN) 300 MG capsule, Take 1 capsule (300 mg total) by mouth 3 (three) times daily as needed. TAKE as needed, Disp: 120 capsule, Rfl: 5   ibuprofen (ADVIL) 200 MG tablet, Take 400 mg by mouth every 6 (six) hours as needed for moderate pain.,  Disp: , Rfl:    Misc. Devices MISC, Single Loftstrand crutches.  Diagnosis unstable gait., Disp: 1 each, Rfl: 0   rosuvastatin (CRESTOR) 20 MG tablet, Take 1 tablet (20 mg total) by mouth daily., Disp: 90 tablet, Rfl: 3   valsartan (DIOVAN) 80 MG tablet, Take 1 tablet (80 mg total) by mouth daily., Disp: 90 tablet, Rfl: 3  Observations/Objective: Patient is well-developed, well-nourished in no acute distress.  Resting comfortably at home.  Head is normocephalic, atraumatic.  No labored breathing. Speech is clear and coherent with logical content.  Patient is alert and oriented at baseline.  Right conjunctival injection noted with some drainage. Left conjunctiva without eye redness. EOMI.   Assessment and Plan: 1. Acute bacterial conjunctivitis of right eye - trimethoprim-polymyxin b (POLYTRIM) ophthalmic solution; Apply 1-2 drops into affected eye four times a day x 5 days  Dispense: 10 mL; Refill: 0  No alarm signs or symptoms present. Supportive measures and OTC medications reviewed. Polytrim per orders.   Follow Up Instructions: I discussed the assessment and treatment plan with the patient. The patient was provided an opportunity to ask questions and all were answered. The patient agreed with the plan and demonstrated an understanding of the instructions.  A copy of instructions were sent to the patient via MyChart unless otherwise noted below.   The patient was advised to call back or seek an in-person evaluation if the symptoms worsen or if the condition fails to improve as anticipated.  Time:  I spent 10 minutes with the patient via telehealth technology discussing the above problems/concerns.    Piedad Climes, PA-C

## 2022-06-04 NOTE — Telephone Encounter (Signed)
Medication sent to pharmacy by PA during virtual apt.

## 2022-06-04 NOTE — Patient Instructions (Signed)
Travis Benson, thank you for joining [name], PA-C for today's virtual visit.  While this provider is not your primary care provider (PCP), if your PCP is located in our provider database this encounter information will be shared with them immediately following your visit.  Consent: (Patient) Travis Benson provided verbal consent for this virtual visit at the beginning of the encounter.  Current Medications:  Current Outpatient Medications:    aspirin EC 81 MG EC tablet, Take 1 tablet (81 mg total) by mouth daily. (Patient not taking: Reported on 10/10/2021), Disp: , Rfl:    Blood Pressure Monitoring (BLOOD PRESSURE KIT) DEVI, Use to measure blood pressure, Disp: 1 each, Rfl: 0   Evolocumab (REPATHA) 140 MG/ML SOSY, Inject 140 mg into the skin every 14 (fourteen) days., Disp: 2 mL, Rfl: 1   ezetimibe (ZETIA) 10 MG tablet, TAKE 1 TABLET (10 MG TOTAL) BY MOUTH DAILY., Disp: 90 tablet, Rfl: 1   gabapentin (NEURONTIN) 300 MG capsule, Take 1 capsule (300 mg total) by mouth 3 (three) times daily as needed. TAKE as needed, Disp: 120 capsule, Rfl: 5   ibuprofen (ADVIL) 200 MG tablet, Take 400 mg by mouth every 6 (six) hours as needed for moderate pain., Disp: , Rfl:    Misc. Devices MISC, Single Loftstrand crutches.  Diagnosis unstable gait., Disp: 1 each, Rfl: 0   nystatin cream (MYCOSTATIN), Apply 1 application topically 2 (two) times daily. X 10 days as needed for itchy rash then as needed, Disp: 30 g, Rfl: 3   rosuvastatin (CRESTOR) 20 MG tablet, Take 1 tablet (20 mg total) by mouth daily., Disp: 90 tablet, Rfl: 3   valsartan (DIOVAN) 80 MG tablet, Take 1 tablet (80 mg total) by mouth daily., Disp: 90 tablet, Rfl: 3   Medications ordered in this encounter:  No orders of the defined types were placed in this encounter. Bacterial Conjunctivitis, Adult Bacterial conjunctivitis is an infection of your conjunctiva. This is the clear membrane that covers the white part of your eye and the  inner part of your eyelid. This infection can make your eye: Red or pink. Itchy or irritated. This condition spreads easily from person to person (is contagious) and from one eye to the other eye. What are the causes? This condition is caused by germs (bacteria). You may get the infection if you come into close contact with: A person who has the infection. Items that have germs on them (are contaminated), such as face towels, contact lens solution, or eye makeup. What increases the risk? You are more likely to get this condition if: You have contact with people who have the infection. You wear contact lenses. You have a sinus infection. You have had a recent eye injury or surgery. You have a weak body defense system (immune system). You have dry eyes. What are the signs or symptoms?  Thick, yellowish discharge from the eye. Tearing or watery eyes. Itchy eyes. Burning feeling in your eyes. Eye redness. Swollen eyelids. Blurred vision. How is this treated?  Antibiotic eye drops or ointment. Antibiotic medicine taken by mouth. This is used for infections that do not get better with drops or ointment or that last more than 10 days. Cool, wet cloths placed on the eyes. Artificial tears used 2-6 times a day. Follow these instructions at home: Medicines Take or apply your antibiotic medicine as told by your doctor. Do not stop using it even if you start to feel better. Take or apply over-the-counter and prescription medicines  only as told by your doctor. Do not touch your eyelid with the eye-drop bottle or the ointment tube. Managing discomfort Wipe any fluid from your eye with a warm, wet washcloth or a cotton ball. Place a clean, cool, wet cloth on your eye. Do this for 10-20 minutes, 3-4 times a day. General instructions Do not wear contacts until the infection is gone. Wear glasses until your doctor says it is okay to wear contacts again. Do not wear eye makeup until the  infection is gone. Throw away old eye makeup. Change or wash your pillowcase every day. Do not share towels or washcloths. Wash your hands often with soap and water for at least 20 seconds and especially before touching your face or eyes. Use paper towels to dry your hands. Do not touch or rub your eyes. Do not drive or use heavy machinery if your vision is blurred. Contact a doctor if: You have a fever. You do not get better after 10 days. Get help right away if: You have a fever and your symptoms get worse all of a sudden. You have very bad pain when you move your eye. Your face: Hurts. Is red. Is swollen. You have sudden loss of vision. Summary Bacterial conjunctivitis is an infection of your conjunctiva. This infection spreads easily from person to person. Wash your hands often with soap and water for at least 20 seconds and especially before touching your face or eyes. Use paper towels to dry your hands. Take or apply your antibiotic medicine as told by your doctor. Contact a doctor if you have a fever or you do not get better after 10 days. This information is not intended to replace advice given to you by your health care provider. Make sure you discuss any questions you have with your health care provider. Document Revised: 03/28/2021 Document Reviewed: 03/28/2021 Elsevier Patient Education  RedanIf you need refills on other medications prior to your next appointment, please contact your pharmacy*  Follow-Up: Call back or seek an in-person evaluation if the symptoms worsen or if the condition fails to improve as anticipated.  Other Instructions Keep hands clean and dry. Avoid touching the eyes. Apply warm compress to the eye for 10 minutes, a few times per day.  Use the Polytrim drops as directed.   If not resolving or any new/worsening symptoms, please seek an in-person evaluation. Do not delay care!     If you have been instructed to have an  in-person evaluation today at a local Urgent Care facility, please use the Travis below. It will take you to a list of all of our available [place] [facility], including address, phone number and hours of operation. Please do not delay care.  [place] [facility]  If you or a family member do not have a primary care provider, use the Travis below to schedule a visit and establish care. When you choose a [facility] primary care physician or advanced practice provider, you gain a long-term partner in health. Find a Primary Care Provider  Learn more about [place]'s in-office and virtual care options: [facility]

## 2022-06-04 NOTE — Telephone Encounter (Signed)
  Chief Complaint: eye red, watery Symptoms: eye red, watery, itching Frequency: stated Sunday Pertinent Negatives: Patient denies fever, pain Disposition: [] ED /[] Urgent Care (no appt availability in office) / [] Appointment(In office/virtual)/ [x]  [facility]/ [] Home Care/ [] Refused Recommended Disposition /[] [facility]/ []  Follow-up with PCP Additional Notes:

## 2022-06-26 ENCOUNTER — Other Ambulatory Visit: Payer: Self-pay | Admitting: Pharmacist

## 2022-06-26 ENCOUNTER — Other Ambulatory Visit: Payer: Self-pay

## 2022-06-26 DIAGNOSIS — I10 Essential (primary) hypertension: Secondary | ICD-10-CM

## 2022-06-26 MED ORDER — BLOOD PRESSURE MONITOR DEVI: 0 refills | Status: AC

## 2022-06-26 MED ORDER — BLOOD PRESSURE MONITOR DEVI
0 refills | Status: DC
  Filled 2022-06-26: qty 1, 90d supply, fill #0

## 2022-06-26 NOTE — Patient Instructions (Signed)
Travis Benson,   It was great talking to you today!  Restart valsartan 80 mg daily for your blood pressure, and rosuvastatin 20 mg, ezetimibe 10 mg, and Repatha 140 mg every 2 weeks for your cholesterol.   Here is a video of how to inject the Repatha. Make sure you let it get to room temperature before you inject- otherwise, it will sting.   [email or web address]  Check your blood pressure once daily, and any time you have concerning symptoms like headache, chest pain, dizziness, shortness of breath, or vision changes.   Our goal is less than 130/80.  To appropriately check your blood pressure, make sure you do the following:  1) Avoid caffeine, exercise, or tobacco products for 30 minutes before checking. Empty your bladder. 2) Sit with your back supported in a flat-backed chair. Rest your arm on something flat (arm of the chair, table, etc). 3) Sit still with your feet flat on the floor, resting, for at least 5 minutes.  4) Check your blood pressure. Take 1-2 readings.  5) Write down these readings and bring with you to any provider appointments.  Bring your home blood pressure machine with you to a provider's office for accuracy comparison at least once a year.   Make sure you take your blood pressure medications before you come to any office visit, even if you were asked to fast for labs.  Take care!  Travis Benson, PharmD, [facility] [phone]

## 2022-06-26 NOTE — Chronic Care Management (AMB) (Signed)
Chief Complaint  Patient presents with   Hypertension    Travis Benson is a 61 y.o. year old male who presented for a telephone visit.   They were referred to the pharmacist by a quality report for assistance in managing hypertension and hyperlipidemia.   Patient is participating in a Managed Medicaid Plan:  Yes  Subjective:  Care Team: Primary Care Provider: Storm Frisk, MD ; Next Scheduled Visit: not scheduled yet Cardiologist: [facility]; Next Scheduled Visit: 08/07/22  Medication Access/Adherence  Current Pharmacy:  [facility] [address], [address] [place] [address] Phone: [phone] Fax: [phone]   Patient reports affordability concerns with their medications: No  Patient reports access/transportation concerns to their pharmacy: No  Patient reports adherence concerns with their medications:  No     Hypertension:  Current medications: valsartan 80 mg - has not been taking consistently.   Patient does not have a validated, automated, upper arm home BP cuff, but is interested in getting one   Hyperlipidemia/ASCVD Risk Reduction  Current lipid lowering medications: rosuvastatin 20 mg daily, ezetimibe 10 mg daily, Repatha 140 mg every 2 weeks - reports he has not been taking these consistently lately  Antiplatelet regimen: aspirin 81 mg daily - not taking   Health Maintenance  Health Maintenance Due  Topic Date Due   COVID-19 Vaccine (1) Never done   Zoster Vaccines- Shingrix (1 of 2) Never done     Objective:  Lab Results  Component Value Date   CREATININE 1.30 (H) 09/26/2021   BUN 13 09/26/2021   NA 140 09/26/2021   K 4.5 09/26/2021   CL 103 09/26/2021   CO2 20 09/26/2021    Lab Results  Component Value Date   CHOL 318 (H) 09/26/2021   HDL 44 09/26/2021   LDLCALC 230 (H) 09/26/2021   TRIG 220 (H) 09/26/2021   CHOLHDL 7.2 (H) 09/26/2021    Medications Reviewed Today      Reviewed by Alden Hipp, RPH-CPP (Pharmacist) on 06/26/22 at 0848  Med List Status: <None>   Medication Order Taking? Sig Documenting Provider Last Dose Status Informant  aspirin EC 81 MG EC tablet [id number]  Take 1 tablet (81 mg total) by mouth daily.  Patient not taking: Reported on 10/10/2021   Jerre Simon, [place]  Active Spouse/Significant Other  Evolocumab (REPATHA) 140 MG/ML SOSY [id number] No Inject 140 mg into the skin every 14 (fourteen) days.  Patient not taking: Reported on 06/26/2022   Storm Frisk, MD Not Taking Active   ezetimibe (ZETIA) 10 MG tablet [id number] Yes TAKE 1 TABLET (10 MG TOTAL) BY MOUTH DAILY. Storm Frisk, MD Taking Active   gabapentin (NEURONTIN) 300 MG capsule [id number] Yes Take 1 capsule (300 mg total) by mouth 3 (three) times daily as needed. TAKE as needed Storm Frisk, MD Taking Active   ibuprofen (ADVIL) 200 MG tablet [id number]  Take 400 mg by mouth every 6 (six) hours as needed for moderate pain. [provider]  Active Spouse/Significant Other  Misc. Devices MISC [id number]  Single Loftstrand crutches.  Diagnosis unstable gait. Hoy Register, MD  Active   rosuvastatin (CRESTOR) 20 MG tablet [id number] Yes Take 1 tablet (20 mg total) by mouth daily. Storm Frisk, MD Taking Active   trimethoprim-polymyxin b [facility]) ophthalmic solution [id number]  Apply 1-2 drops into affected eye four times a day for 5 days. Waldon Merl, PA-C  Active   valsartan (DIOVAN)  80 MG tablet [id number] Yes Take 1 tablet (80 mg total) by mouth daily. Storm Frisk, MD Taking Active               Assessment/Plan:   Hypertension: - Currently uncontrolled - Reviewed long term cardiovascular and renal outcomes of uncontrolled blood pressure - Reviewed appropriate blood pressure monitoring technique and reviewed goal blood pressure. Recommended to check home blood pressure and heart rate daily.  - Script for BP machine sent to [employer].  - Will collaborate with pharmacy to refill valsartan.    Hyperlipidemia/ASCVD Risk Reduction: - Currently uncontrolled.  - Reviewed long term complications of uncontrolled cholesterol - Recommend to restart rosuvastatin, ezetimibe, and start Repatha. Will collaborate with pharmacy   Discussed use of a pill box to organize medications and aid in adherence. Patient will start this.   Follow Up Plan: phone call in 2 weeks  Catie TClearance Coots, PharmD, [facility] [phone]

## 2022-06-27 ENCOUNTER — Telehealth: Payer: Self-pay | Admitting: Pharmacist

## 2022-06-27 ENCOUNTER — Other Ambulatory Visit: Payer: Self-pay | Admitting: Pharmacist

## 2022-06-27 ENCOUNTER — Other Ambulatory Visit: Payer: Self-pay

## 2022-06-27 NOTE — Telephone Encounter (Signed)
Received notification via Cover My Meds that PA for Repatha was denied.    Called [employer] [phone]). They note that they denied the PA as lipid panel labs were not included (of note, they were not asked for on the Cover My Meds website).    They note that most recent lipid panel would need to be faxed. She is faxing a form directly to the office to be completed and faxed back in with the patient's last lipid panel. She advises to mark as a re-review.   Will notify office staff.

## 2022-06-27 NOTE — Chronic Care Management (AMB) (Signed)
Received notification via Cover My Meds that PA for Repatha was denied.    Called [employer] ([phone]). They note that they denied the PA as lipid panel labs were not included (of note, they were not asked for on the Cover My Meds website).    They note that most recent lipid panel would need to be faxed. She is faxing a form directly to the office to be completed and faxed back in with the patient's last lipid panel. She advises to mark as a re-review.   Will notify office staff.  

## 2022-07-01 ENCOUNTER — Other Ambulatory Visit: Payer: Self-pay | Admitting: Critical Care Medicine

## 2022-07-01 DIAGNOSIS — E7801 Familial hypercholesterolemia: Secondary | ICD-10-CM

## 2022-07-01 NOTE — Telephone Encounter (Signed)
Paperwork received and placed in providers box.

## 2022-07-03 ENCOUNTER — Other Ambulatory Visit: Payer: Self-pay

## 2022-07-04 ENCOUNTER — Other Ambulatory Visit: Payer: Self-pay

## 2022-07-08 ENCOUNTER — Other Ambulatory Visit: Payer: Self-pay

## 2022-07-17 ENCOUNTER — Other Ambulatory Visit: Payer: Self-pay | Admitting: Pharmacist

## 2022-07-17 VITALS — BP 153/89 | HR 70

## 2022-07-17 DIAGNOSIS — I10 Essential (primary) hypertension: Secondary | ICD-10-CM

## 2022-07-17 NOTE — Chronic Care Management (AMB) (Signed)
Chief Complaint  Patient presents with   Hypertension    Travis Benson is a 61 y.o. year old male who presented for a telephone visit.   They were referred to the pharmacist by a quality report for assistance in managing hypertension and hyperlipidemia.   Patient is participating in a Managed Medicaid Plan:  Yes  Subjective:  Care Team: Primary Care Provider: Storm Frisk, MD ; Next Scheduled Visit: scheduled today for September Cardiologist: [facility]; Next Scheduled Visit: 08/07/22  Medication Access/Adherence  Current Pharmacy:  [facility] [address], [address] [place] [address] Phone: [phone] Fax: [phone]  [facility] - [place], [place] - [address] [place] [address] Phone: [phone] Fax: [phone]   Patient reports affordability concerns with their medications: No  Patient reports access/transportation concerns to their pharmacy: Yes  Patient reports adherence concerns with their medications:  No     Hypertension:  Current medications: valsartan 80 mg daily - was not filled until last week, patient has been on for ~ 1 week Medications previously tried: amlodipine - lower extremity edema  Patient has a validated, automated, upper arm home BP cuff Current blood pressure readings readings: 150/80s  Patient denies hypotensive s/sx including dizziness, lightheadedness.  Patient denies hypertensive symptoms including headache, chest pain, shortness of breath   Hyperlipidemia/ASCVD Risk Reduction  Current lipid lowering medications: rosuvastatin 20 mg daily, ezetimibe 10 mg daily, prescribed Repatha 140 mg every 14 days but insurance is requiring lipid panel before approval  Antiplatelet regimen: aspirin 81 mg daily  Health Maintenance  Health Maintenance Due  Topic Date Due   COVID-19 Vaccine (1) Never done   Zoster Vaccines-  Shingrix (1 of 2) Never done     Objective: No results found for: "HGBA1C"  Lab Results  Component Value Date   CREATININE 1.30 (H) 09/26/2021   BUN 13 09/26/2021   NA 140 09/26/2021   K 4.5 09/26/2021   CL 103 09/26/2021   CO2 20 09/26/2021    Lab Results  Component Value Date   CHOL 318 (H) 09/26/2021   HDL 44 09/26/2021   LDLCALC 230 (H) 09/26/2021   TRIG 220 (H) 09/26/2021   CHOLHDL 7.2 (H) 09/26/2021    Medications Reviewed Today     Reviewed by Alden Hipp, RPH-CPP (Pharmacist) on 07/17/22 at [phone]  Med List Status: <None>   Medication Order Taking? Sig Documenting Provider Last Dose Status Informant  aspirin EC 81 MG EC tablet [id number] No Take 1 tablet (81 mg total) by mouth daily.  Patient not taking: Reported on 10/10/2021   Jerre Simon, [place] Not Taking Active Spouse/Significant Other  Blood Pressure Monitor DEVI [id number] Yes Use to check blood pressure daily Storm Frisk, MD Taking Active   Evolocumab (REPATHA) 140 MG/ML SOSY [id number] No Inject 140 mg into the skin every 14 (fourteen) days.  Patient not taking: Reported on 06/26/2022   Storm Frisk, MD Not Taking Active   ezetimibe (ZETIA) 10 MG tablet [id number] Yes TAKE 1 TABLET (10 MG TOTAL) BY MOUTH DAILY. Storm Frisk, MD Taking Active   gabapentin (NEURONTIN) 300 MG capsule [id number] No Take 1 capsule (300 mg total) by mouth 3 (three) times daily as needed. TAKE as needed  Patient not taking: Reported on 07/17/2022   Storm Frisk, MD Not Taking Active   Misc. Devices MISC [id number]  Single Loftstrand crutches.  Diagnosis unstable gait.  Hoy Register, MD  Active   rosuvastatin (CRESTOR) 20 MG tablet [id number] Yes Take 1 tablet (20 mg total) by mouth daily. Storm Frisk, MD Taking Active   valsartan (DIOVAN) 80 MG tablet [id number] Yes Take 1 tablet (80 mg total) by mouth daily. Storm Frisk, MD Taking Active               Assessment/Plan:   Hypertension: -  Currently uncontrolled - Encouraged to continue current regimen at this time.  - Reviewed appropriate blood pressure monitoring technique and reviewed goal blood pressure. Recommended to check home blood pressure and heart rate daily - Given prior intolerance to amlodipine at 5 mg daily, recommend addition of diuretic.    Hyperlipidemia/ASCVD Risk Reduction: - Currently uncontrolled.  - Recommend to have updated lipid panel at upcoming cardiology visit to pursue insurance coverage of PCSKi     Follow Up Plan: phone call in 4 weeks  Catie TClearance Coots, PharmD, [facility] [phone]

## 2022-07-17 NOTE — Patient Instructions (Signed)
Mr Baugher,   Keep up the great work!  Talk to Dr. Dulce Sellar about the insurance needing an updated lipid panel for the Repatha injection.   Let's see how your blood pressure is after a bit more time on the valsartan.   Call me with any questions or concerns!  [name], PharmD, [facility] [phone]

## 2022-07-24 ENCOUNTER — Ambulatory Visit: Payer: Medicaid Other | Admitting: Cardiology

## 2022-08-07 ENCOUNTER — Ambulatory Visit: Payer: Medicaid Other | Admitting: Cardiology

## 2022-08-20 ENCOUNTER — Ambulatory Visit: Payer: Self-pay

## 2022-08-20 ENCOUNTER — Telehealth: Payer: Self-pay | Admitting: Pharmacist

## 2022-08-20 NOTE — Telephone Encounter (Signed)
Attempted to call patient for hypertension follow up. Left voicemail for him to return my call at his convenience.   [name], PharmD, [facility] [phone]

## 2022-08-28 NOTE — Telephone Encounter (Signed)
Rescheduled patient for tomorrow.   [name], PharmD, [facility] [phone]

## 2022-08-29 ENCOUNTER — Other Ambulatory Visit: Payer: Medicaid Other | Admitting: Pharmacist

## 2022-08-29 VITALS — BP 135/89

## 2022-08-29 DIAGNOSIS — I739 Peripheral vascular disease, unspecified: Secondary | ICD-10-CM

## 2022-08-29 DIAGNOSIS — I2510 Atherosclerotic heart disease of native coronary artery without angina pectoris: Secondary | ICD-10-CM

## 2022-08-29 DIAGNOSIS — I10 Essential (primary) hypertension: Secondary | ICD-10-CM

## 2022-08-29 NOTE — Patient Instructions (Signed)
[place],   Keep up the great work!  I will let Dr. Delford Field and Dr. Dulce Sellar know how you are doing.   Take care!  [name], PharmD, [facility] [phone]

## 2022-08-29 NOTE — Progress Notes (Signed)
Chief Complaint  Patient presents with   Hypertension   Diabetes    Travis Benson is a 61 y.o. year old male who presented for a telephone visit.   They were referred to the pharmacist by a quality report for assistance in managing diabetes and hypertension.   Patient is participating in a Managed Medicaid Plan:  Yes  Subjective:  Care Team: Primary Care Provider: Storm Frisk, MD ; Next Scheduled Visit: 09/19/22 Cardiologist: Dulce Sellar; Next Scheduled Visit: 09/18/22  Medication Access/Adherence  Current Pharmacy:  [facility] at [facility] [address], [address] [place] [address] Phone: [phone] Fax: [phone]  [facility] - [place], [place] - [address] [place] [address] Phone: [phone] Fax: [phone]   Patient reports affordability concerns with their medications: No  Patient reports access/transportation concerns to their pharmacy: No  Patient reports adherence concerns with their medications:  No     Hypertension:  Current medications: valsartan 80 mg daily  Prior medications: reports a history of HCTZ, had hypotension and a fall; amlodipine 5 mg - reported swelling  Patient has a validated, automated, upper arm home BP cuff Current blood pressure readings readings:   8/29: 143/90; 139/89; 135/90  Patient denies hypotensive s/sx including dizziness, lightheadedness.  Patient denies hypertensive symptoms including headache, chest pain, shortness of breath   Hyperlipidemia/ASCVD Risk Reduction  Current lipid lowering medications: rosuvastatin 20 mg, ezetimibe 10 mg daily   Health Maintenance  Health Maintenance Due  Topic Date Due   COVID-19 Vaccine (1) Never done   Zoster Vaccines- Shingrix (1 of 2) Never done   INFLUENZA VACCINE  07/30/2022     Objective:  Lab Results  Component Value Date   CREATININE 1.30 (H) 09/26/2021   BUN 13  09/26/2021   NA 140 09/26/2021   K 4.5 09/26/2021   CL 103 09/26/2021   CO2 20 09/26/2021    Lab Results  Component Value Date   CHOL 318 (H) 09/26/2021   HDL 44 09/26/2021   LDLCALC 230 (H) 09/26/2021   TRIG 220 (H) 09/26/2021   CHOLHDL 7.2 (H) 09/26/2021    Medications Reviewed Today     Reviewed by Alden Hipp, RPH-CPP (Pharmacist) on 07/17/22 at [phone]  Med List Status: <None>   Medication Order Taking? Sig Documenting Provider Last Dose Status Informant  aspirin EC 81 MG EC tablet [id number] No Take 1 tablet (81 mg total) by mouth daily.  Patient not taking: Reported on 10/10/2021   Jerre Simon, [place] Not Taking Active Spouse/Significant Other  Blood Pressure Monitor DEVI [id number] Yes Use to check blood pressure daily Storm Frisk, MD Taking Active   Evolocumab (REPATHA) 140 MG/ML SOSY [id number] No Inject 140 mg into the skin every 14 (fourteen) days.  Patient not taking: Reported on 06/26/2022   Storm Frisk, MD Not Taking Active   ezetimibe (ZETIA) 10 MG tablet [id number] Yes TAKE 1 TABLET (10 MG TOTAL) BY MOUTH DAILY. Storm Frisk, MD Taking Active   gabapentin (NEURONTIN) 300 MG capsule [id number] No Take 1 capsule (300 mg total) by mouth 3 (three) times daily as needed. TAKE as needed  Patient not taking: Reported on 07/17/2022   Storm Frisk, MD Not Taking Active   Misc. Devices MISC [id number]  Single Loftstrand crutches.  Diagnosis unstable gait. Hoy Register, MD  Active   rosuvastatin (CRESTOR) 20 MG tablet [id number] Yes Take 1 tablet (20 mg total)  by mouth daily. Storm Frisk, MD Taking Active   valsartan (DIOVAN) 80 MG tablet [id number] Yes Take 1 tablet (80 mg total) by mouth daily. Storm Frisk, MD Taking Active               Assessment/Plan:   Hypertension: - Currently uncontrolled - Reviewed long term cardiovascular and renal outcomes of uncontrolled blood pressure - Reviewed appropriate blood pressure monitoring  technique and reviewed goal blood pressure. Recommended to check home blood pressure and heart rate daily  - Recommend to increase valsartan to 160 mg daily. Will message PCP and cardiology to see if they would like to do that before upcoming visits.   Hyperlipidemia/ASCVD Risk Reduction: - Currently uncontrolled.  - Plan for lipid panel per cardiology to pursue PCSK9i coverage. Insurance required an updated lipid panel before they would approve PA for Repatha. - Continue current regimen at this time  Follow Up Plan: phone call in 6 weeks  Catie TClearance Coots, PharmD, [facility] [phone]

## 2022-09-17 NOTE — Progress Notes (Unsigned)
Cardiology Office Note:    Date:  09/18/2022   ID:  Travis Benson, DOB 03/24/1961, MRN [id number]  PCP:  Travis Frisk, MD  Cardiologist:  Travis Herrlich, MD    Referring MD: Travis Frisk, MD    ASSESSMENT:    1. Mild CAD   2. Familial hypercholesterolemia   3. Elevated Lp(a)   4. Essential hypertension    PLAN:    In order of problems listed above:  Stable very concerned about his lipid disorder inadequately treated recheck a lipid profile LP(a) today and refer to the lipid clinic to get access to PCSK9 therapy along with his high intensity statin. Blood pressure not at target optimize treatment increase the dose of his ARB and add thiazide diuretic he has arrangements for follow-up with primary care   Next appointment: 6 months in order to be sure that his lipids have been addressed   Medication Adjustments/Labs and Tests Ordered: Current medicines are reviewed at length with the patient today.  Concerns regarding medicines are outlined above.  No orders of the defined types were placed in this encounter.  No orders of the defined types were placed in this encounter.   Chief complaint I was unable to get Repatha   History of Present Illness:    Travis Benson is a 61 y.o. male with a hx of hypertension hyperlipidemia with elevated LP(a) coronary calcium score on CT of 6.4 and mild CAD on cardiac CTA with mild CAD last seen 10/10/2021.  Compliance with diet, lifestyle and medications: Yes  He missed his last visit with his wife's illness she has severe renal stone disease without nephrectomy and is in the hospital now with ongoing renal infection His benefit manager refused Repatha I Minna refer him to lipid clinic he clearly indicated in his case with familial hyperlipidemia and elevated lipoprotein PLa His home blood pressures are not at goal typically runs 1 greater than 140 and at times greater than 90.  I am going to increase his ARB and add a thiazide  diuretic Chest pain edema shortness of breath palpitation or syncope Past Medical History:  Diagnosis Date   Arthritis    neck   Coronary artery disease    Familial hypercholesterolemia 12/26/2021   History of hepatitis C    Hypertension    Left scapula fracture 03/02/2019   PAD (peripheral artery disease) (HCC) 12/26/2021    Past Surgical History:  Procedure Laterality Date   AMPUTATION Right 04/13/2019   Procedure: AMPUTATION ABOVE KNEE;  Surgeon: Myrene Galas, MD;  Location: [facility] OR;  Service: Orthopedics;  Laterality: Right;   APPLICATION OF WOUND VAC Bilateral 03/05/2019   Procedure: WOUND VAC CHANGE RIGHT LOWER LEG; REMOVAL OF WOUND VAC LEFT LOWER LEG WITH APPLICATION OF DRESSINGS;  Surgeon: Larina Earthly, MD;  Location: [facility] OR;  Service: Vascular;  Laterality: Bilateral;   APPLICATION OF WOUND VAC Right 04/09/2019   Procedure: APPLICATION OF WOUND VAC;  Surgeon: Myrene Galas, MD;  Location: [facility] OR;  Service: Orthopedics;  Laterality: Right;   APPLICATION OF WOUND VAC Bilateral 03/02/2019   Procedure: Application Of Wound Vac;  Surgeon: Myrene Galas, MD;  Location[facility] OR;  Service: Orthopedics;  Laterality: Bilateral;   APPLICATION OF WOUND VAC Right 03/02/2019   Procedure: Wound Vac dressing removal;  Surgeon: Maeola Harman, MD;  Location: [facility] OR;  Service: Vascular;  Laterality: Right;   EXTERNAL FIXATION LEG Right 03/02/2019   Procedure: External Fixation Leg;  Surgeon: Myrene Galas, MD;  Location: [place];  Service: Orthopedics;  Laterality: Right;   EXTERNAL FIXATION REMOVAL Left 03/02/2019   Procedure: REMOVAL EXTERNAL FIXATION LEG;  Surgeon: Altamese Lynden, MD;  Location: [place];  Service: Orthopedics;  Laterality: Left;   FASCIOTOMY Right 02/28/2019   Procedure: FOUR COMPARTMENT FASCIOTOMY OF RIGHT LOWER LEG;  Surgeon: Waynetta Sandy, MD;  Location: [place];  Service: Vascular;  Laterality: Right;   FEMORAL-POPLITEAL BYPASS GRAFT Right 02/28/2019   Procedure:  BYPASS GRAFT OF ABOVE KNEE POPLITEAL- BELOW KNEE POPLITEAL ARTERY;  Surgeon: Waynetta Sandy, MD;  Location: [place];  Service: Vascular;  Laterality: Right;   FEMUR IM NAIL Left 03/02/2019   Procedure: INTRAMEDULLARY (IM) RETROGRADE FEMORAL NAILING;  Surgeon: Altamese [place], MD;  Location: [place];  Service: Orthopedics;  Laterality: Left;   HARDWARE REMOVAL Left 11/19/2019   Procedure: HARDWARE REMOVAL LEFT TIBIA;  Surgeon: Altamese [place], MD;  Location: [place];  Service: Orthopedics;  Laterality: Left;   I & D EXTREMITY Left 02/28/2019   Procedure: IRRIGATION AND DEBRIDEMENT OF LEFT LOWER LEG /INTERNAL FIXATION OF LEFT TIBIA PLACEMENT OF FRACTURE PINLEFT FEMUR/ CLOSED REDUCTION OF LEFT FEMUR.;  Surgeon: Paralee Cancel, MD;  Location: [place];  Service: Orthopedics;  Laterality: Left;   I & D EXTREMITY Right 04/09/2019   Procedure: IRRIGATION AND DEBRIDEMENT EXTREMITY R lower leg;  Surgeon: Altamese [place]Intire, MD;  Location: [place];  Service: Orthopedics;  Laterality: Right;   I & D EXTREMITY Right 04/13/2019   Procedure: IRRIGATION AND DEBRIDEMENT EXTREMITY Right Leg;  Surgeon: Altamese [place], MD;  Location: [place];  Service: Orthopedics;  Laterality: Right;   I & D EXTREMITY Left 03/02/2019   Procedure: IRRIGATION AND DEBRIDEMENT LEG;  Surgeon: Altamese [place], MD;  Location: [place];  Service: Orthopedics;  Laterality: Left;   ORIF PELVIC FRACTURE WITH PERCUTANEOUS SCREWS Right 03/02/2019   Procedure: Orif Pelvic Fracture With Percutaneous Screws;  Surgeon: Altamese [place], MD;  Location: [place];  Service: Orthopedics;  Laterality: Right;   SKIN SPLIT GRAFT Right 03/26/2019   Procedure: SKIN GRAFT SPLIT THICKNESS;  Surgeon: Altamese [place], MD;  Location: [place];  Service: Orthopedics;  Laterality: Right;   TIBIA IM NAIL INSERTION Left 03/02/2019   Procedure: INTRAMEDULLARY (IM) NAIL TIBIAL;  Surgeon: Altamese , MD;  Location: [place];  Service: Orthopedics;  Laterality: Left;    Current Medications: Current  Meds  Medication Sig   aspirin EC 81 MG EC tablet Take 1 tablet (81 mg total) by mouth daily.   Blood Pressure Monitor DEVI Use to check blood pressure daily   ezetimibe (ZETIA) 10 MG tablet TAKE 1 TABLET (10 MG TOTAL) BY MOUTH DAILY.   gabapentin (NEURONTIN) 300 MG capsule Take 1 capsule (300 mg total) by mouth 3 (three) times daily as needed. TAKE as needed (Patient taking differently: Take 300 mg by mouth 3 (three) times daily as needed (Nerve pain).)   Misc. Devices MISC Single Loftstrand crutches.  Diagnosis unstable gait.   rosuvastatin (CRESTOR) 20 MG tablet Take 1 tablet (20 mg total) by mouth daily.   valsartan (DIOVAN) 80 MG tablet Take 1 tablet (80 mg total) by mouth daily.     Allergies:   Contrast media [iodinated contrast media]   Social History   Socioeconomic History   Marital status: Married    Spouse name: Not on file   Number of children: Not on file   Years of education: Not on file   Highest education level: Not on file  Occupational History  Not on file  Tobacco Use   Smoking status: Never   Smokeless tobacco: Never  Vaping Use   Vaping Use: Never used  Substance and Sexual Activity   Alcohol use: Never   Drug use: Never   Sexual activity: Not Currently    Birth control/protection: None  Other Topics Concern   Not on file  Social History Narrative   ** Merged History Encounter **       Social Determinants of Health   Financial Resource Strain: Unknown (04/01/2019)   Overall Financial Resource Strain (CARDIA)    Difficulty of Paying Living Expenses: Patient refused  Food Insecurity: Unknown (04/01/2019)   Hunger Vital Sign    Worried About Running Out of Food in the Last Year: Patient refused    Ran Out of Food in the Last Year: Patient refused  Transportation Needs: Unknown (04/01/2019)   PRAPARE - Transportation    Lack of Transportation (Medical): Patient refused    Lack of Transportation (Non-Medical): Patient refused  Physical Activity: Unknown  (04/01/2019)   Exercise Vital Sign    Days of Exercise per Week: Patient refused    Minutes of Exercise per Session: Patient refused  Stress: Unknown (04/01/2019)   [employer] of Occupational Health - Occupational Stress Questionnaire    Feeling of Stress : Patient refused  Social Connections: Unknown (04/01/2019)   Social Connection and Isolation Panel [NHANES]    Frequency of Communication with Friends and Family: Patient refused    Frequency of Social Gatherings with Friends and Family: Patient refused    Attends Religious Services: Patient refused    Database administrator or Organizations: Patient refused    Attends Banker Meetings: Patient refused    Marital Status: Patient refused     Family History: The patient's family history includes Diabetes in his father; Kidney failure in his mother. ROS:   Please see the history of present illness.    All other systems reviewed and are negative.  EKGs/Labs/Other Studies Reviewed:    The following studies were reviewed today:  EKG:  EKG ordered today and personally reviewed.  The ekg ordered today demonstrates sinus rhythm and normal  Recent Labs: 09/26/2021: ALT 13; BUN 13; Creatinine, Ser 1.30; Hemoglobin 15.1; Platelets 484; Potassium 4.5; Sodium 140  Recent Lipid Panel    Component Value Date/Time   CHOL 318 (H) 09/26/2021 1437   TRIG 220 (H) 09/26/2021 1437   HDL 44 09/26/2021 1437   CHOLHDL 7.2 (H) 09/26/2021 1437   LDLCALC 230 (H) 09/26/2021 1437    Physical Exam:    VS:  BP (!) 146/92   Pulse 68   Ht 6' (1.829 m)   Wt 237 lb (107.5 kg)   SpO2 96%   BMI 32.14 kg/m     Wt Readings from Last 3 Encounters:  09/18/22 237 lb (107.5 kg)  12/26/21 247 lb 3.2 oz (112.1 kg)  10/10/21 240 lb (108.9 kg)     GEN:  Well nourished, well developed in no acute distress HEENT: Normal NECK: No JVD; No carotid bruits LYMPHATICS: No lymphadenopathy CARDIAC: RRR, no murmurs, rubs, gallops RESPIRATORY:  Clear  to auscultation without rales, wheezing or rhonchi  ABDOMEN: Soft, non-tender, non-distended MUSCULOSKELETAL:  No edema; No deformity  SKIN: Warm and dry NEUROLOGIC:  Alert and oriented x 3 PSYCHIATRIC:  Normal affect    Signed, Travis Herrlich, MD  09/18/2022 10:04 AM    [facility]

## 2022-09-18 ENCOUNTER — Encounter: Payer: Self-pay | Admitting: Cardiology

## 2022-09-18 ENCOUNTER — Other Ambulatory Visit: Payer: Self-pay

## 2022-09-18 ENCOUNTER — Ambulatory Visit: Payer: Medicaid Other | Attending: Cardiology | Admitting: Cardiology

## 2022-09-18 VITALS — BP 146/92 | HR 68 | Ht 72.0 in | Wt 237.0 lb

## 2022-09-18 DIAGNOSIS — I10 Essential (primary) hypertension: Secondary | ICD-10-CM

## 2022-09-18 DIAGNOSIS — I2510 Atherosclerotic heart disease of native coronary artery without angina pectoris: Secondary | ICD-10-CM | POA: Diagnosis not present

## 2022-09-18 DIAGNOSIS — E7801 Familial hypercholesterolemia: Secondary | ICD-10-CM

## 2022-09-18 DIAGNOSIS — E7841 Elevated Lipoprotein(a): Secondary | ICD-10-CM | POA: Diagnosis not present

## 2022-09-18 MED ORDER — VALSARTAN-HYDROCHLOROTHIAZIDE 160-12.5 MG PO TABS
1.0000 | ORAL_TABLET | Freq: Every day | ORAL | 5 refills | Status: DC
  Filled 2022-09-18: qty 30, 30d supply, fill #0
  Filled 2022-10-25: qty 30, 30d supply, fill #1
  Filled 2023-02-26: qty 30, 30d supply, fill #2

## 2022-09-18 NOTE — Patient Instructions (Signed)
Medication Instructions:  Your physician has recommended you make the following change in your medication:  Change Diovan 80 to Diovan HCT 160-12.5 mg once daily  *If you need a refill on your cardiac medications before your next appointment, please call your pharmacy*   Lab Work: Your physician recommends that you return for lab work in: Today for a Lipid panel and LPa  If you have labs (blood work) drawn today and your tests are completely normal, you will receive your results only by: MyChart Message (if you have [place]) OR A paper copy in the mail If you have any lab test that is abnormal or we need to change your treatment, we will call you to review the results.   Testing/Procedures: NONE   Follow-Up: At [facility], you and your health needs are our priority.  As part of our continuing mission to provide you with exceptional heart care, we have created designated Provider Care Teams.  These Care Teams include your primary Cardiologist (physician) and Advanced Practice Providers (APPs -  Physician Assistants and Nurse Practitioners) who all work together to provide you with the care you need, when you need it.  We recommend signing up for the patient portal called "MyChart".  Sign up information is provided on this After Visit Summary.  MyChart is used to connect with patients for Virtual Visits (Telemedicine).  Patients are able to view lab/test results, encounter notes, upcoming appointments, etc.  Non-urgent messages can be sent to your provider as well.   To learn more about what you can do with MyChart, go to [email or web address].    Your next appointment:   6 month(s)  The format for your next appointment:   In Person  Provider:   Shirlee More, MD    Other Instructions   Important Information About Sugar

## 2022-09-19 ENCOUNTER — Ambulatory Visit: Payer: Medicaid Other | Admitting: Critical Care Medicine

## 2022-09-19 LAB — LIPID PANEL
Chol/HDL Ratio: 3.2 ratio (ref 0.0–5.0)
Cholesterol, Total: 163 mg/dL (ref 100–199)
HDL: 51 mg/dL (ref >39)
LDL Chol Calc (NIH): 93 mg/dL (ref 0–99)
Triglycerides: 105 mg/dL (ref 0–149)
VLDL Cholesterol Cal: 19 mg/dL (ref 5–40)

## 2022-09-19 LAB — LIPOPROTEIN A (LPA): Lipoprotein (a): 156.6 nmol/L — ABNORMAL HIGH (ref <75.0)

## 2022-09-19 NOTE — Progress Notes (Deleted)
New Patient Office Visit  Subjective:  Patient ID: Travis Benson, male    DOB: 1961-03-19  Age: 61 y.o. MRN: [id number]  CC:  No chief complaint on file.   HPI 12/26/21 [name] presents for primary care to establish.  This patient was seen by our physician assistant Rabbit Hash in September for initial visit at that visit the patient received a flu vaccine and was referred to physical therapy.  He has had previous above-knee amputation in 2020 and needed further physical therapy and needed to accommodate to his prosthesis.  Note on arrival blood pressure is elevated and he has been off all blood pressure medications.  He also has coronary disease followed closely by cardiology and needs to be on triple therapy because of his familial hypercholesterolemia.  He is only been on the Zetia recently.  He states his blood pressures at home been systolic in the 476 [id number] range but here is elevated.  Patient does not have any other real complaints at this time.  He does complain of some chronic pain in the left shoulder and the right leg and left lower extremity.  Patient does have a history of chronic hepatitis C which is quiescent at this time.  The patient does need colon cancer screening. The patient agreed to and did receive the pneumonia vaccine Prevnar 20 at this visit  9/21  Hypertension    Hypertension not being controlled due to lack of access and use of medication  Plan to begin valsartan 40 mg daily  Discontinue lisinopril  Patient given diet education  Needs short-term follow-up for blood pressure management      Relevant Medications   valsartan (DIOVAN) 80 MG tablet   Evolocumab (REPATHA) 140 MG/ML SOSY   ezetimibe (ZETIA) 10 MG tablet   rosuvastatin (CRESTOR) 20 MG tablet   Coronary artery disease    Needs intense therapy for familiar hypercholesterolemia  Patient had not picked up his Repatha this was given to him at this visit also Zetia and Crestor will be  continued      Relevant Medications   valsartan (DIOVAN) 80 MG tablet   Evolocumab (REPATHA) 140 MG/ML SOSY   ezetimibe (ZETIA) 10 MG tablet   rosuvastatin (CRESTOR) 20 MG tablet   PAD (peripheral artery disease) ([place])    Patient with peripheral artery disease as well as coronary artery disease again intensive therapy for cholesterol elevation was given      Relevant Medications   valsartan (DIOVAN) 80 MG tablet   Evolocumab (REPATHA) 140 MG/ML SOSY   ezetimibe (ZETIA) 10 MG tablet   rosuvastatin (CRESTOR) 20 MG tablet     Digestive   History of hepatitis C    Currently quiescent after therapy        Musculoskeletal and Integument   Left scapula fracture    Prior history of scapular fracture on the left he does have a torn left rotator cuff        Other   Familial hypercholesterolemia    Management as per cardiology current therapy not adherent due to lack of access medications reordered      Relevant Medications   valsartan (DIOVAN) 80 MG tablet   Evolocumab (REPATHA) 140 MG/ML SOSY   ezetimibe (ZETIA) 10 MG tablet   rosuvastatin (CRESTOR) 20 MG tablet   Other Visit Diagnoses     Need for Streptococcus pneumoniae vaccination    -  Primary   Relevant Orders   Pneumococcal conjugate vaccine 20-valent (Completed)   Colon  cancer screening       Relevant Orders   Cologuard   Past Medical History:  Diagnosis Date   Arthritis    neck   Coronary artery disease    Familial hypercholesterolemia 12/26/2021   History of hepatitis C    Hypertension    Left scapula fracture 03/02/2019   PAD (peripheral artery disease) ([place]) 12/26/2021    Past Surgical History:  Procedure Laterality Date   AMPUTATION Right 04/13/2019   Procedure: AMPUTATION ABOVE KNEE;  Surgeon: Altamese Estelline, MD;  Location: [place];  Service: Orthopedics;  Laterality: Right;   APPLICATION OF WOUND VAC Bilateral 03/05/2019   Procedure: WOUND VAC CHANGE RIGHT LOWER LEG; REMOVAL OF WOUND VAC LEFT LOWER  LEG WITH APPLICATION OF DRESSINGS;  Surgeon: Rosetta Posner, MD;  Location: [place];  Service: Vascular;  Laterality: Bilateral;   APPLICATION OF WOUND VAC Right 04/09/2019   Procedure: APPLICATION OF WOUND VAC;  Surgeon: Altamese Sumner, MD;  Location: [place];  Service: Orthopedics;  Laterality: Right;   APPLICATION OF WOUND VAC Bilateral 03/02/2019   Procedure: Application Of Wound Vac;  Surgeon: Altamese [place], MD;  Location: [place];  Service: Orthopedics;  Laterality: Bilateral;   APPLICATION OF WOUND VAC Right 03/02/2019   Procedure: Wound Vac dressing removal;  Surgeon: Waynetta Sandy, MD;  Location: [place];  Service: Vascular;  Laterality: Right;   EXTERNAL FIXATION LEG Right 03/02/2019   Procedure: External Fixation Leg;  Surgeon: Altamese [place], MD;  Location: [place];  Service: Orthopedics;  Laterality: Right;   EXTERNAL FIXATION REMOVAL Left 03/02/2019   Procedure: REMOVAL EXTERNAL FIXATION LEG;  Surgeon: Altamese Boyd, MD;  Location: [place];  Service: Orthopedics;  Laterality: Left;   FASCIOTOMY Right 02/28/2019   Procedure: FOUR COMPARTMENT FASCIOTOMY OF RIGHT LOWER LEG;  Surgeon: Waynetta Sandy, MD;  Location: [place];  Service: Vascular;  Laterality: Right;   FEMORAL-POPLITEAL BYPASS GRAFT Right 02/28/2019   Procedure: BYPASS GRAFT OF ABOVE KNEE POPLITEAL- BELOW KNEE POPLITEAL ARTERY;  Surgeon: Waynetta Sandy, MD;  Location: [place];  Service: Vascular;  Laterality: Right;   FEMUR IM NAIL Left 03/02/2019   Procedure: INTRAMEDULLARY (IM) RETROGRADE FEMORAL NAILING;  Surgeon: Altamese Chilili, MD;  Location: [facility];  Service: Orthopedics;  Laterality: Left;   HARDWARE REMOVAL Left 11/19/2019   Procedure: HARDWARE REMOVAL LEFT TIBIA;  Surgeon: Altamese Wann, MD;  Location: [place];  Service: Orthopedics;  Laterality: Left;   I & D EXTREMITY Left 02/28/2019   Procedure: IRRIGATION AND DEBRIDEMENT OF LEFT LOWER LEG /INTERNAL FIXATION OF LEFT TIBIA PLACEMENT OF FRACTURE PINLEFT FEMUR/  CLOSED REDUCTION OF LEFT FEMUR.;  Surgeon: Paralee Cancel, MD;  Location: [place];  Service: Orthopedics;  Laterality: Left;   I & D EXTREMITY Right 04/09/2019   Procedure: IRRIGATION AND DEBRIDEMENT EXTREMITY R lower leg;  Surgeon: Altamese Alpine, MD;  Location: [place];  Service: Orthopedics;  Laterality: Right;   I & D EXTREMITY Right 04/13/2019   Procedure: IRRIGATION AND DEBRIDEMENT EXTREMITY Right Leg;  Surgeon: Altamese Underwood[place]Peters[place], MD;  Location: [place];  Service: Orthopedics;  Laterality: Right;   I & D EXTREMITY Left 03/02/2019   Procedure: IRRIGATION AND DEBRIDEMENT LEG;  Surgeon: Altamese [place], MD;  Location: [facility];  Service: Orthopedics;  Laterality: Left;   ORIF PELVIC FRACTURE WITH PERCUTANEOUS SCREWS Right 03/02/2019   Procedure: Orif Pelvic Fracture With Percutaneous Screws;  Surgeon: Altamese Bowlus, MD;  Location: [place];  Service: Orthopedics;  Laterality: Right;   SKIN SPLIT  GRAFT Right 03/26/2019   Procedure: SKIN GRAFT SPLIT THICKNESS;  Surgeon: Altamese Crowley, MD;  Location: [facility];  Service: Orthopedics;  Laterality: Right;   TIBIA IM NAIL INSERTION Left 03/02/2019   Procedure: INTRAMEDULLARY (IM) NAIL TIBIAL;  Surgeon: Altamese [place], MD;  Location: [place];  Service: Orthopedics;  Laterality: Left;    Family History  Problem Relation Age of Onset   Kidney failure Mother    Diabetes Father     Social History   Socioeconomic History   Marital status: Married    Spouse name: Not on file   Number of children: Not on file   Years of education: Not on file   Highest education level: Not on file  Occupational History   Not on file  Tobacco Use   Smoking status: Never   Smokeless tobacco: Never  Vaping Use   Vaping Use: Never used  Substance and Sexual Activity   Alcohol use: Never   Drug use: Never   Sexual activity: Not Currently    Birth control/protection: None  Other Topics Concern   Not on file  Social History Narrative   ** Merged History Encounter **       Social  Determinants of Health   Financial Resource Strain: Unknown (04/01/2019)   Overall Financial Resource Strain (CARDIA)    Difficulty of Paying Living Expenses: Patient refused  Food Insecurity: Unknown (04/01/2019)   Hunger Vital Sign    Worried About Running Out of Food in the Last Year: Patient refused    [place] in the Last Year: Patient refused  Transportation Needs: Unknown (04/01/2019)   PRAPARE - Transportation    Lack of Transportation (Medical): Patient refused    Lack of Transportation (Non-Medical): Patient refused  Physical Activity: Unknown (04/01/2019)   Exercise Vital Sign    Days of Exercise per Week: Patient refused    Minutes of Exercise per Session: Patient refused  Stress: Unknown (04/01/2019)   [place]    Feeling of Stress : Patient refused  Social Connections: Unknown (04/01/2019)   Social Connection and Isolation Panel [NHANES]    Frequency of Communication with Friends and Family: Patient refused    Frequency of Social Gatherings with Friends and Family: Patient refused    Attends Religious Services: Patient refused    Active Member of Clubs or Organizations: Patient refused    Attends Archivist Meetings: Patient refused    Marital Status: Patient refused  Intimate Partner Violence: Unknown (04/01/2019)   Humiliation, Afraid, Rape, and Kick questionnaire    Fear of Current or Ex-Partner: Patient refused    Emotionally Abused: Patient refused    Physically Abused: Patient refused    Sexually Abused: Patient refused    Outpatient Medications Prior to Visit  Medication Sig Dispense Refill   aspirin EC 81 MG EC tablet Take 1 tablet (81 mg total) by mouth daily.     Blood Pressure Monitor DEVI Use to check blood pressure daily 1 each 0   Evolocumab (REPATHA) 140 MG/ML SOSY Inject 140 mg into the skin every 14 (fourteen) days. (Patient not taking: Reported on 06/26/2022) 2 mL 1    ezetimibe (ZETIA) 10 MG tablet TAKE 1 TABLET (10 MG TOTAL) BY MOUTH DAILY. 90 tablet 1   gabapentin (NEURONTIN) 300 MG capsule Take 1 capsule (300 mg total) by mouth 3 (three) times daily as needed. TAKE as needed (Patient taking differently: Take 300 mg by mouth 3 (  three) times daily as needed (Nerve pain).) 120 capsule 5   Misc. Devices MISC Single Loftstrand crutches.  Diagnosis unstable gait. 1 each 0   rosuvastatin (CRESTOR) 20 MG tablet Take 1 tablet (20 mg total) by mouth daily. 90 tablet 3   valsartan-hydrochlorothiazide (DIOVAN HCT) 160-12.5 MG tablet Take 1 tablet by mouth daily. 30 tablet 5   No facility-administered medications prior to visit.    Allergies  Allergen Reactions   Contrast Media [Iodinated Contrast Media] Hives     States took Benedryl for couple days    ROS Review of Systems  Constitutional:  Negative for chills, diaphoresis and fever.  HENT:  Negative for congestion, hearing loss, nosebleeds, sore throat and tinnitus.   Eyes:  Negative for photophobia and redness.  Respiratory:  Negative for cough, shortness of breath, wheezing and stridor.   Cardiovascular:  Negative for chest pain, palpitations and leg swelling.  Gastrointestinal:  Negative for abdominal pain, blood in stool, constipation, diarrhea, nausea and vomiting.  Endocrine: Negative for polydipsia.  Genitourinary:  Negative for dysuria, flank pain, frequency, hematuria and urgency.  Musculoskeletal:  Positive for back pain and gait problem. Negative for myalgias and neck pain.  Skin:  Negative for rash.  Allergic/Immunologic: Negative for environmental allergies.  Neurological:  Negative for dizziness, tremors, seizures, weakness and headaches.  Hematological:  Does not bruise/bleed easily.  Psychiatric/Behavioral: Negative.  Negative for suicidal ideas. The patient is not nervous/anxious.       Objective:    Physical Exam Vitals reviewed.  Constitutional:      Appearance: Normal  appearance. He is well-developed. He is not diaphoretic.  HENT:     Head: Normocephalic and atraumatic.     Nose: No nasal deformity, septal deviation, mucosal edema or rhinorrhea.     Right Sinus: No maxillary sinus tenderness or frontal sinus tenderness.     Left Sinus: No maxillary sinus tenderness or frontal sinus tenderness.     Mouth/Throat:     Pharynx: No oropharyngeal exudate.     Comments: Poor dentition Eyes:     General: No scleral icterus.    Conjunctiva/sclera: Conjunctivae normal.     Pupils: Pupils are equal, round, and reactive to light.  Neck:     Thyroid: No thyromegaly.     Vascular: No carotid bruit or JVD.     Trachea: Trachea normal. No tracheal tenderness or tracheal deviation.  Cardiovascular:     Rate and Rhythm: Normal rate and regular rhythm.     Chest Wall: PMI is not displaced.     Pulses: Normal pulses. No decreased pulses.     Heart sounds: Normal heart sounds, S1 normal and S2 normal. Heart sounds not distant. No murmur heard.    No systolic murmur is present.     No diastolic murmur is present.     No friction rub. No gallop. No S3 or S4 sounds.  Pulmonary:     Effort: Pulmonary effort is normal. No tachypnea, accessory muscle usage or respiratory distress.     Breath sounds: Normal breath sounds. No stridor. No decreased breath sounds, wheezing, rhonchi or rales.  Chest:     Chest wall: No tenderness.  Abdominal:     General: Bowel sounds are normal. There is no distension.     Palpations: Abdomen is soft. Abdomen is not rigid.     Tenderness: There is no abdominal tenderness. There is no guarding or rebound.  Musculoskeletal:        General: Normal range  of motion.     Cervical back: Normal range of motion and neck supple. No edema, erythema or rigidity. No muscular tenderness. Normal range of motion.     Comments: Right above-knee amputation in place and prosthesis working  Lymphadenopathy:     Head:     Right side of head: No submental or  submandibular adenopathy.     Left side of head: No submental or submandibular adenopathy.     Cervical: No cervical adenopathy.  Skin:    General: Skin is warm and dry.     Coloration: Skin is not pale.     Findings: No rash.     Nails: There is no clubbing.  Neurological:     General: No focal deficit present.     Mental Status: He is alert and oriented to person, place, and time. Mental status is at baseline.     Sensory: No sensory deficit.  Psychiatric:        Mood and Affect: Mood normal.        Speech: Speech normal.        Behavior: Behavior normal.        Thought Content: Thought content normal.        Judgment: Judgment normal.     There were no vitals taken for this visit. Wt Readings from Last 3 Encounters:  09/18/22 237 lb (107.5 kg)  12/26/21 247 lb 3.2 oz (112.1 kg)  10/10/21 240 lb (108.9 kg)     Health Maintenance Due  Topic Date Due   COVID-19 Vaccine (1) Never done   Zoster Vaccines- Shingrix (1 of 2) Never done   INFLUENZA VACCINE  07/30/2022    There are no preventive care reminders to display for this patient.  No results found for: "TSH" Lab Results  Component Value Date   WBC 5.5 09/26/2021   HGB 15.1 09/26/2021   HCT 45.0 09/26/2021   MCV 88 09/26/2021   PLT 484 (H) 09/26/2021   Lab Results  Component Value Date   NA 140 09/26/2021   K 4.5 09/26/2021   CO2 20 09/26/2021   GLUCOSE 92 09/26/2021   BUN 13 09/26/2021   CREATININE 1.30 (H) 09/26/2021   BILITOT 0.2 09/26/2021   ALKPHOS 167 (H) 09/26/2021   AST 21 09/26/2021   ALT 13 09/26/2021   PROT 8.0 09/26/2021   ALBUMIN 4.5 09/26/2021   CALCIUM 9.6 09/26/2021   ANIONGAP 13 11/19/2019   EGFR 63 09/26/2021   Lab Results  Component Value Date   CHOL 318 (H) 09/26/2021   Lab Results  Component Value Date   HDL 44 09/26/2021   Lab Results  Component Value Date   LDLCALC 230 (H) 09/26/2021   Lab Results  Component Value Date   TRIG 220 (H) 09/26/2021   Lab Results   Component Value Date   CHOLHDL 7.2 (H) 09/26/2021   No results found for: "HGBA1C"    Assessment & Plan:   Problem List Items Addressed This Visit   None  No orders of the defined types were placed in this encounter.   Follow-up: No follow-ups on file.    Asencion Noble, MD

## 2022-09-24 ENCOUNTER — Other Ambulatory Visit: Payer: Self-pay

## 2022-09-24 ENCOUNTER — Other Ambulatory Visit: Payer: Medicaid Other | Admitting: Pharmacist

## 2022-09-24 NOTE — Progress Notes (Signed)
Care Coordination Call  Contacted patient to follow up on appointment with Dr. Bettina Gavia last week. Patient reviewed appointment, notes that he has not yet picked up his new valsartan/HCTZ yet. Notes he is awaiting a call from the lipid clinic to see a pharmacist to restart [id number]. Will collaborate with pharmacy colleagues.   Catie Hedwig Morton, PharmD, [facility] [phone]

## 2022-09-26 ENCOUNTER — Ambulatory Visit: Payer: Medicaid Other | Attending: Cardiovascular Disease | Admitting: Pharmacist

## 2022-09-26 ENCOUNTER — Telehealth: Payer: Self-pay | Admitting: Pharmacist

## 2022-09-26 ENCOUNTER — Other Ambulatory Visit: Payer: Self-pay

## 2022-09-26 DIAGNOSIS — E7801 Familial hypercholesterolemia: Secondary | ICD-10-CM | POA: Diagnosis not present

## 2022-09-26 DIAGNOSIS — I2510 Atherosclerotic heart disease of native coronary artery without angina pectoris: Secondary | ICD-10-CM

## 2022-09-26 MED ORDER — REPATHA 140 MG/ML ~~LOC~~ SOSY
140.0000 mg | PREFILLED_SYRINGE | SUBCUTANEOUS | 11 refills | Status: DC
  Filled 2022-09-26: qty 2, 28d supply, fill #0
  Filled 2022-10-25: qty 2, 28d supply, fill #1
  Filled 2022-12-03: qty 2, 28d supply, fill #2
  Filled 2023-02-26 (×2): qty 2, 28d supply, fill #3
  Filled 2023-04-01: qty 2, 28d supply, fill #4
  Filled 2023-05-14: qty 2, 28d supply, fill #5
  Filled 2023-06-11: qty 2, 28d supply, fill #6
  Filled 2023-07-11: qty 2, 28d supply, fill #7
  Filled 2023-08-10: qty 2, 28d supply, fill #8
  Filled 2023-09-04: qty 2, 28d supply, fill #9

## 2022-09-26 NOTE — Patient Instructions (Addendum)
We will submit a prior authorization for Repatha. I will let you know what your insurance says. Please call me at [phone] with any questions IT help desk for mychart [phone]

## 2022-09-26 NOTE — Progress Notes (Signed)
Patient ID: Travis Benson                 DOB: 06-09-1961                    MRN: [id number]      HPI: Lincon Sahlin is a 61 y.o. male patient referred to lipid clinic by Dr. Bettina Gavia. PMH is significant for hypertension, familiar hyperlipidemia with elevated LP(a) coronary calcium score on CT of 6.4, cardiac CTA showed mild CAD in the LAD and ramus 25 to 59% minimal in the right coronary artery left circumflex less than 25% and PAD. He has a baseline LDL-C of 230.  Current Medications: rosuvastatin 20mg  daily, ezetimibe 10mg  daily Intolerances: none Risk Factors: PAD, CAD, ASCVD risk score 30.1% LDL goal: <70  Exercise: limited due to prosthetic leg   Family History:  Family History  Problem Relation Age of Onset   Kidney failure Mother    Diabetes Father     Social History: never smoked, seldom alcohol use  Labs:09/18/22 TC 163, TG 105, HDL 51, LDL-C 93 (rosuvastatin 20mg  daily, ezetimibe 10mg  daily)  Past Medical History:  Diagnosis Date   Arthritis    neck   Coronary artery disease    Familial hypercholesterolemia 12/26/2021   History of hepatitis C    Hypertension    Left scapula fracture 03/02/2019   PAD (peripheral artery disease) ([place]) 12/26/2021    Current Outpatient Medications on File Prior to Visit  Medication Sig Dispense Refill   aspirin EC 81 MG EC tablet Take 1 tablet (81 mg total) by mouth daily.     Blood Pressure Monitor DEVI Use to check blood pressure daily 1 each 0   Evolocumab (REPATHA) 140 MG/ML SOSY Inject 140 mg into the skin every 14 (fourteen) days. (Patient not taking: Reported on 06/26/2022) 2 mL 1   ezetimibe (ZETIA) 10 MG tablet TAKE 1 TABLET (10 MG TOTAL) BY MOUTH DAILY. 90 tablet 1   gabapentin (NEURONTIN) 300 MG capsule Take 1 capsule (300 mg total) by mouth 3 (three) times daily as needed. TAKE as needed (Patient taking differently: Take 300 mg by mouth 3 (three) times daily as needed (Nerve pain).) 120 capsule 5   Misc. Devices MISC  Single Loftstrand crutches.  Diagnosis unstable gait. 1 each 0   rosuvastatin (CRESTOR) 20 MG tablet Take 1 tablet (20 mg total) by mouth daily. 90 tablet 3   valsartan-hydrochlorothiazide (DIOVAN HCT) 160-12.5 MG tablet Take 1 tablet by mouth daily. (Patient not taking: Reported on 09/24/2022) 30 tablet 5   No current facility-administered medications on file prior to visit.    Allergies  Allergen Reactions   Contrast Media [Iodinated Contrast Media] Hives     States took [place] for couple days    Assessment/Plan:  1. Hyperlipidemia - LDL-C is above goal of <70. Patient has CAD and familiar hyperlipidemia (baseline 203). He is on high intensity statin and zetia. LDL-C still above goal of <70. Will submit to insurance for [place]. Patient educated on injection technique and dosing. Unsure when the last denial was. Will appeal if denied. Continue rosuvastatin 20mg  daily and ezetimibe 10mg  daily.  PA submitted Key: [id number]  Addendum: PA for Repatha approved  Thank you,   Ramond Dial, Pharm.D, BCPS, CPP [facility] [address], [place], [place] [address]  Phone: [phone]; Fax: [phone]

## 2022-09-26 NOTE — Telephone Encounter (Signed)
Spoke with pt. Rx for Repatha sent to pharmacy. Labs ordered for pt to complete in [place] in Dec.

## 2022-09-26 NOTE — Telephone Encounter (Signed)
PA for Repatha approved through 09/27/23. LVM for pt to call back

## 2022-09-27 ENCOUNTER — Other Ambulatory Visit: Payer: Self-pay

## 2022-09-30 ENCOUNTER — Other Ambulatory Visit: Payer: Self-pay

## 2022-10-01 ENCOUNTER — Other Ambulatory Visit: Payer: Self-pay

## 2022-10-23 ENCOUNTER — Other Ambulatory Visit: Payer: Self-pay | Admitting: Pharmacist

## 2022-10-23 NOTE — Progress Notes (Unsigned)
Care Coordination Call  Was on a phone appointment with patient's wife when she noted that patient has been having low systolic blood pressures lately - SBP <100. Denies dizziness or lightheadedness. Last antihypertensive change was made by Dr. Bettina Gavia, so I encouraged her to have patient call Dr. Joya Gaskins office for instruction.   Routing to Dr. Bettina Gavia and Dr. Joya Gaskins for [place].   Catie Hedwig Morton, PharmD, [facility] [phone]

## 2022-10-24 NOTE — Patient Instructions (Signed)
[place],   Call Dr. Joya Gaskins office to discuss the low blood pressures.   Thanks!  Catie Hedwig Morton, PharmD, [facility] [phone]

## 2022-10-25 ENCOUNTER — Other Ambulatory Visit: Payer: Self-pay | Admitting: Critical Care Medicine

## 2022-10-25 ENCOUNTER — Other Ambulatory Visit: Payer: Self-pay

## 2022-10-25 MED ORDER — EZETIMIBE 10 MG PO TABS
10.0000 mg | ORAL_TABLET | Freq: Every day | ORAL | 0 refills | Status: DC
  Filled 2022-10-25: qty 90, 90d supply, fill #0

## 2022-10-28 ENCOUNTER — Other Ambulatory Visit: Payer: Self-pay

## 2022-10-31 ENCOUNTER — Telehealth: Payer: Self-pay | Admitting: Cardiology

## 2022-10-31 NOTE — Telephone Encounter (Signed)
  Pt c/o medication issue:  1. Name of Medication:   valsartan-hydrochlorothiazide (DIOVAN HCT) 160-12.5 MG tablet    2. How are you currently taking this medication (dosage and times per day)? Take 1 tablet by mouth daily   3. Are you having a reaction (difficulty breathing--STAT)? No   4. What is your medication issue? Pt said, he started taking this medication and his BP went very low. His BP 96/69 ,106/71, 103/58, he wanted to ask if that's ok and to continue taking this med

## 2022-10-31 NOTE — Telephone Encounter (Signed)
Spoke with pt. Advised to stop m\Diovan for 24 hours then restart 1/2 tablet daily per Dr. Joya Gaskins note. Pt agreed and verbalized understanding. Encouraged to call with any questions or issues.

## 2022-11-05 ENCOUNTER — Telehealth: Payer: Self-pay

## 2022-11-05 NOTE — Telephone Encounter (Signed)
No VM set up.

## 2022-12-02 NOTE — Progress Notes (Deleted)
New Patient Office Visit  Subjective:  Patient ID: Travis Benson, male    DOB: 07/21/1961  Age: 61 y.o. MRN: [id number]  CC:  No chief complaint on file.   HPI 12/26/21 [name] presents for primary care to establish.  This patient was seen by our physician assistant [place] in September for initial visit at that visit the patient received a flu vaccine and was referred to physical therapy.  He has had previous above-knee amputation in 2020 and needed further physical therapy and needed to accommodate to his prosthesis.  Note on arrival blood pressure is elevated and he has been off all blood pressure medications.  He also has coronary disease followed closely by cardiology and needs to be on triple therapy because of his familial hypercholesterolemia.  He is only been on the Zetia recently.  He states his blood pressures at home been systolic in the [id number] range but here is elevated.  Patient does not have any other real complaints at this time.  He does complain of some chronic pain in the left shoulder and the right leg and left lower extremity.  Patient does have a history of chronic hepatitis C which is quiescent at this time.  The patient does need colon cancer screening. The patient agreed to and did receive the pneumonia vaccine Prevnar 20 at this visit  12/03/22 Hypertension   Hypertension not being controlled due to lack of access and use of medication  Plan to begin valsartan 40 mg daily  Discontinue lisinopril  Patient given diet education  Needs short-term follow-up for blood pressure management     Relevant Medications  valsartan (DIOVAN) 80 MG tablet  Evolocumab (REPATHA) 140 MG/ML SOSY  ezetimibe (ZETIA) 10 MG tablet  rosuvastatin (CRESTOR) 20 MG tablet  Coronary artery disease   Needs intense therapy for familiar hypercholesterolemia  Patient had not picked up his Repatha this was given to him at this visit also Zetia and Crestor will be continued      Relevant Medications  valsartan (DIOVAN) 80 MG tablet  Evolocumab (REPATHA) 140 MG/ML SOSY  ezetimibe (ZETIA) 10 MG tablet  rosuvastatin (CRESTOR) 20 MG tablet  PAD (peripheral artery disease) ([place])   Patient with peripheral artery disease as well as coronary artery disease again intensive therapy for cholesterol elevation was given     Relevant Medications  valsartan (DIOVAN) 80 MG tablet  Evolocumab (REPATHA) 140 MG/ML SOSY  ezetimibe (ZETIA) 10 MG tablet  rosuvastatin (CRESTOR) 20 MG tablet  Digestive  History of hepatitis C   Currently quiescent after therapy      Past Medical History:  Diagnosis Date   Arthritis    neck   Coronary artery disease    Familial hypercholesterolemia 12/26/2021   History of hepatitis C    Hypertension    Left scapula fracture 03/02/2019   PAD (peripheral artery disease) ([place]) 12/26/2021    Past Surgical History:  Procedure Laterality Date   AMPUTATION Right 04/13/2019   Procedure: AMPUTATION ABOVE KNEE;  Surgeon: Altamese Saylorsburg, MD;  Location: [facility];  Service: Orthopedics;  Laterality: Right;   APPLICATION OF WOUND VAC Bilateral 03/05/2019   Procedure: WOUND VAC CHANGE RIGHT LOWER LEG; REMOVAL OF WOUND VAC LEFT LOWER LEG WITH APPLICATION OF DRESSINGS;  Surgeon: Rosetta Posner, MD;  Location: [facility];  Service: Vascular;  Laterality: Bilateral;   APPLICATION OF WOUND VAC Right 04/09/2019   Procedure: APPLICATION OF WOUND VAC;  Surgeon: Altamese [place], MD;  Location: [facility];  Service: Orthopedics;  Laterality: Right;   APPLICATION OF WOUND VAC Bilateral 03/02/2019   Procedure: Application Of Wound Vac;  Surgeon: Altamese De Kalb, MD;  Location: [facility];  Service: Orthopedics;  Laterality: Bilateral;   APPLICATION OF WOUND VAC Right 03/02/2019   Procedure: Wound Vac dressing removal;  Surgeon: Waynetta Sandy, MD;  Location: [facility];  Service: Vascular;  Laterality: Right;   EXTERNAL FIXATION LEG Right 03/02/2019   Procedure: External Fixation Leg;   Surgeon: Altamese Walden, MD;  Location: [facility];  Service: Orthopedics;  Laterality: Right;   EXTERNAL FIXATION REMOVAL Left 03/02/2019   Procedure: REMOVAL EXTERNAL FIXATION LEG;  Surgeon: Altamese Lox[place]hatchee Groves, MD;  Location: [facility];  Service: Orthopedics;  Laterality: Left;   FASCIOTOMY Right 02/28/2019   Procedure: FOUR COMPARTMENT FASCIOTOMY OF RIGHT LOWER LEG;  Surgeon: Waynetta Sandy, MD;  Location: [facility];  Service: Vascular;  Laterality: Right;   FEMORAL-POPLITEAL BYPASS GRAFT Right 02/28/2019   Procedure: BYPASS GRAFT OF ABOVE KNEE POPLITEAL- BELOW KNEE POPLITEAL ARTERY;  Surgeon: Waynetta Sandy, MD;  Location: [facility];  Service: Vascular;  Laterality: Right;   FEMUR IM NAIL Left 03/02/2019   Procedure: INTRAMEDULLARY (IM) RETROGRADE FEMORAL NAILING;  Surgeon: Altamese Kimberly, MD;  Location: [facility];  Service: Orthopedics;  Laterality: Left;   HARDWARE REMOVAL Left 11/19/2019   Procedure: HARDWARE REMOVAL LEFT TIBIA;  Surgeon: Altamese Coral, MD;  Location: [facility];  Service: Orthopedics;  Laterality: Left;   I & D EXTREMITY Left 02/28/2019   Procedure: IRRIGATION AND DEBRIDEMENT OF LEFT LOWER LEG /INTERNAL FIXATION OF LEFT TIBIA PLACEMENT OF FRACTURE PINLEFT FEMUR/ CLOSED REDUCTION OF LEFT FEMUR.;  Surgeon: Paralee Cancel, MD;  Location: [facility];  Service: Orthopedics;  Laterality: Left;   I & D EXTREMITY Right 04/09/2019   Procedure: IRRIGATION AND DEBRIDEMENT EXTREMITY R lower leg;  Surgeon: Altamese Loma, MD;  Location: [facility];  Service: Orthopedics;  Laterality: Right;   I & D EXTREMITY Right 04/13/2019   Procedure: IRRIGATION AND DEBRIDEMENT EXTREMITY Right Leg;  Surgeon: Altamese Ramireno, MD;  Location: [facility];  Service: Orthopedics;  Laterality: Right;   I & D EXTREMITY Left 03/02/2019   Procedure: IRRIGATION AND DEBRIDEMENT LEG;  Surgeon: Altamese Salem, MD;  Location: [facility];  Service: Orthopedics;  Laterality: Left;   ORIF PELVIC FRACTURE WITH PERCUTANEOUS SCREWS Right 03/02/2019   Procedure:  Orif Pelvic Fracture With Percutaneous Screws;  Surgeon: Altamese Mitiwanga, MD;  Location: [facility];  Service: Orthopedics;  Laterality: Right;   SKIN SPLIT GRAFT Right 03/26/2019   Procedure: SKIN GRAFT SPLIT THICKNESS;  Surgeon: Altamese Oxford, MD;  Location: [facility];  Service: Orthopedics;  Laterality: Right;   TIBIA IM NAIL INSERTION Left 03/02/2019   Procedure: INTRAMEDULLARY (IM) NAIL TIBIAL;  Surgeon: Altamese H[place]ville, MD;  Location: [facility];  Service: Orthopedics;  Laterality: Left;    Family History  Problem Relation Age of Onset   Kidney failure Mother    Diabetes Father     Social History   Socioeconomic History   Marital status: Married    Spouse name: Not on file   Number of children: Not on file   Years of education: Not on file   Highest education level: Not on file  Occupational History   Not on file  Tobacco Use   Smoking status: Never   Smokeless tobacco: Never  Vaping Use   Vaping Use: Never used  Substance and Sexual Activity   Alcohol use: Never   Drug use: Never  Sexual activity: Not Currently    Birth control/protection: None  Other Topics Concern   Not on file  Social History Narrative   ** Merged History Encounter **       Social Determinants of Health   Financial Resource Strain: Unknown (04/01/2019)   Overall Financial Resource Strain (CARDIA)    Difficulty of Paying Living Expenses: Patient refused  Food Insecurity: Unknown (04/01/2019)   Hunger Vital Sign    Worried About Running Out of Food in the Last Year: Patient refused    [place] in the Last Year: Patient refused  Transportation Needs: Unknown (04/01/2019)   PRAPARE - Transportation    Lack of Transportation (Medical): Patient refused    Lack of Transportation (Non-Medical): Patient refused  Physical Activity: Unknown (04/01/2019)   Exercise Vital Sign    Days of Exercise per Week: Patient refused    Minutes of Exercise per Session: Patient refused  Stress: Unknown (04/01/2019)   [place]    Feeling of Stress : Patient refused  Social Connections: Unknown (04/01/2019)   Social Connection and Isolation Panel [NHANES]    Frequency of Communication with Friends and Family: Patient refused    Frequency of Social Gatherings with Friends and Family: Patient refused    Attends Religious Services: Patient refused    Active Member of Clubs or Organizations: Patient refused    Attends Archivist Meetings: Patient refused    Marital Status: Patient refused  Intimate Partner Violence: Unknown (04/01/2019)   Humiliation, Afraid, Rape, and Kick questionnaire    Fear of Current or Ex-Partner: Patient refused    Emotionally Abused: Patient refused    Physically Abused: Patient refused    Sexually Abused: Patient refused    Outpatient Medications Prior to Visit  Medication Sig Dispense Refill   aspirin EC 81 MG EC tablet Take 1 tablet (81 mg total) by mouth daily.     Blood Pressure Monitor DEVI Use to check blood pressure daily 1 each 0   Evolocumab (REPATHA) 140 MG/ML SOSY Inject 140 mg into the skin every 14 (fourteen) days. 2 mL 11   ezetimibe (ZETIA) 10 MG tablet Take 1 tablet (10 mg total) by mouth daily. 90 tablet 0   gabapentin (NEURONTIN) 300 MG capsule Take 1 capsule (300 mg total) by mouth 3 (three) times daily as needed. TAKE as needed (Patient taking differently: Take 300 mg by mouth 3 (three) times daily as needed (Nerve pain).) 120 capsule 5   Misc. Devices MISC Single [place] crutches.  Diagnosis unstable gait. 1 each 0   rosuvastatin (CRESTOR) 20 MG tablet Take 1 tablet (20 mg total) by mouth daily. 90 tablet 3   valsartan-hydrochlorothiazide (DIOVAN HCT) 160-12.5 MG tablet Take 1 tablet by mouth daily. (Patient not taking: Reported on 09/24/2022) 30 tablet 5   No facility-administered medications prior to visit.    Allergies  Allergen Reactions   Contrast Media [Iodinated Contrast Media]  Hives     States took Benedryl for couple days    ROS Review of Systems  Constitutional:  Negative for chills, diaphoresis and fever.  HENT:  Negative for congestion, hearing loss, nosebleeds, sore throat and tinnitus.   Eyes:  Negative for photophobia and redness.  Respiratory:  Negative for cough, shortness of breath, wheezing and stridor.   Cardiovascular:  Negative for chest pain, palpitations and leg swelling.  Gastrointestinal:  Negative for abdominal pain, blood in stool, constipation, diarrhea, nausea and  vomiting.  Endocrine: Negative for polydipsia.  Genitourinary:  Negative for dysuria, flank pain, frequency, hematuria and urgency.  Musculoskeletal:  Positive for back pain and gait problem. Negative for myalgias and neck pain.  Skin:  Negative for rash.  Allergic/Immunologic: Negative for environmental allergies.  Neurological:  Negative for dizziness, tremors, seizures, weakness and headaches.  Hematological:  Does not bruise/bleed easily.  Psychiatric/Behavioral: Negative.  Negative for suicidal ideas. The patient is not nervous/anxious.       Objective:    Physical Exam Vitals reviewed.  Constitutional:      Appearance: Normal appearance. He is well-developed. He is not diaphoretic.  HENT:     Head: Normocephalic and atraumatic.     Nose: No nasal deformity, septal deviation, mucosal edema or rhinorrhea.     Right Sinus: No maxillary sinus tenderness or frontal sinus tenderness.     Left Sinus: No maxillary sinus tenderness or frontal sinus tenderness.     Mouth/Throat:     Pharynx: No oropharyngeal exudate.     Comments: Poor dentition Eyes:     General: No scleral icterus.    Conjunctiva/sclera: Conjunctivae normal.     Pupils: Pupils are equal, round, and reactive to light.  Neck:     Thyroid: No thyromegaly.     Vascular: No carotid bruit or JVD.     Trachea: Trachea normal. No tracheal tenderness or tracheal deviation.  Cardiovascular:     Rate and  Rhythm: Normal rate and regular rhythm.     Chest Wall: PMI is not displaced.     Pulses: Normal pulses. No decreased pulses.     Heart sounds: Normal heart sounds, S1 normal and S2 normal. Heart sounds not distant. No murmur heard.    No systolic murmur is present.     No diastolic murmur is present.     No friction rub. No gallop. No S3 or S4 sounds.  Pulmonary:     Effort: Pulmonary effort is normal. No tachypnea, accessory muscle usage or respiratory distress.     Breath sounds: Normal breath sounds. No stridor. No decreased breath sounds, wheezing, rhonchi or rales.  Chest:     Chest wall: No tenderness.  Abdominal:     General: Bowel sounds are normal. There is no distension.     Palpations: Abdomen is soft. Abdomen is not rigid.     Tenderness: There is no abdominal tenderness. There is no guarding or rebound.  Musculoskeletal:        General: Normal range of motion.     Cervical back: Normal range of motion and neck supple. No edema, erythema or rigidity. No muscular tenderness. Normal range of motion.     Comments: Right above-knee amputation in place and prosthesis working  Lymphadenopathy:     Head:     Right side of head: No submental or submandibular adenopathy.     Left side of head: No submental or submandibular adenopathy.     Cervical: No cervical adenopathy.  Skin:    General: Skin is warm and dry.     Coloration: Skin is not pale.     Findings: No rash.     Nails: There is no clubbing.  Neurological:     General: No focal deficit present.     Mental Status: He is alert and oriented to person, place, and time. Mental status is at baseline.     Sensory: No sensory deficit.  Psychiatric:        Mood and Affect: Mood  normal.        Speech: Speech normal.        Behavior: Behavior normal.        Thought Content: Thought content normal.        Judgment: Judgment normal.     There were no vitals taken for this visit. Wt Readings from Last 3 Encounters:   09/18/22 237 lb (107.5 kg)  12/26/21 247 lb 3.2 oz (112.1 kg)  10/10/21 240 lb (108.9 kg)     Health Maintenance Due  Topic Date Due   COVID-19 Vaccine (1) Never done   Zoster Vaccines- Shingrix (1 of 2) Never done   INFLUENZA VACCINE  07/30/2022    There are no preventive care reminders to display for this patient.  No results found for: "TSH" Lab Results  Component Value Date   WBC 5.5 09/26/2021   HGB 15.1 09/26/2021   HCT 45.0 09/26/2021   MCV 88 09/26/2021   PLT 484 (H) 09/26/2021   Lab Results  Component Value Date   NA 140 09/26/2021   K 4.5 09/26/2021   CO2 20 09/26/2021   GLUCOSE 92 09/26/2021   BUN 13 09/26/2021   CREATININE 1.30 (H) 09/26/2021   BILITOT 0.2 09/26/2021   ALKPHOS 167 (H) 09/26/2021   AST 21 09/26/2021   ALT 13 09/26/2021   PROT 8.0 09/26/2021   ALBUMIN 4.5 09/26/2021   CALCIUM 9.6 09/26/2021   ANIONGAP 13 11/19/2019   EGFR 63 09/26/2021   Lab Results  Component Value Date   CHOL 163 09/18/2022   Lab Results  Component Value Date   HDL 51 09/18/2022   Lab Results  Component Value Date   LDLCALC 93 09/18/2022   Lab Results  Component Value Date   TRIG 105 09/18/2022   Lab Results  Component Value Date   CHOLHDL 3.2 09/18/2022   No results found for: "HGBA1C"    Assessment & Plan:   Problem List Items Addressed This Visit   None  No orders of the defined types were placed in this encounter.   Follow-up: No follow-ups on file.    Asencion Noble, MD

## 2022-12-03 ENCOUNTER — Other Ambulatory Visit: Payer: Self-pay

## 2022-12-03 ENCOUNTER — Ambulatory Visit: Payer: Medicaid Other | Admitting: Critical Care Medicine

## 2022-12-03 ENCOUNTER — Ambulatory Visit: Payer: Medicaid Other | Attending: Critical Care Medicine | Admitting: Critical Care Medicine

## 2022-12-04 ENCOUNTER — Other Ambulatory Visit: Payer: Self-pay

## 2022-12-05 ENCOUNTER — Other Ambulatory Visit: Payer: Self-pay

## 2023-01-20 ENCOUNTER — Encounter: Payer: Self-pay | Admitting: Pharmacist

## 2023-02-20 ENCOUNTER — Ambulatory Visit: Payer: Medicaid Other | Admitting: Critical Care Medicine

## 2023-02-26 ENCOUNTER — Encounter: Payer: Self-pay | Admitting: Critical Care Medicine

## 2023-02-26 ENCOUNTER — Ambulatory Visit: Payer: Medicaid Other | Attending: Critical Care Medicine | Admitting: Critical Care Medicine

## 2023-02-26 ENCOUNTER — Other Ambulatory Visit: Payer: Self-pay

## 2023-02-26 ENCOUNTER — Other Ambulatory Visit: Payer: Self-pay | Admitting: Critical Care Medicine

## 2023-02-26 VITALS — BP 151/89 | HR 73 | Temp 98.5°F | Ht 72.0 in | Wt 236.0 lb

## 2023-02-26 DIAGNOSIS — S88911A Complete traumatic amputation of right lower leg, level unspecified, initial encounter: Secondary | ICD-10-CM

## 2023-02-26 DIAGNOSIS — E7801 Familial hypercholesterolemia: Secondary | ICD-10-CM

## 2023-02-26 DIAGNOSIS — I10 Essential (primary) hypertension: Secondary | ICD-10-CM

## 2023-02-26 DIAGNOSIS — Z89611 Acquired absence of right leg above knee: Secondary | ICD-10-CM | POA: Insufficient documentation

## 2023-02-26 DIAGNOSIS — I2510 Atherosclerotic heart disease of native coronary artery without angina pectoris: Secondary | ICD-10-CM | POA: Diagnosis not present

## 2023-02-26 MED ORDER — VALSARTAN-HYDROCHLOROTHIAZIDE 80-12.5 MG PO TABS
1.0000 | ORAL_TABLET | Freq: Every day | ORAL | 3 refills | Status: DC
  Filled 2023-02-26 (×2): qty 90, 90d supply, fill #0
  Filled 2023-05-26: qty 90, 90d supply, fill #1
  Filled 2023-09-04: qty 90, 90d supply, fill #2
  Filled 2023-12-02: qty 90, 90d supply, fill #3

## 2023-02-26 MED ORDER — ROSUVASTATIN CALCIUM 20 MG PO TABS
20.0000 mg | ORAL_TABLET | Freq: Every day | ORAL | 3 refills | Status: DC
  Filled 2023-02-26: qty 90, 90d supply, fill #0
  Filled 2023-05-26: qty 90, 90d supply, fill #1
  Filled 2023-09-04: qty 90, 90d supply, fill #2
  Filled 2023-12-02: qty 90, 90d supply, fill #3

## 2023-02-26 MED ORDER — EZETIMIBE 10 MG PO TABS
10.0000 mg | ORAL_TABLET | Freq: Every day | ORAL | 0 refills | Status: DC
  Filled 2023-02-26: qty 90, 90d supply, fill #0

## 2023-02-26 NOTE — Assessment & Plan Note (Signed)
As per cardiology medications refilled

## 2023-02-26 NOTE — Patient Instructions (Signed)
Letter regarding her disability was mailed to your house and have your insurance company send [place] a request for medical records  Reduce valsartan HCT to 80/12-1/2 daily new prescription sent to pharmacy other medications refilled  A referral for new liners for your prosthetic will be sent to [facility]  See Lurena Joiner our clinical pharmacist 3 weeks for blood pressure recheck to see Dr. Joya Gaskins again in 3 months

## 2023-02-26 NOTE — Assessment & Plan Note (Signed)
Continue triple therapy for familial hyperlipidemia and recheck lipid a and lipid panel

## 2023-02-26 NOTE — Assessment & Plan Note (Signed)
Not at goal we will increase dose of valsartan HCT

## 2023-02-26 NOTE — Progress Notes (Signed)
Established Patient Office Visit  Subjective   Patient ID: Travis Benson, male    DOB: 03-20-1961  Age: 62 y.o. MRN: [id number]  Chief Complaint  Patient presents with   Hypertension    HTN. Med refills Pt would like to discuss long term disability & prosthetic Requesting medical records for insurance     Not seen since 11/2021 62 y.o.M HTN CAD PAD familial HLD Patient returns after having been to the lipid clinic and cardiology as documented below at the lipid clinic he was started on [place] as below Lipid clinic 08/2022 Patient ID: Travis Benson                 DOB: [birth year]                    MRN: [id number]  HPI: Travis Benson is a 62 y.o. male patient referred to lipid clinic by Dr. Bettina Gavia. PMH is significant for hypertension, familiar hyperlipidemia with elevated LP(a) coronary calcium score on CT of 6.4, cardiac CTA showed mild CAD in the LAD and ramus 25 to 59% minimal in the right coronary artery left circumflex less than 25% and PAD. He has a baseline LDL-C of 230.   Current Medications: rosuvastatin '20mg'$  daily, ezetimibe '10mg'$  daily Intolerances: none Risk Factors: PAD, CAD, ASCVD risk score 30.1% LDL goal: <70 Cardiology visit with Dr. Bettina Gavia is as below Cards [facility] [place] Mild CAD   2. Familial hypercholesterolemia  3. Elevated Lp(a)  4. Essential hypertension    PLAN:    In order of problems listed above:   1. Stable very concerned about his lipid disorder inadequately treated recheck a lipid profile LP(a) today and refer to the lipid clinic to get access to PCSK9 therapy along with his high intensity statin. 2. Blood pressure not at target optimize treatment increase the dose of his ARB and add thiazide diuretic he has arrangements for follow-up with primary care   Today blood pressure remains elevated and he will need further adjustments in his blood pressure medication when he checks his blood pressure at home it is also high as well.  He needs a letter  regarding his disability status and he needs liners for his prosthesis.  He needs follow-up lipids as well.  He needs refills on multiple medications.  And he declined the flu vaccine      Review of Systems  Constitutional:  Negative for chills, diaphoresis, fever, malaise/fatigue and weight loss.  HENT:  Negative for congestion, hearing loss, nosebleeds, sore throat and tinnitus.   Eyes:  Negative for blurred vision, photophobia and redness.  Respiratory:  Negative for cough, hemoptysis, sputum production, shortness of breath, wheezing and stridor.   Cardiovascular:  Negative for chest pain, palpitations, orthopnea, claudication, leg swelling and PND.  Gastrointestinal:  Negative for abdominal pain, blood in stool, constipation, diarrhea, heartburn, nausea and vomiting.  Genitourinary:  Negative for dysuria, flank pain, frequency, hematuria and urgency.  Musculoskeletal:  Negative for back pain, falls, joint pain, myalgias and neck pain.  Skin:  Negative for itching and rash.  Neurological:  Negative for dizziness, tingling, tremors, sensory change, speech change, focal weakness, seizures, loss of consciousness, weakness and headaches.  Endo/Heme/Allergies:  Negative for environmental allergies and polydipsia. Does not bruise/bleed easily.  Psychiatric/Behavioral:  Negative for depression, memory loss, substance abuse and suicidal ideas. The patient is not nervous/anxious and does not have insomnia.       Objective:     BP (!) 151/89 (BP Location:  Left Arm, Patient Position: Sitting, Cuff Size: Normal)   Pulse 73   Temp 98.5 F (36.9 C) (Oral)   Ht 6' (1.829 m)   Wt 236 lb (107 kg)   SpO2 95%   BMI 32.01 kg/m    Physical Exam Vitals reviewed.  Constitutional:      Appearance: Normal appearance. He is well-developed. He is not diaphoretic.  HENT:     Head: Normocephalic and atraumatic.     Nose: No nasal deformity, septal deviation, mucosal edema or rhinorrhea.     Right  Sinus: No maxillary sinus tenderness or frontal sinus tenderness.     Left Sinus: No maxillary sinus tenderness or frontal sinus tenderness.     Mouth/Throat:     Pharynx: No oropharyngeal exudate.  Eyes:     General: No scleral icterus.    Conjunctiva/sclera: Conjunctivae normal.     Pupils: Pupils are equal, round, and reactive to light.  Neck:     Thyroid: No thyromegaly.     Vascular: No carotid bruit or JVD.     Trachea: Trachea normal. No tracheal tenderness or tracheal deviation.  Cardiovascular:     Rate and Rhythm: Normal rate and regular rhythm.     Chest Wall: PMI is not displaced.     Pulses: Normal pulses. No decreased pulses.     Heart sounds: Normal heart sounds, S1 normal and S2 normal. Heart sounds not distant. No murmur heard.    No systolic murmur is present.     No diastolic murmur is present.     No friction rub. No gallop. No S3 or S4 sounds.  Pulmonary:     Effort: No tachypnea, accessory muscle usage or respiratory distress.     Breath sounds: No stridor. No decreased breath sounds, wheezing, rhonchi or rales.  Chest:     Chest wall: No tenderness.  Abdominal:     General: Bowel sounds are normal. There is no distension.     Palpations: Abdomen is soft. Abdomen is not rigid.     Tenderness: There is no abdominal tenderness. There is no guarding or rebound.  Musculoskeletal:        General: Normal range of motion.     Cervical back: Normal range of motion and neck supple. No edema, erythema or rigidity. No muscular tenderness. Normal range of motion.     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     Left side of head: No submental or submandibular adenopathy.     Cervical: No cervical adenopathy.  Skin:    General: Skin is warm and dry.     Coloration: Skin is not pale.     Findings: No rash.     Nails: There is no clubbing.   Neurological:     Mental Status: He is alert and oriented to person, place, and time.     Sensory: No sensory deficit.  Psychiatric:        Speech: Speech normal.        Behavior: Behavior normal.      No results found for any visits on 02/26/23.    The 10-year ASCVD risk score (Arnett DK, et al., 2019) is: 18.8%    Assessment & Plan:   Problem List Items Addressed This Visit       Cardiovascular and Mediastinum   Hypertension - Primary  Not at goal we will increase dose of valsartan HCT      Relevant Medications   ezetimibe (ZETIA) 10 MG tablet   rosuvastatin (CRESTOR) 20 MG tablet   valsartan-hydrochlorothiazide (DIOVAN-HCT) 80-12.5 MG tablet   Coronary artery disease    As per cardiology medications refilled      Relevant Medications   ezetimibe (ZETIA) 10 MG tablet   rosuvastatin (CRESTOR) 20 MG tablet   valsartan-hydrochlorothiazide (DIOVAN-HCT) 80-12.5 MG tablet     Other   Familial hypercholesterolemia    Continue triple therapy for familial hyperlipidemia and recheck lipid a and lipid panel      Relevant Medications   ezetimibe (ZETIA) 10 MG tablet   rosuvastatin (CRESTOR) 20 MG tablet   valsartan-hydrochlorothiazide (DIOVAN-HCT) 80-12.5 MG tablet   Other Relevant Orders   Lipid panel   Lipoprotein A (LPA)   Amputated right leg ([place])    Patient is disabled amputation persists prosthesis in place      Relevant Orders   For home use only DME Other see comment    Return in about 3 months (around 05/27/2023) for htn.    Asencion Noble, MD

## 2023-02-26 NOTE — Assessment & Plan Note (Signed)
Patient is disabled amputation persists prosthesis in place

## 2023-02-27 ENCOUNTER — Other Ambulatory Visit: Payer: Self-pay

## 2023-02-27 NOTE — Progress Notes (Signed)
Let pt know cholesterol still high, take medication as ordered for cholesterol

## 2023-02-28 ENCOUNTER — Telehealth: Payer: Self-pay

## 2023-02-28 LAB — LIPOPROTEIN A (LPA): Lipoprotein (a): 79.8 nmol/L — ABNORMAL HIGH (ref <75.0)

## 2023-02-28 LAB — LIPID PANEL
Chol/HDL Ratio: 5.7 ratio — ABNORMAL HIGH (ref 0.0–5.0)
Cholesterol, Total: 277 mg/dL — ABNORMAL HIGH (ref 100–199)
HDL: 49 mg/dL (ref >39)
LDL Chol Calc (NIH): 203 mg/dL — ABNORMAL HIGH (ref 0–99)
Triglycerides: 135 mg/dL (ref 0–149)
VLDL Cholesterol Cal: 25 mg/dL (ref 5–40)

## 2023-02-28 NOTE — Telephone Encounter (Signed)
-----   Message from Elsie Stain, MD sent at 02/27/2023 12:17 PM EST ----- Let pt know cholesterol still high, take medication as ordered for cholesterol

## 2023-02-28 NOTE — Telephone Encounter (Signed)
Patient has already been  made aware of results (see other message)

## 2023-02-28 NOTE — Telephone Encounter (Signed)
Pt called for lab results. Shared provider's note. No questions.  Elsie Stain, MD 02/27/2023 12:17 PM EST Back to Top    Let pt know cholesterol still high, take medication as ordered for cholesterol

## 2023-03-04 ENCOUNTER — Other Ambulatory Visit: Payer: Self-pay

## 2023-03-11 ENCOUNTER — Telehealth: Payer: Self-pay | Admitting: Emergency Medicine

## 2023-03-11 NOTE — Telephone Encounter (Signed)
Copied from [place] [id number]. Topic: General - Other >> Mar 11, 2023 10:03 AM Everette C wrote: Reason for CRM: The patient has called to follow up on a previous request for a prescription for prosthetic liners that was to be sent to [facility]   The patient has been in contact with [facility] and told that no prescription has been submitted for them   Please contact the patient further when possible

## 2023-03-11 NOTE — Telephone Encounter (Signed)
Order faxed to [facility]: [employer] for new liner for prosthetic leg. Confirmation received .

## 2023-03-11 NOTE — Telephone Encounter (Addendum)
This was sent last week  I reprinted and put on your desk cassie

## 2023-03-17 NOTE — Telephone Encounter (Signed)
noted 

## 2023-03-22 NOTE — Progress Notes (Unsigned)
Cardiology Office Note:    Date:  03/24/2023   ID:  Travis Benson, DOB 1961/08/31, MRN [id number]  PCP:  Elsie Stain, MD  Cardiologist:  Shirlee More, MD    Referring MD: Elsie Stain, MD    ASSESSMENT:    1. Mild CAD   2. Familial hypercholesterolemia   3. Elevated Lp(a)   4. Essential hypertension    PLAN:    In order of problems listed above:  Stable CAD continue medical therapy including aspirin optimize lipid-lowering he is now using Repatha and a high intensity statin and recheck a lipid profile today Continue his current antihypertensives   Next appointment: 6 months   Medication Adjustments/Labs and Tests Ordered: Current medicines are reviewed at length with the patient today.  Concerns regarding medicines are outlined above.  Orders Placed This Encounter  Procedures   Comp Met (CMET)   Lipid Profile   No orders of the defined types were placed in this encounter.  Follow-up severe and CAD   History of Present Illness:    Travis Benson is a 62 y.o. male with a hx of hypertension hyperlipidemia with elevated LP(a) coronary calcium score on CT of 6.4 and mild CAD on cardiac CTA with mild CAD last seen 09/18/2022.   Compliance with diet, lifestyle and medications: Yes  When he has last labs drawn he acknowledges he was not using Repatha he has been back on it for 2 cycles we will recheck his lipid profile today I asked him to have his children screened for familial hyperlipidemia and elevated LP(a) No edema angina shortness of breath palpitation or syncope Past Medical History:  Diagnosis Date   Arthritis    neck   Coronary artery disease    Familial hypercholesterolemia 12/26/2021   History of hepatitis C    Hypertension    Left scapula fracture 03/02/2019   PAD (peripheral artery disease) ([place]) 12/26/2021    Past Surgical History:  Procedure Laterality Date   AMPUTATION Right 04/13/2019   Procedure: AMPUTATION ABOVE KNEE;  Surgeon:  Altamese Damar, MD;  Location: [place];  Service: Orthopedics;  Laterality: Right;   APPLICATION OF WOUND VAC Bilateral 03/05/2019   Procedure: WOUND VAC CHANGE RIGHT LOWER LEG; REMOVAL OF WOUND VAC LEFT LOWER LEG WITH APPLICATION OF DRESSINGS;  Surgeon: Rosetta Posner, MD;  Location: [place];  Service: Vascular;  Laterality: Bilateral;   APPLICATION OF WOUND VAC Right 04/09/2019   Procedure: APPLICATION OF WOUND VAC;  Surgeon: Altamese [place], MD;  Location: [place];  Service: Orthopedics;  Laterality: Right;   APPLICATION OF WOUND VAC Bilateral 03/02/2019   Procedure: Application Of Wound Vac;  Surgeon: Altamese [place], MD;  Location: [place];  Service: Orthopedics;  Laterality: Bilateral;   APPLICATION OF WOUND VAC Right 03/02/2019   Procedure: Wound Vac dressing removal;  Surgeon: Waynetta Sandy, MD;  Location: [place];  Service: Vascular;  Laterality: Right;   EXTERNAL FIXATION LEG Right 03/02/2019   Procedure: External Fixation Leg;  Surgeon: Altamese Ferdinand, MD;  Location: [place];  Service: Orthopedics;  Laterality: Right;   EXTERNAL FIXATION REMOVAL Left 03/02/2019   Procedure: REMOVAL EXTERNAL FIXATION LEG;  Surgeon: Altamese , MD;  Location: [place];  Service: Orthopedics;  Laterality: Left;   FASCIOTOMY Right 02/28/2019   Procedure: FOUR COMPARTMENT FASCIOTOMY OF RIGHT LOWER LEG;  Surgeon: Waynetta Sandy, MD;  Location: [place];  Service: Vascular;  Laterality: Right;   FEMORAL-POPLITEAL BYPASS GRAFT Right 02/28/2019   Procedure: BYPASS GRAFT  OF ABOVE KNEE POPLITEAL- BELOW KNEE POPLITEAL ARTERY;  Surgeon: Waynetta Sandy, MD;  Location: [place];  Service: Vascular;  Laterality: Right;   FEMUR IM NAIL Left 03/02/2019   Procedure: INTRAMEDULLARY (IM) RETROGRADE FEMORAL NAILING;  Surgeon: Altamese Waleska, MD;  Location: [place];  Service: Orthopedics;  Laterality: Left;   HARDWARE REMOVAL Left 11/19/2019   Procedure: HARDWARE REMOVAL LEFT TIBIA;  Surgeon: Altamese [place], MD;  Location: [place];   Service: Orthopedics;  Laterality: Left;   I & D EXTREMITY Left 02/28/2019   Procedure: IRRIGATION AND DEBRIDEMENT OF LEFT LOWER LEG /INTERNAL FIXATION OF LEFT TIBIA PLACEMENT OF FRACTURE PINLEFT FEMUR/ CLOSED REDUCTION OF LEFT FEMUR.;  Surgeon: Paralee Cancel, MD;  Location: [place];  Service: Orthopedics;  Laterality: Left;   I & D EXTREMITY Right 04/09/2019   Procedure: IRRIGATION AND DEBRIDEMENT EXTREMITY R lower leg;  Surgeon: Altamese Slaughter, MD;  Location: [place];  Service: Orthopedics;  Laterality: Right;   I & D EXTREMITY Right 04/13/2019   Procedure: IRRIGATION AND DEBRIDEMENT EXTREMITY Right Leg;  Surgeon: Altamese Gaylord, MD;  Location: [place];  Service: Orthopedics;  Laterality: Right;   I & D EXTREMITY Left 03/02/2019   Procedure: IRRIGATION AND DEBRIDEMENT LEG;  Surgeon: Altamese [place], MD;  Location: [place];  Service: Orthopedics;  Laterality: Left;   ORIF PELVIC FRACTURE WITH PERCUTANEOUS SCREWS Right 03/02/2019   Procedure: Orif Pelvic Fracture With Percutaneous Screws;  Surgeon: Altamese Haverhill, MD;  Location: [place];  Service: Orthopedics;  Laterality: Right;   SKIN SPLIT GRAFT Right 03/26/2019   Procedure: SKIN GRAFT SPLIT THICKNESS;  Surgeon: Altamese Ainsworth, MD;  Location: [place];  Service: Orthopedics;  Laterality: Right;   TIBIA IM NAIL INSERTION Left 03/02/2019   Procedure: INTRAMEDULLARY (IM) NAIL TIBIAL;  Surgeon: Altamese [place], MD;  Location: [place];  Service: Orthopedics;  Laterality: Left;    Current Medications: Current Meds  Medication Sig   aspirin EC 81 MG EC tablet Take 1 tablet (81 mg total) by mouth daily.   Blood Pressure Monitor DEVI Use to check blood pressure daily   Evolocumab (REPATHA) 140 MG/ML SOSY Inject 140 mg into the skin every 14 (fourteen) days.   ezetimibe (ZETIA) 10 MG tablet Take 1 tablet (10 mg total) by mouth daily.   Misc. Devices MISC Single [place] crutches.  Diagnosis unstable gait.   rosuvastatin (CRESTOR) 20 MG tablet Take 1 tablet (20 mg total) by  mouth daily.   valsartan-hydrochlorothiazide (DIOVAN-HCT) 80-12.5 MG tablet Take 1 tablet by mouth daily.     Allergies:   Contrast media [iodinated contrast media]   Social History   Socioeconomic History   Marital status: Married    Spouse name: Not on file   Number of children: Not on file   Years of education: Not on file   Highest education level: Not on file  Occupational History   Not on file  Tobacco Use   Smoking status: Never   Smokeless tobacco: Never  Vaping Use   Vaping Use: Never used  Substance and Sexual Activity   Alcohol use: Never   Drug use: Never   Sexual activity: Not Currently    Birth control/protection: None  Other Topics Concern   Not on file  Social History Narrative   ** Merged History Encounter **       Social Determinants of Health   Financial Resource Strain: Unknown (04/01/2019)   Overall Financial Resource Strain (CARDIA)    Difficulty of Paying Living Expenses: Patient  declined  Food Insecurity: Unknown (04/01/2019)   Hunger Vital Sign    Worried About Running Out of Food in the Last Year: Patient declined    [place] in the Last Year: Patient declined  Transportation Needs: Unknown (04/01/2019)   [place] - Hydrologist (Medical): Patient declined    Lack of Transportation (Non-Medical): Patient declined  Physical Activity: Unknown (04/01/2019)   Exercise Vital Sign    Days of Exercise per Week: Patient declined    Minutes of Exercise per Session: Patient declined  Stress: Unknown (04/01/2019)   [place]    Feeling of Stress : Patient declined  Social Connections: Unknown (04/01/2019)   Social Connection and Isolation Panel [NHANES]    Frequency of Communication with Friends and Family: Patient declined    Frequency of Social Gatherings with Friends and Family: Patient declined    Attends Religious Services: Patient declined    Building surveyor or Organizations: Patient declined    Attends Archivist Meetings: Patient declined    Marital Status: Patient declined     Family History: The patient's family history includes Diabetes in his father; Kidney failure in his mother. ROS:   Please see the history of present illness.    All other systems reviewed and are negative.  EKGs/Labs/Other Studies Reviewed:    The following studies were reviewed today:  Cardiac Studies & Procedures          CT SCANS  CT CORONARY MORPH W/CTA COR W/SCORE 09/29/2020  Addendum 09/29/2020 10:45 AM ADDENDUM REPORT: 09/29/2020 10:43  EXAM: OVER-READ INTERPRETATION  CT CHEST  The following report is an over-read performed by radiologist Dr. Estanislado Pandy [facility] on 09/29/2020. This over-read does not include interpretation of cardiac or coronary anatomy or pathology. The coronary CTA/coronary calcium score interpretation by the cardiologist is attached.  COMPARISON:  07/11/2020  FINDINGS: Normal caliber of the visualized thoracic aorta. No significant pericardial fluid. Small hiatal hernia with mild wall thickening in the distal esophagus. Main pulmonary arteries are patent. Visualized mediastinal and hilar structures are unremarkable. Images of the upper abdomen are unremarkable. No large pleural effusions. New peripheral densities at the posterior left lower lung are nonspecific but may represent atelectasis. No significant consolidation in the visualized lungs. Again noted is a compression deformity involving a thoracic vertebral body with evidence of prominent Schmorl's nodes.  IMPRESSION: 1. Small hiatal hernia with mild wall thickening in the distal esophagus. 2. Few densities at the left lung base, likely related to atelectasis.   Electronically Signed By: Markus Daft M.D. On: 09/29/2020 10:43  Narrative CLINICAL DATA:  Chest pain  EXAM: Cardiac/Coronary CTA  TECHNIQUE: The  patient was scanned on a [employer]. A 100 kV prospective scan was triggered in the descending thoracic aorta at 111 HU's. Axial non-contrast 3 mm slices were carried out through the heart. The data set was analyzed on a dedicated work station and scored using the [place]. Gantry rotation speed was 250 msecs and collimation was .6 mm. No beta blockade and 0.8 mg of sl NTG was given. The 3D data set was reconstructed in 5% intervals of the 35-75 % of the R-R cycle. Diastolic phases were analyzed on a dedicated work station using MPR, MIP and VRT modes. The patient received 80 cc of contrast.  FINDINGS: Image quality: excellent.  Noise artifact is: Limited.  Coronary Arteries:  Normal  coronary origin.  Right dominance.  Left main: The left main is a large caliber vessel with a normal take off from the left coronary cusp that trifurcates into a LAD, LCX, and ramus intermedius. There is minimal calcified plaque (<25%).  Left anterior descending artery: The proximal LAD contains minimal (<25%) non-calcified plaque. The mid LAD contains mild non-calcified plaque (25-49%. The distal LAD contains minimal non-calcified plaque (<25%). The first diagonal is diffusely diseased with mild non-calcified plaque (25-49%). The second diagonal is patent.  Ramus intermedius: Mild non-calcified plaque (25-49%).  Left circumflex artery: The LCX is non-dominant. There is minimal non-calcified plaque (<25%). The LCX gives off 2 patent obtuse marginal branches.  Right coronary artery: The RCA is dominant with normal take off from the right coronary cusp. There is diffuse minimal non-calcified plaque (<25%). The RCA terminates as a PDA and right posterolateral branch without evidence of plaque or stenosis.  Right Atrium: Right atrial size is within normal limits.  Right Ventricle: The right ventricular cavity is within normal limits.  Left Atrium: Left atrial size is normal in  size with no left atrial appendage filling defect. A small PFO is present.  Left Ventricle: The ventricular cavity size is within normal limits. There are no stigmata of prior infarction. There is no abnormal filling defect.  Pulmonary arteries: Normal in size without proximal filling defect.  Pulmonary veins: Normal pulmonary venous drainage.  Pericardium: Normal thickness with no significant effusion or calcium present.  Cardiac valves: The aortic valve is trileaflet with trace calcification. The mitral valve is normal structure without significant calcification.  Aorta: Normal caliber with no significant disease.  Extra-cardiac findings: See attached radiology report for non-cardiac structures.  IMPRESSION: 1. Coronary calcium score of 4.2. This was 57th percentile for age- and sex-matched controls.  2. Normal coronary origin with right dominance.  3. Mild CAD in the LAD and ramus intermedius (25-49%).  4. Minimal CAD in the RCA/LCX (<25%).  5. A small PFO is present.  RECOMMENDATIONS: 1. Mild non-obstructive CAD (25-49%). Consider non-atherosclerotic causes of chest pain. Consider preventive therapy and risk factor modification.  Eleonore Chiquito, MD  Electronically Signed: By: Eleonore Chiquito On: 09/28/2020 18:12   CT SCANS  CT CARDIAC SCORING (SELF PAY ONLY) 07/11/2020  Addendum 07/11/2020  6:19 PM ADDENDUM REPORT: 07/11/2020 18:16  CLINICAL DATA:  25M for risk stratification  EXAM: Coronary Calcium Score  TECHNIQUE: The patient was scanned on a [employer] scanner. Axial non-contrast 3 mm slices were carried out through the heart. The data set was analyzed on a dedicated work station and scored using the [place].  FINDINGS: Non-cardiac: See separate report from [facility].  Ascending Aorta: Normal size. 2.9 cm. Mild atherosclerosis in the ascending aorta and aortic root.  Pericardium: Normal  Coronary arteries: Normal origin  and anatomy.  Right dominant.  IMPRESSION: Coronary calcium score of 6.44. This was 59th percentile for age and sex matched control.  Skeet Latch, MD   Electronically Signed By: Skeet Latch On: 07/11/2020 18:16  Narrative EXAM: OVER-READ INTERPRETATION  CT CHEST  The following report is an over-read performed by radiologist Dr. Vinnie Langton of [facility], [place] on 07/11/2020. This over-read does not include interpretation of cardiac or coronary anatomy or pathology. The coronary calcium score interpretation by the cardiologist is attached.  COMPARISON:  None.  FINDINGS: Aortic atherosclerosis. Within the visualized portions of the thorax there are no suspicious appearing pulmonary nodules or masses, there is no acute consolidative airspace disease,  no pleural effusions, no pneumothorax and no lymphadenopathy. Visualized portions of the upper abdomen are unremarkable. Prominent Schmorl's nodes and mild compression of a lower thoracic vertebral body. There are no other aggressive appearing lytic or blastic lesions noted in the visualized portions of the skeleton.  IMPRESSION: 1.  Aortic Atherosclerosis (ICD10-I70.0).  Electronically Signed: By: Vinnie Langton M.D. On: 07/11/2020 14:40            Recent Labs: No results found for requested labs within last 365 days.  Recent Lipid Panel    Component Value Date/Time   CHOL 277 (H) 02/26/2023 1030   TRIG 135 02/26/2023 1030   HDL 49 02/26/2023 1030   CHOLHDL 5.7 (H) 02/26/2023 1030   LDLCALC 203 (H) 02/26/2023 1030    Physical Exam:    VS:  BP 134/86 (BP Location: Right Arm, Patient Position: Sitting)   Pulse 86   Ht 6' (1.829 m)   Wt 228 lb 3.2 oz (103.5 kg)   SpO2 96%   BMI 30.95 kg/m     Wt Readings from Last 3 Encounters:  03/24/23 228 lb 3.2 oz (103.5 kg)  02/26/23 236 lb (107 kg)  09/18/22 237 lb (107.5 kg)     GEN:  Well nourished, well developed in no acute  distress HEENT: Normal NECK: No JVD; No carotid bruits LYMPHATICS: No lymphadenopathy CARDIAC: RRR, no murmurs, rubs, gallops RESPIRATORY:  Clear to auscultation without rales, wheezing or rhonchi  ABDOMEN: Soft, non-tender, non-distended MUSCULOSKELETAL:  No edema; No deformity  SKIN: Warm and dry NEUROLOGIC:  Alert and oriented x 3 PSYCHIATRIC:  Normal affect    Signed, Shirlee More, MD  03/24/2023 1:17 PM    [facility]

## 2023-03-24 ENCOUNTER — Encounter: Payer: Self-pay | Admitting: Cardiology

## 2023-03-24 ENCOUNTER — Ambulatory Visit: Payer: Medicaid Other | Attending: Cardiology | Admitting: Cardiology

## 2023-03-24 VITALS — BP 134/86 | HR 86 | Ht 72.0 in | Wt 228.2 lb

## 2023-03-24 DIAGNOSIS — E7801 Familial hypercholesterolemia: Secondary | ICD-10-CM

## 2023-03-24 DIAGNOSIS — I2510 Atherosclerotic heart disease of native coronary artery without angina pectoris: Secondary | ICD-10-CM

## 2023-03-24 DIAGNOSIS — I10 Essential (primary) hypertension: Secondary | ICD-10-CM | POA: Diagnosis not present

## 2023-03-24 DIAGNOSIS — E7841 Elevated Lipoprotein(a): Secondary | ICD-10-CM

## 2023-03-24 DIAGNOSIS — E78019 Familial hypercholesterolemia, unspecified: Secondary | ICD-10-CM

## 2023-03-24 NOTE — Patient Instructions (Signed)
Medication Instructions:  Your physician recommends that you continue on your current medications as directed. Please refer to the Current Medication list given to you today.  *If you need a refill on your cardiac medications before your next appointment, please call your pharmacy*   Lab Work: Your physician recommends that you return for lab work in:   Labs today: CMP, Lipids  If you have labs (blood work) drawn today and your tests are completely normal, you will receive your results only by: MyChart Message (if you have MyChart) OR A paper copy in the mail If you have any lab test that is abnormal or we need to change your treatment, we will call you to review the results.   Testing/Procedures: None   Follow-Up: At [facility], you and your health needs are our priority.  As part of our continuing mission to provide you with exceptional heart care, we have created designated Provider Care Teams.  These Care Teams include your primary Cardiologist (physician) and Advanced Practice Providers (APPs -  Physician Assistants and Nurse Practitioners) who all work together to provide you with the care you need, when you need it.  We recommend signing up for the patient portal called "MyChart".  Sign up information is provided on this After Visit Summary.  MyChart is used to connect with patients for Virtual Visits (Telemedicine).  Patients are able to view lab/test results, encounter notes, upcoming appointments, etc.  Non-urgent messages can be sent to your provider as well.   To learn more about what you can do with MyChart, go to [email or web address].    Your next appointment:   6 month(s)  Provider:   Brian Munley, MD    Other Instructions None  

## 2023-03-25 LAB — LIPID PANEL
Chol/HDL Ratio: 1.8 ratio (ref 0.0–5.0)
Cholesterol, Total: 77 mg/dL — ABNORMAL LOW (ref 100–199)
HDL: 44 mg/dL (ref >39)
LDL Chol Calc (NIH): 17 mg/dL (ref 0–99)
Triglycerides: 76 mg/dL (ref 0–149)
VLDL Cholesterol Cal: 16 mg/dL (ref 5–40)

## 2023-03-25 LAB — COMPREHENSIVE METABOLIC PANEL
ALT: 41 IU/L (ref 0–44)
AST: 40 IU/L (ref 0–40)
Albumin/Globulin Ratio: 1 — ABNORMAL LOW (ref 1.2–2.2)
Albumin: 4.2 g/dL (ref 3.9–4.9)
Alkaline Phosphatase: 124 IU/L — ABNORMAL HIGH (ref 44–121)
BUN/Creatinine Ratio: 7 — ABNORMAL LOW (ref 10–24)
BUN: 11 mg/dL (ref 8–27)
Bilirubin Total: 0.3 mg/dL (ref 0.0–1.2)
CO2: 22 mmol/L (ref 20–29)
Calcium: 9.4 mg/dL (ref 8.6–10.2)
Chloride: 101 mmol/L (ref 96–106)
Creatinine, Ser: 1.61 mg/dL — ABNORMAL HIGH (ref 0.76–1.27)
Globulin, Total: 4.3 g/dL (ref 1.5–4.5)
Glucose: 92 mg/dL (ref 70–99)
Potassium: 5.3 mmol/L — ABNORMAL HIGH (ref 3.5–5.2)
Sodium: 139 mmol/L (ref 134–144)
Total Protein: 8.5 g/dL (ref 6.0–8.5)
eGFR: 48 mL/min/{1.73_m2} — ABNORMAL LOW (ref >59)

## 2023-04-03 ENCOUNTER — Other Ambulatory Visit: Payer: Self-pay

## 2023-04-03 ENCOUNTER — Encounter: Payer: Self-pay | Admitting: Pharmacist

## 2023-04-03 ENCOUNTER — Ambulatory Visit: Payer: Medicaid Other | Attending: Critical Care Medicine | Admitting: Pharmacist

## 2023-04-03 VITALS — BP 122/69

## 2023-04-03 DIAGNOSIS — I10 Essential (primary) hypertension: Secondary | ICD-10-CM

## 2023-04-03 NOTE — Progress Notes (Signed)
   S:     No chief complaint on file.  62 y.o. male who presents for hypertension evaluation, education, and management.  PMH is significant for HTN, CAD, PAD .  Patient was referred and last seen by Primary Care Provider, Dr. Joya Gaskins, on 02/26/2023. BP was 151/89 mmHg. Valsartan-HCTZ started. Of note, he was seen by Cardiology, Dr. Bettina Gavia, 03/24/2023 and BP was 134/86 mmHg at that visit. Labs obtained showed an increase in Scr and K but within acceptable limits and no changes were made to his medications.   Today, patient arrives in good spirits and presents without assistance.  Denies dizziness, headache, blurred vision, swelling.   Patient reports hypertension is longstanding.   Family/Social history:  Fhx: kidney failure Tobacco: never smoker  Alcohol: none  Medication adherence reported. Patient has taken BP medications today.   Current antihypertensives include: valsartan-HCTZ 80-12.5 mg daily   Antihypertensives tried in the past include: amlodipine, lisinopril   Reported home BP readings: none  Patient reported dietary habits:  -Compliant with sodium restriction.  -Denies drinking excessive amounts of caffeine   Patient-reported exercise habits:  -None reported  O:  Vitals:   04/03/23 1720  BP: 122/69   Last 3 Office BP readings: BP Readings from Last 3 Encounters:  04/03/23 122/69  03/24/23 134/86  02/26/23 (!) 151/89    BMET    Component Value Date/Time   NA 139 03/24/2023 1339   K 5.3 (H) 03/24/2023 1339   CL 101 03/24/2023 1339   CO2 22 03/24/2023 1339   GLUCOSE 92 03/24/2023 1339   GLUCOSE 98 11/19/2019 0723   BUN 11 03/24/2023 1339   CREATININE 1.61 (H) 03/24/2023 1339   CREATININE 0.96 08/24/2019 1040   CALCIUM 9.4 03/24/2023 1339   GFRNONAA 56 (L) 02/20/2021 1522   GFRAA 64 02/20/2021 1522    Renal function: Estimated Creatinine Clearance: 58.5 mL/min (A) (by C-G formula based on SCr of 1.61 mg/dL (H)).  Clinical ASCVD: Yes  The ASCVD  Risk score (Arnett DK, et al., 2019) failed to calculate for the following reasons:   The valid total cholesterol range is 130 to 320 mg/dL  Patient is participating in a Managed Medicaid Plan:  Yes    A/P: Hypertension longstanding currently at goal on current medications. BP goal < 130/80 mmHg. Medication adherence appears appropriate.  -Continued valsartan-HCTZ at current dose.  -Patient educated on purpose, proper use, and potential adverse effects of valsartan, HCTZ.  -F/u labs ordered - BMP8+eGFR -Counseled on lifestyle modifications for blood pressure control including reduced dietary sodium, increased exercise, adequate sleep. -Encouraged patient to check BP at home and bring log of readings to next visit. Counseled on proper use of home BP cuff.   Results reviewed and written information provided.    Written patient instructions provided. Patient verbalized understanding of treatment plan.  Total time in face to face counseling 30 minutes.    Follow-up:  PCP next month.  Benard Halsted, PharmD, [place], [place] [phone]

## 2023-04-08 ENCOUNTER — Telehealth: Payer: Self-pay

## 2023-04-08 NOTE — Telephone Encounter (Signed)
Copy of patients lab results returned undeliverable , patient mailing address incorrect

## 2023-04-11 NOTE — Telephone Encounter (Signed)
Last OV notes faxed to [facility]. Verified with Melissa that required information was in that OV note.

## 2023-04-11 NOTE — Telephone Encounter (Signed)
Travis Benson from [facility] stated she received a fax from the office with the order form and still needs the requested notes regarding the prosthesis.  Please advise.

## 2023-04-15 ENCOUNTER — Telehealth: Payer: Self-pay

## 2023-04-15 NOTE — Telephone Encounter (Signed)
Copied from CRM [id number]. Topic: General - Other >> Apr 15, 2023  9:47 AM Everette C wrote: Reason for CRM: Melissa with [facility] has called to follow up from 04/11/23 regarding a request for notes for prosthesis justification   Efraim Kaufmann has called to follow up on the request for MD notes to start th authorization process

## 2023-05-13 DIAGNOSIS — Z89611 Acquired absence of right leg above knee: Secondary | ICD-10-CM | POA: Diagnosis not present

## 2023-05-15 ENCOUNTER — Other Ambulatory Visit: Payer: Self-pay

## 2023-05-25 NOTE — Progress Notes (Unsigned)
Established Patient Office Visit  Subjective   Patient ID: Travis Benson, male    DOB: 05/31/1961  Age: 62 y.o. MRN: [id number]  No chief complaint on file.    02/26/23 Not seen since 11/2021 62 y.o.M HTN CAD PAD familial HLD Patient returns after having been to the lipid clinic and cardiology as documented below at the lipid clinic he was started on Repatha as below Lipid clinic 08/2022 Patient ID: Travis Benson                 DOB: 01/22/1961                    MRN: [id number]  HPI: Travis Benson is a 62 y.o. male patient referred to lipid clinic by Dr. Dulce Sellar. PMH is significant for hypertension, familiar hyperlipidemia with elevated LP(a) coronary calcium score on CT of 6.4, cardiac CTA showed mild CAD in the LAD and ramus 25 to 59% minimal in the right coronary artery left circumflex less than 25% and PAD. He has a baseline LDL-C of 230.   Current Medications: rosuvastatin 20mg  daily, ezetimibe 10mg  daily Intolerances: none Risk Factors: PAD, CAD, ASCVD risk score 30.1% LDL goal: <70 Cardiology visit with Dr. Dulce Sellar is as below Cards OV [place] Mild CAD   2. Familial hypercholesterolemia  3. Elevated Lp(a)  4. Essential hypertension    PLAN:    In order of problems listed above:   1. Stable very concerned about his lipid disorder inadequately treated recheck a lipid profile LP(a) today and refer to the lipid clinic to get access to PCSK9 therapy along with his high intensity statin. 2. Blood pressure not at target optimize treatment increase the dose of his ARB and add thiazide diuretic he has arrangements for follow-up with primary care   Today blood pressure remains elevated and he will need further adjustments in his blood pressure medication when he checks his blood pressure at home it is also high as well.  He needs a letter regarding his disability status and he needs liners for his prosthesis.  He needs follow-up lipids as well.  He needs refills on multiple  medications.  And he declined the flu vaccine   05/27/23        Review of Systems  Constitutional:  Negative for chills, diaphoresis, fever, malaise/fatigue and weight loss.  HENT:  Negative for congestion, hearing loss, nosebleeds, sore throat and tinnitus.   Eyes:  Negative for blurred vision, photophobia and redness.  Respiratory:  Negative for cough, hemoptysis, sputum production, shortness of breath, wheezing and stridor.   Cardiovascular:  Negative for chest pain, palpitations, orthopnea, claudication, leg swelling and PND.  Gastrointestinal:  Negative for abdominal pain, blood in stool, constipation, diarrhea, heartburn, nausea and vomiting.  Genitourinary:  Negative for dysuria, flank pain, frequency, hematuria and urgency.  Musculoskeletal:  Negative for back pain, falls, joint pain, myalgias and neck pain.  Skin:  Negative for itching and rash.  Neurological:  Negative for dizziness, tingling, tremors, sensory change, speech change, focal weakness, seizures, loss of consciousness, weakness and headaches.  Endo/Heme/Allergies:  Negative for environmental allergies and polydipsia. Does not bruise/bleed easily.  Psychiatric/Behavioral:  Negative for depression, memory loss, substance abuse and suicidal ideas. The patient is not nervous/anxious and does not have insomnia.       Objective:     There were no vitals taken for this visit.   Physical Exam Vitals reviewed.  Constitutional:      Appearance: Normal appearance. He  is well-developed. He is not diaphoretic.  HENT:     Head: Normocephalic and atraumatic.     Nose: No nasal deformity, septal deviation, mucosal edema or rhinorrhea.     Right Sinus: No maxillary sinus tenderness or frontal sinus tenderness.     Left Sinus: No maxillary sinus tenderness or frontal sinus tenderness.     Mouth/Throat:     Pharynx: No oropharyngeal exudate.  Eyes:     General: No scleral icterus.    Conjunctiva/sclera: Conjunctivae  normal.     Pupils: Pupils are equal, round, and reactive to light.  Neck:     Thyroid: No thyromegaly.     Vascular: No carotid bruit or JVD.     Trachea: Trachea normal. No tracheal tenderness or tracheal deviation.  Cardiovascular:     Rate and Rhythm: Normal rate and regular rhythm.     Chest Wall: PMI is not displaced.     Pulses: Normal pulses. No decreased pulses.     Heart sounds: Normal heart sounds, S1 normal and S2 normal. Heart sounds not distant. No murmur heard.    No systolic murmur is present.     No diastolic murmur is present.     No friction rub. No gallop. No S3 or S4 sounds.  Pulmonary:     Effort: No tachypnea, accessory muscle usage or respiratory distress.     Breath sounds: No stridor. No decreased breath sounds, wheezing, rhonchi or rales.  Chest:     Chest wall: No tenderness.  Abdominal:     General: Bowel sounds are normal. There is no distension.     Palpations: Abdomen is soft. Abdomen is not rigid.     Tenderness: There is no abdominal tenderness. There is no guarding or rebound.  Musculoskeletal:        General: Normal range of motion.     Cervical back: Normal range of motion and neck supple. No edema, erythema or rigidity. No muscular tenderness. Normal range of motion.     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     Left side of head: No submental or submandibular adenopathy.     Cervical: No cervical adenopathy.  Skin:    General: Skin is warm and dry.     Coloration: Skin is not pale.     Findings: No rash.     Nails: There is no clubbing.  Neurological:     Mental Status: He is alert and oriented to person, place, and time.     Sensory: No sensory deficit.  Psychiatric:        Speech: Speech normal.        Behavior: Behavior normal.      No results found for any visits on 05/27/23.    The ASCVD Risk score  (Arnett DK, et al., 2019) failed to calculate for the following reasons:   The valid total cholesterol range is 130 to 320 mg/dL    Assessment & Plan:   Problem List Items Addressed This Visit   None  No follow-ups on file.    Shan Levans, MD

## 2023-05-26 ENCOUNTER — Other Ambulatory Visit: Payer: Self-pay | Admitting: Critical Care Medicine

## 2023-05-27 ENCOUNTER — Other Ambulatory Visit: Payer: Self-pay

## 2023-05-27 ENCOUNTER — Ambulatory Visit: Payer: Medicaid Other | Attending: Critical Care Medicine | Admitting: Critical Care Medicine

## 2023-05-27 ENCOUNTER — Encounter: Payer: Self-pay | Admitting: Critical Care Medicine

## 2023-05-27 VITALS — BP 128/78 | HR 83 | Wt 236.6 lb

## 2023-05-27 DIAGNOSIS — I10 Essential (primary) hypertension: Secondary | ICD-10-CM | POA: Diagnosis not present

## 2023-05-27 DIAGNOSIS — Z89611 Acquired absence of right leg above knee: Secondary | ICD-10-CM

## 2023-05-27 DIAGNOSIS — I739 Peripheral vascular disease, unspecified: Secondary | ICD-10-CM | POA: Diagnosis not present

## 2023-05-27 DIAGNOSIS — E7801 Familial hypercholesterolemia: Secondary | ICD-10-CM

## 2023-05-27 DIAGNOSIS — K219 Gastro-esophageal reflux disease without esophagitis: Secondary | ICD-10-CM | POA: Diagnosis not present

## 2023-05-27 MED ORDER — PANTOPRAZOLE SODIUM 40 MG PO TBEC
40.0000 mg | DELAYED_RELEASE_TABLET | Freq: Every day | ORAL | 3 refills | Status: DC
  Filled 2023-05-27: qty 30, 30d supply, fill #0
  Filled 2023-05-28: qty 60, 60d supply, fill #0
  Filled 2023-08-10: qty 60, 60d supply, fill #1
  Filled 2023-11-12: qty 60, 60d supply, fill #2
  Filled 2024-02-06: qty 60, 60d supply, fill #3

## 2023-05-27 MED ORDER — EZETIMIBE 10 MG PO TABS
10.0000 mg | ORAL_TABLET | Freq: Every day | ORAL | 2 refills | Status: DC
  Filled 2023-05-27: qty 90, 90d supply, fill #0
  Filled 2023-09-04: qty 90, 90d supply, fill #1
  Filled 2023-12-02: qty 90, 90d supply, fill #2

## 2023-05-27 NOTE — Assessment & Plan Note (Signed)
Blood pressure well controlled no change in medication 

## 2023-05-27 NOTE — Patient Instructions (Signed)
No change in current medications, requested refills sent to pharmacy Start pantoprazole one daily 15 min before meal  No lab today  Return Dr Delford Field 4 months

## 2023-05-27 NOTE — Assessment & Plan Note (Signed)
Continue pantoprazole. °

## 2023-05-27 NOTE — Assessment & Plan Note (Signed)
Still has amputation

## 2023-05-27 NOTE — Assessment & Plan Note (Signed)
Stable at this time 

## 2023-05-27 NOTE — Assessment & Plan Note (Signed)
Cholesterol at goal no change in medicines

## 2023-05-28 ENCOUNTER — Other Ambulatory Visit: Payer: Self-pay

## 2023-06-13 ENCOUNTER — Other Ambulatory Visit: Payer: Self-pay

## 2023-07-11 ENCOUNTER — Other Ambulatory Visit: Payer: Self-pay

## 2023-07-17 ENCOUNTER — Other Ambulatory Visit: Payer: Self-pay

## 2023-08-18 ENCOUNTER — Other Ambulatory Visit: Payer: Self-pay

## 2023-08-29 ENCOUNTER — Telehealth: Payer: Self-pay

## 2023-08-29 ENCOUNTER — Other Ambulatory Visit (HOSPITAL_COMMUNITY): Payer: Self-pay

## 2023-08-29 NOTE — Telephone Encounter (Signed)
Pharmacy Patient Advocate Encounter  Received notification from [facility] that Prior Authorization for REPATHA has been APPROVED from 08/29/23 to 08/28/24. Ran test claim, Copay is $4. This test claim was processed through [facility]- copay amounts may vary at other pharmacies due to pharmacy/plan contracts, or as the patient moves through the different stages of their insurance plan.

## 2023-08-29 NOTE — Telephone Encounter (Signed)
Pharmacy Patient Advocate Encounter   Received notification from CoverMyMeds that prior authorization for REPATHA is required/requested.   Insurance verification completed.   The patient is insured through [facility] .   Per test claim: PA required; PA submitted to [facility] via CoverMyMeds Key/confirmation #/[id number] Status is pending

## 2023-09-05 ENCOUNTER — Other Ambulatory Visit: Payer: Self-pay

## 2023-09-30 ENCOUNTER — Other Ambulatory Visit: Payer: Self-pay | Admitting: Cardiology

## 2023-10-01 ENCOUNTER — Other Ambulatory Visit: Payer: Self-pay

## 2023-10-01 MED ORDER — REPATHA 140 MG/ML ~~LOC~~ SOSY
140.0000 mg | PREFILLED_SYRINGE | SUBCUTANEOUS | 0 refills | Status: DC
  Filled 2023-10-01: qty 2, 28d supply, fill #0
  Filled 2023-11-12: qty 2, 28d supply, fill #1
  Filled 2023-12-02 – 2023-12-12 (×2): qty 2, 28d supply, fill #2

## 2023-10-06 ENCOUNTER — Other Ambulatory Visit: Payer: Self-pay

## 2023-10-13 DIAGNOSIS — M545 Low back pain, unspecified: Secondary | ICD-10-CM | POA: Diagnosis not present

## 2023-10-13 DIAGNOSIS — M79604 Pain in right leg: Secondary | ICD-10-CM | POA: Diagnosis not present

## 2023-11-18 ENCOUNTER — Other Ambulatory Visit: Payer: Self-pay

## 2023-12-02 ENCOUNTER — Other Ambulatory Visit: Payer: Self-pay

## 2023-12-03 ENCOUNTER — Other Ambulatory Visit: Payer: Self-pay

## 2023-12-12 ENCOUNTER — Other Ambulatory Visit: Payer: Self-pay

## 2024-01-12 ENCOUNTER — Other Ambulatory Visit: Payer: Self-pay

## 2024-01-12 ENCOUNTER — Other Ambulatory Visit: Payer: Self-pay | Admitting: Cardiology

## 2024-01-12 MED ORDER — REPATHA 140 MG/ML ~~LOC~~ SOSY
140.0000 mg | PREFILLED_SYRINGE | SUBCUTANEOUS | 1 refills | Status: DC
  Filled 2024-01-12: qty 2, 28d supply, fill #0
  Filled 2024-02-06: qty 2, 28d supply, fill #1

## 2024-01-14 ENCOUNTER — Other Ambulatory Visit: Payer: Self-pay

## 2024-02-13 ENCOUNTER — Other Ambulatory Visit: Payer: Self-pay

## 2024-02-19 DIAGNOSIS — Z89611 Acquired absence of right leg above knee: Secondary | ICD-10-CM | POA: Diagnosis not present

## 2024-03-09 ENCOUNTER — Other Ambulatory Visit: Payer: Self-pay

## 2024-03-09 ENCOUNTER — Other Ambulatory Visit: Payer: Self-pay | Admitting: Critical Care Medicine

## 2024-03-09 ENCOUNTER — Other Ambulatory Visit: Payer: Self-pay | Admitting: Cardiology

## 2024-03-09 MED ORDER — REPATHA 140 MG/ML ~~LOC~~ SOSY
140.0000 mg | PREFILLED_SYRINGE | SUBCUTANEOUS | 0 refills | Status: DC
Start: 2024-03-09 — End: 2024-05-06
  Filled 2024-03-09 – 2024-04-02 (×2): qty 2, 28d supply, fill #0

## 2024-03-10 ENCOUNTER — Other Ambulatory Visit: Payer: Self-pay | Admitting: Critical Care Medicine

## 2024-03-11 NOTE — Telephone Encounter (Signed)
 Unable to refill per protocol, appointment needed.   Requested Prescriptions  Pending Prescriptions Disp Refills   rosuvastatin (CRESTOR) 20 MG tablet 90 tablet 3    Sig: Take 1 tablet (20 mg total) by mouth daily.     Cardiovascular:  Antilipid - Statins 2 Failed - 03/11/2024 11:18 AM      Failed - Cr in normal range and within 360 days    Creat  Date Value Ref Range Status  08/24/2019 0.96 0.70 - 1.33 mg/dL Final    Comment:    For patients >65 years of age, the reference limit for Creatinine is approximately 13% higher for people identified as African-American. .    Creatinine, Ser  Date Value Ref Range Status  03/24/2023 1.61 (H) 0.76 - 1.27 mg/dL Final         Failed - Lipid Panel in normal range within the last 12 months    Cholesterol, Total  Date Value Ref Range Status  03/24/2023 77 (L) 100 - 199 mg/dL Final   LDL Chol Calc (NIH)  Date Value Ref Range Status  03/24/2023 17 0 - 99 mg/dL Final   HDL  Date Value Ref Range Status  03/24/2023 44 >39 mg/dL Final   Triglycerides  Date Value Ref Range Status  03/24/2023 76 0 - 149 mg/dL Final         Passed - Patient is not pregnant      Passed - Valid encounter within last 12 months    Recent Outpatient Visits           9 months ago Primary hypertension   [facility] [place]. [facility] Storm Frisk, MD   11 months ago Essential hypertension   [facility] [place]. [facility] Drucilla Chalet, RPH-CPP   1 year ago Primary hypertension   [facility] [place] [facility] [place]. [facility] Storm Frisk, MD   2 years ago Primary hypertension   [facility] [place] [facility] [place]. [facility] Storm Frisk, MD   2 years ago Need for immunization against influenza   [facility] [place]. [facility], Marylene Land  [place], [place]               valsartan-hydrochlorothiazide (DIOVAN-HCT) 80-12.5 MG tablet 90 tablet 3    Sig: Take 1 tablet by mouth daily.     Cardiovascular: ARB + Diuretic Combos Failed - 03/11/2024 11:18 AM      Failed - K in normal range and within 180 days    Potassium  Date Value Ref Range Status  03/24/2023 5.3 (H) 3.5 - 5.2 mmol/L Final         Failed - Na in normal range and within 180 days    Sodium  Date Value Ref Range Status  03/24/2023 139 134 - 144 mmol/L Final         Failed - Cr in normal range and within 180 days    Creat  Date Value Ref Range Status  08/24/2019 0.96 0.70 - 1.33 mg/dL Final    Comment:    For patients >47 years of age, the reference limit for Creatinine is approximately 13% higher for people identified as African-American. .    Creatinine, Ser  Date Value Ref Range Status  03/24/2023 1.61 (H) 0.76 - 1.27 mg/dL Final         Failed - eGFR is 10 or above and within 180 days    GFR calc Af Amer  Date Value Ref Range Status  02/20/2021 64 >59 mL/min/1.73 Final    Comment:    **In accordance with recommendations from the NKF-ASN Task force,**   [facility] is in the process of updating its eGFR calculation to the   2021 CKD-EPI creatinine equation that estimates kidney function   without a race variable.    GFR calc non Af Amer  Date Value Ref Range Status  02/20/2021 56 (L) >59 mL/min/1.73 Final   eGFR  Date Value Ref Range Status  03/24/2023 48 (L) >59 mL/min/1.73 Final         Failed - Valid encounter within last 6 months    Recent Outpatient Visits           9 months ago Primary hypertension   [facility] [place]. [facility] Storm Frisk, MD   11 months ago Essential hypertension   [facility] [place] [facility] [place]. [facility] Drucilla Chalet, RPH-CPP   1 year ago Primary hypertension   [facility] [place] [facility] [place].  [facility] Storm Frisk, MD   2 years ago Primary hypertension   [facility] [place] [facility] [place]. [facility] Storm Frisk, MD   2 years ago Need for immunization against influenza   [facility] - [facility] Dept Of [place]. [facility] [place], [place], [place]              Passed - Patient is not pregnant      Passed - Last BP in normal range    BP Readings from Last 1 Encounters:  05/27/23 128/78          ezetimibe (ZETIA) 10 MG tablet 90 tablet 2    Sig: Take 1 tablet (10 mg total) by mouth daily.     Cardiovascular:  Antilipid - Sterol Transport Inhibitors Failed - 03/11/2024 11:18 AM      Failed - Lipid Panel in normal range within the last 12 months    Cholesterol, Total  Date Value Ref Range Status  03/24/2023 77 (L) 100 - 199 mg/dL Final   LDL Chol Calc (NIH)  Date Value Ref Range Status  03/24/2023 17 0 - 99 mg/dL Final   HDL  Date Value Ref Range Status  03/24/2023 44 >39 mg/dL Final   Triglycerides  Date Value Ref Range Status  03/24/2023 76 0 - 149 mg/dL Final         Passed - AST in normal range and within 360 days    AST  Date Value Ref Range Status  03/24/2023 40 0 - 40 IU/L Final         Passed - ALT in normal range and within 360 days    ALT  Date Value Ref Range Status  03/24/2023 41 0 - 44 IU/L Final  08/24/2019 38 9 - 46 U/L Final         Passed - Patient is not pregnant      Passed - Valid encounter within last 12 months    Recent Outpatient Visits  9 months ago Primary hypertension   [facility] [place] [facility] [place]. [facility] Storm Frisk, MD   11 months ago Essential hypertension   [place] [facility] [place] [facility] [place]. [facility] Drucilla Chalet, RPH-CPP   1 year ago Primary hypertension   [place] [facility] [place] [facility] Of [place]. [facility] Storm Frisk, MD   2 years ago Primary hypertension   [place] [facility] [place] - [facility] Dept Of [place]. [facility] Storm Frisk, MD   2 years ago Need for immunization against influenza   [facility] - A Dept Of [place]. [facility], [place], [place]

## 2024-03-12 ENCOUNTER — Other Ambulatory Visit: Payer: Self-pay

## 2024-03-18 ENCOUNTER — Other Ambulatory Visit: Payer: Self-pay

## 2024-04-02 ENCOUNTER — Other Ambulatory Visit: Payer: Self-pay

## 2024-04-02 ENCOUNTER — Other Ambulatory Visit: Payer: Self-pay | Admitting: Critical Care Medicine

## 2024-04-02 NOTE — Telephone Encounter (Signed)
 Copied from CRM [id number]. Topic: Clinical - Medication Refill >> Apr 02, 2024  3:43 PM Clayton Bibles wrote: Most Recent Primary Care Visit:  Provider: Storm Frisk  [facility]  Visit Type: OFFICE VISIT  Date: 05/27/2023  Medication: 90 Day Supplies for valsartan-hydrochlorothiazide (DIOVAN-HCT) 80-12.5 MG tablet AND rosuvastatin (CRESTOR) 20 MG tablet AND ezetimibe (ZETIA) 10 MG tablet [[id number]]  Has the patient contacted their pharmacy? Yes (Agent: If no, request that the patient contact the pharmacy for the refill. If patient does not wish to contact the pharmacy document the reason why and proceed with request.) (Agent: If yes, when and what did the pharmacy advise?) Pharmacy needs orders to refill  Is this the correct pharmacy for this prescription? Yes If no, delete pharmacy and type the correct one.  This is the patient's preferred pharmacy:  [facility] [address] [place] [address] Phone: [phone] Fax: [phone]  Has the prescription been filled recently? No  Is the patient out of the medication? No - He has 4 days left for all 3 medications to take  Has the patient been seen for an appointment in the last year OR does the patient have an upcoming appointment? Yes  Can we respond through MyChart? Yes  Agent: Please be advised that Rx refills may take up to 3 business days. We ask that you follow-up with your pharmacy.

## 2024-04-06 ENCOUNTER — Other Ambulatory Visit: Payer: Self-pay

## 2024-04-07 ENCOUNTER — Telehealth: Payer: Self-pay

## 2024-04-07 DIAGNOSIS — I10 Essential (primary) hypertension: Secondary | ICD-10-CM

## 2024-04-07 NOTE — Patient Outreach (Signed)
 Submitted email referral for assistance.   [facility]  Population Health Care Management Assistant  Direct Dial: [phone]  Fax: [phone] Website: [email or web address]

## 2024-04-08 ENCOUNTER — Telehealth: Payer: Self-pay | Admitting: *Deleted

## 2024-04-08 NOTE — Progress Notes (Signed)
 Complex Care Management Note  Care Guide Note 04/08/2024 Name: Travis Benson MRN: [id number] DOB: [birth year]  Travis Benson is a 63 y.o. year old male who sees Storm Frisk, MD for primary care. I reached out to [employer] by phone today to offer complex care management services.  Mr. Trickey was given information about Complex Care Management services today including:   The Complex Care Management services include support from the care team which includes your Nurse Care Manager, Clinical Social Worker, or Pharmacist.  The Complex Care Management team is here to help remove barriers to the health concerns and goals most important to you. Complex Care Management services are voluntary, and the patient may decline or stop services at any time by request to their care team member.   Complex Care Management Consent Status: Patient agreed to services and verbal consent obtained.   Follow up plan:  Telephone appointment with complex care management team member scheduled for:  5/8  Encounter Outcome:  Patient Scheduled  Gwenevere Ghazi  [facility]  [facility], [facility] Guide  Direct Dial: [phone]  Fax [phone]

## 2024-04-12 NOTE — Progress Notes (Signed)
 Complete physical exam  Patient: Travis Benson   DOB: 16-Apr-1961   63 y.o. Male  MRN: [id number]  Subjective:    Chief Complaint  Patient presents with   Annual Exam    Travis Benson is a 63 y.o. male who presents today for a complete physical exam. He reports consuming a general diet. Gym/ health club routine includes light weights and low impact aerobics. He generally feels well. He reports incontinence and urinary stream reduction.  He has never had evaluation for prostatic hypertrophy.  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  His daughter assists him and needs family leave paperwork filled out.  The patient complains of blurred vision.  Patient has dental challenges as well.  Patient with history of familial hyper lipidemia followed by cardiology on Repatha  needs a repeat referral.  Follows blood pressure at home has been normal.  Here in the office on repeat is satisfactory  He does no additional problems to discuss today.  He is on medication for blood pressure  Patient has peripheral artery disease for which he is on aspirin  Zetia  and Repatha  and rosuvastatin   Most recent fall risk assessment:    04/15/2024    3:27 PM  Fall Risk   Falls in the past year? 0  Number falls in past yr: 0  Injury with Fall? 0  Risk for fall due to : No Fall Risks  Follow up Falls evaluation completed     Most recent depression screenings:    04/15/2024    3:27 PM 05/27/2023    2:57 PM  PHQ 2/9 Scores  PHQ - 2 Score 0 0  PHQ- 9 Score 0 0        Patient Care Team: Vernell Goldsmith, MD as PCP - General (Pulmonary Disease) Patient, No Pcp Per (General Practice) Hardy Lia, MD as Consulting Physician (Orthopedic Surgery)   Outpatient Medications Prior to Visit  Medication Sig   Blood Pressure Monitor DEVI Use to check blood pressure daily   Evolocumab  (REPATHA ) 140 MG/ML SOSY Inject 140 mg into the skin every 14 (fourteen) days. 2nd  attempt, patient needs an appt for additional refills   Misc. Devices MISC Single Loftstrand crutches.  Diagnosis unstable gait.   [DISCONTINUED] ezetimibe  (ZETIA ) 10 MG tablet Take 1 tablet (10 mg total) by mouth daily.   [DISCONTINUED] pantoprazole  (PROTONIX ) 40 MG tablet Take 1 tablet (40 mg total) by mouth daily.   [DISCONTINUED] rosuvastatin  (CRESTOR ) 20 MG tablet Take 1 tablet (20 mg total) by mouth daily.   [DISCONTINUED] valsartan -hydrochlorothiazide  (DIOVAN -HCT) 80-12.5 MG tablet Take 1 tablet by mouth daily.   aspirin  EC 81 MG EC tablet Take 1 tablet (81 mg total) by mouth daily. (Patient not taking: Reported on 04/15/2024)   No facility-administered medications prior to visit.    Review of Systems  Constitutional:  Negative for chills, diaphoresis, fever, malaise/fatigue and weight loss.  HENT:  Positive for hearing loss. Negative for congestion, nosebleeds, sore throat and tinnitus.   Eyes:  Positive for blurred vision. Negative for photophobia and redness.  Respiratory:  Positive for cough. Negative for hemoptysis, sputum production, shortness of breath, wheezing and stridor.   Cardiovascular:  Negative for chest pain, palpitations, orthopnea, claudication, leg swelling and PND.  Gastrointestinal:  Negative for abdominal pain, blood in stool, constipation, diarrhea, heartburn, nausea and vomiting.  Genitourinary:  Positive for frequency and urgency. Negative for dysuria, flank pain and hematuria.  Incontinence  Musculoskeletal:  Negative for back pain, falls, joint pain, myalgias and neck pain.  Skin:  Negative for itching and rash.  Neurological:  Positive for dizziness. Negative for tingling, tremors, sensory change, speech change, focal weakness, seizures, loss of consciousness, weakness and headaches.  Endo/Heme/Allergies:  Negative for environmental allergies and polydipsia. Does not bruise/bleed easily.  Psychiatric/Behavioral:  Negative for depression, memory loss,  substance abuse and suicidal ideas. The patient is not nervous/anxious and does not have insomnia.           Objective:     BP 132/77 (BP Location: Left Arm, Patient Position: Sitting, Cuff Size: Large)   Pulse 76   Resp 19   Ht 6' (1.829 m)   Wt 249 lb (112.9 kg)   SpO2 100%   BMI 33.77 kg/m    Physical Exam   Results for orders placed or performed in visit on 04/15/24  PSA  Result Value Ref Range   Prostate Specific Ag, Serum 1.6 0.0 - 4.0 ng/mL  Comprehensive metabolic panel with GFR  Result Value Ref Range   Glucose 92 70 - 99 mg/dL   BUN 13 8 - 27 mg/dL   Creatinine, Ser 1.91 (H) 0.76 - 1.27 mg/dL   eGFR 62 >47 WG/NFA/2.13   BUN/Creatinine Ratio 10 10 - 24   Sodium 139 134 - 144 mmol/L   Potassium 4.3 3.5 - 5.2 mmol/L   Chloride 103 96 - 106 mmol/L   CO2 21 20 - 29 mmol/L   Calcium  9.6 8.6 - 10.2 mg/dL   Total Protein 7.5 6.0 - 8.5 g/dL   Albumin  4.2 3.9 - 4.9 g/dL   Globulin, Total 3.3 1.5 - 4.5 g/dL   Bilirubin Total 0.3 0.0 - 1.2 mg/dL   Alkaline Phosphatase 144 (H) 44 - 121 IU/L   AST 24 0 - 40 IU/L   ALT 19 0 - 44 IU/L  Urinalysis  Result Value Ref Range   Specific Gravity, UA 1.019 1.005 - 1.030   pH, UA 5.5 5.0 - 7.5   Color, UA Yellow Yellow   Appearance Ur Clear Clear   Leukocytes,UA Negative Negative   Protein,UA Negative Negative/Trace   Glucose, UA Negative Negative   Ketones, UA Negative Negative   RBC, UA Negative Negative   Bilirubin, UA Negative Negative   Urobilinogen, Ur 0.2 0.2 - 1.0 mg/dL   Nitrite, UA Negative Negative       Assessment & Plan:    Routine Health Maintenance and Physical Exam  Immunization History  Administered Date(s) Administered   Influenza,inj,Quad PF,6+ Mos 09/26/2021   PNEUMOCOCCAL CONJUGATE-20 12/26/2021   Tdap 02/28/2019    Health Maintenance  Topic Date Due   Zoster Vaccines- Shingrix (1 of 2) Never done   COVID-19 Vaccine (1 - 2024-25 season) Never done   INFLUENZA VACCINE  07/30/2024    Fecal DNA (Cologuard)  01/08/2025   DTaP/Tdap/Td (2 - Td or Tdap) 02/27/2029   Pneumococcal Vaccine 55-35 Years old  Completed   Hepatitis C Screening  Completed   HIV Screening  Completed   HPV VACCINES  Aged Out   Meningococcal B Vaccine  Aged Out    Discussed health benefits of physical activity, and encouraged him to engage in regular exercise appropriate for his age and condition.  Problem List Items Addressed This Visit       Cardiovascular and Mediastinum   Hypertension   Hypertension well-controlled continue current dosing of valsartan  HCT 80/12.5 Metabolic panel returned very mild chronic  kidney disease      Relevant Medications   ezetimibe  (ZETIA ) 10 MG tablet   rosuvastatin  (CRESTOR ) 20 MG tablet   Other Relevant Orders   Comprehensive metabolic panel with GFR (Completed)   Coronary artery disease   Relevant Medications   ezetimibe  (ZETIA ) 10 MG tablet   rosuvastatin  (CRESTOR ) 20 MG tablet   Other Relevant Orders   Ambulatory referral to Cardiology   PAD (peripheral artery disease) (HCC)   Referral back to cardiology and continue cholesterol management      Relevant Medications   ezetimibe  (ZETIA ) 10 MG tablet   rosuvastatin  (CRESTOR ) 20 MG tablet     Other   Familial hypercholesterolemia   Relevant Medications   ezetimibe  (ZETIA ) 10 MG tablet   rosuvastatin  (CRESTOR ) 20 MG tablet   Other Relevant Orders   Ambulatory referral to Cardiology   Urinary incontinence - Primary   Urinalysis returned as normal PSA normal we will treat for BPH with tamsulosin       Relevant Medications   tamsulosin  (FLOMAX ) 0.4 MG CAPS capsule   Other Relevant Orders   PSA (Completed)   Urinalysis (Completed)   History of dental problems   Referral to dentistry      Relevant Orders   Ambulatory referral to Dentistry   Blurred vision   Referral to ophthalmology      Relevant Orders   Ambulatory referral to Ophthalmology   Return in about 6 months (around  10/15/2024) for primary care follow up, htn.     Arlene Lacy, MD

## 2024-04-13 ENCOUNTER — Other Ambulatory Visit: Payer: Self-pay

## 2024-04-15 ENCOUNTER — Ambulatory Visit: Attending: Critical Care Medicine | Admitting: Critical Care Medicine

## 2024-04-15 ENCOUNTER — Other Ambulatory Visit: Payer: Self-pay

## 2024-04-15 ENCOUNTER — Encounter: Payer: Self-pay | Admitting: Critical Care Medicine

## 2024-04-15 VITALS — BP 132/77 | HR 76 | Resp 19 | Ht 72.0 in | Wt 249.0 lb

## 2024-04-15 DIAGNOSIS — I2510 Atherosclerotic heart disease of native coronary artery without angina pectoris: Secondary | ICD-10-CM

## 2024-04-15 DIAGNOSIS — I739 Peripheral vascular disease, unspecified: Secondary | ICD-10-CM | POA: Diagnosis not present

## 2024-04-15 DIAGNOSIS — H538 Other visual disturbances: Secondary | ICD-10-CM | POA: Diagnosis not present

## 2024-04-15 DIAGNOSIS — E7801 Familial hypercholesterolemia: Secondary | ICD-10-CM | POA: Diagnosis not present

## 2024-04-15 DIAGNOSIS — R32 Unspecified urinary incontinence: Secondary | ICD-10-CM | POA: Diagnosis not present

## 2024-04-15 DIAGNOSIS — Z8719 Personal history of other diseases of the digestive system: Secondary | ICD-10-CM

## 2024-04-15 DIAGNOSIS — I10 Essential (primary) hypertension: Secondary | ICD-10-CM

## 2024-04-15 MED ORDER — TAMSULOSIN HCL 0.4 MG PO CAPS
0.4000 mg | ORAL_CAPSULE | Freq: Every day | ORAL | 3 refills | Status: AC
  Filled 2024-04-15: qty 30, 30d supply, fill #0
  Filled 2024-06-01: qty 30, 30d supply, fill #1

## 2024-04-15 MED ORDER — VALSARTAN-HYDROCHLOROTHIAZIDE 160-25 MG PO TABS
1.0000 | ORAL_TABLET | Freq: Every day | ORAL | 3 refills | Status: DC
  Filled 2024-04-15: qty 90, 90d supply, fill #0

## 2024-04-15 MED ORDER — ROSUVASTATIN CALCIUM 20 MG PO TABS
20.0000 mg | ORAL_TABLET | Freq: Every day | ORAL | 3 refills | Status: AC
  Filled 2024-04-15: qty 90, 90d supply, fill #0

## 2024-04-15 MED ORDER — PANTOPRAZOLE SODIUM 40 MG PO TBEC
40.0000 mg | DELAYED_RELEASE_TABLET | Freq: Every day | ORAL | 3 refills | Status: AC
  Filled 2024-04-15: qty 60, 60d supply, fill #0

## 2024-04-15 MED ORDER — EZETIMIBE 10 MG PO TABS
10.0000 mg | ORAL_TABLET | Freq: Every day | ORAL | 2 refills | Status: AC
  Filled 2024-04-15: qty 90, 90d supply, fill #0

## 2024-04-15 NOTE — Patient Instructions (Signed)
 Referral back to cardiology made also to eye and dental provider Increase valsartan HCT to 160/25 daily from current dose prescription sent Labs today including urine study and prostate cancer screen Start tamsulosin daily for bladder issues Return for blood pressure recheck with our clinical pharmacist 4 weeks and return in 6 months for primary care reassessment

## 2024-04-16 ENCOUNTER — Other Ambulatory Visit: Payer: Self-pay

## 2024-04-16 ENCOUNTER — Telehealth: Payer: Self-pay | Admitting: Critical Care Medicine

## 2024-04-16 DIAGNOSIS — Z8719 Personal history of other diseases of the digestive system: Secondary | ICD-10-CM | POA: Insufficient documentation

## 2024-04-16 DIAGNOSIS — H538 Other visual disturbances: Secondary | ICD-10-CM | POA: Insufficient documentation

## 2024-04-16 DIAGNOSIS — R32 Unspecified urinary incontinence: Secondary | ICD-10-CM | POA: Insufficient documentation

## 2024-04-16 LAB — PSA: Prostate Specific Ag, Serum: 1.6 ng/mL (ref 0.0–4.0)

## 2024-04-16 LAB — COMPREHENSIVE METABOLIC PANEL WITH GFR
ALT: 19 IU/L (ref 0–44)
AST: 24 IU/L (ref 0–40)
Albumin: 4.2 g/dL (ref 3.9–4.9)
Alkaline Phosphatase: 144 IU/L — ABNORMAL HIGH (ref 44–121)
BUN/Creatinine Ratio: 10 (ref 10–24)
BUN: 13 mg/dL (ref 8–27)
Bilirubin Total: 0.3 mg/dL (ref 0.0–1.2)
CO2: 21 mmol/L (ref 20–29)
Calcium: 9.6 mg/dL (ref 8.6–10.2)
Chloride: 103 mmol/L (ref 96–106)
Creatinine, Ser: 1.29 mg/dL — ABNORMAL HIGH (ref 0.76–1.27)
Globulin, Total: 3.3 g/dL (ref 1.5–4.5)
Glucose: 92 mg/dL (ref 70–99)
Potassium: 4.3 mmol/L (ref 3.5–5.2)
Sodium: 139 mmol/L (ref 134–144)
Total Protein: 7.5 g/dL (ref 6.0–8.5)
eGFR: 62 mL/min/{1.73_m2} (ref >59)

## 2024-04-16 LAB — URINALYSIS
Bilirubin, UA: NEGATIVE
Glucose, UA: NEGATIVE
Ketones, UA: NEGATIVE
Leukocytes,UA: NEGATIVE
Nitrite, UA: NEGATIVE
Protein,UA: NEGATIVE
RBC, UA: NEGATIVE
Specific Gravity, UA: 1.019 (ref 1.005–1.030)
Urobilinogen, Ur: 0.2 mg/dL (ref 0.2–1.0)
pH, UA: 5.5 (ref 5.0–7.5)

## 2024-04-16 MED ORDER — VALSARTAN-HYDROCHLOROTHIAZIDE 80-12.5 MG PO TABS
1.0000 | ORAL_TABLET | Freq: Every day | ORAL | 3 refills | Status: AC
  Filled 2024-04-16: qty 90, 90d supply, fill #0

## 2024-04-16 NOTE — Assessment & Plan Note (Signed)
Referral to dentistry 

## 2024-04-16 NOTE — Assessment & Plan Note (Signed)
 Referral back to cardiology and continue cholesterol management

## 2024-04-16 NOTE — Assessment & Plan Note (Addendum)
 Hypertension well-controlled continue current dosing of valsartan  HCT 80/12.5 Metabolic panel returned very mild chronic kidney disease

## 2024-04-16 NOTE — Telephone Encounter (Signed)
 Call this patient today and tell him to stay on his current dose of blood pressure medicine there was an elevated reading but I did not realize you rechecked it and it was normal I had increased his blood pressure medicine but this is not necessary  I will send a renewed prescription for his blood pressure medicine at the original dose and if you could call the pharmacy downstairs as well and tell them not to issue the 160/25 mg valsartan  HCT he is to stay on the original dose

## 2024-04-16 NOTE — Assessment & Plan Note (Signed)
 Referral to ophthalmology

## 2024-04-16 NOTE — Progress Notes (Signed)
 Let patient know all of his labs are normal he does not have prostate cancer likely has an enlarged prostate take medication for the bladder as prescribed urine is clear

## 2024-04-16 NOTE — Assessment & Plan Note (Signed)
 Urinalysis returned as normal PSA normal we will treat for BPH with tamsulosin 

## 2024-04-19 NOTE — Telephone Encounter (Signed)
 Patient identified by name and date of birth.  Patient has be made aware of error and will not take the new bp medication. To late to inform pharmacy as patient picked up new rx right after appointment.

## 2024-04-20 ENCOUNTER — Other Ambulatory Visit: Payer: Self-pay

## 2024-04-21 ENCOUNTER — Other Ambulatory Visit: Payer: Self-pay

## 2024-04-21 ENCOUNTER — Telehealth: Payer: Self-pay | Admitting: Critical Care Medicine

## 2024-04-21 NOTE — Telephone Encounter (Signed)
 FYI Copied from CRM [id number]. Topic: General - Other  >> Apr 21, 2024  2:01 PM Star [place] wrote:  Reason for CRM: daughter Renee Beale will pick up FMLA paperwork when she gets out of work today

## 2024-05-06 ENCOUNTER — Other Ambulatory Visit: Payer: Self-pay

## 2024-05-06 ENCOUNTER — Other Ambulatory Visit: Payer: Self-pay | Admitting: Cardiology

## 2024-05-06 ENCOUNTER — Encounter (HOSPITAL_COMMUNITY): Payer: Self-pay

## 2024-05-06 VITALS — BP 131/77

## 2024-05-06 DIAGNOSIS — Z139 Encounter for screening, unspecified: Secondary | ICD-10-CM

## 2024-05-06 DIAGNOSIS — I2510 Atherosclerotic heart disease of native coronary artery without angina pectoris: Secondary | ICD-10-CM

## 2024-05-06 DIAGNOSIS — E7841 Elevated Lipoprotein(a): Secondary | ICD-10-CM

## 2024-05-06 DIAGNOSIS — E7801 Familial hypercholesterolemia: Secondary | ICD-10-CM

## 2024-05-06 MED ORDER — REPATHA SURECLICK 140 MG/ML ~~LOC~~ SOAJ
1.0000 mL | SUBCUTANEOUS | 0 refills | Status: AC
  Filled 2024-05-06: qty 2, 28d supply, fill #0
  Filled 2024-06-01: qty 2, 28d supply, fill #1

## 2024-05-07 ENCOUNTER — Other Ambulatory Visit: Payer: Self-pay

## 2024-05-07 NOTE — Patient Instructions (Signed)
 Visit Information  Travis Benson was given information about Medicaid Managed Care team care coordination services as a part of their [facility] Community Plan Medicaid benefit. Travis Benson verbally consented to engagement with the [facility] Managed Care team.   If you are experiencing a medical emergency, please call 911 or report to your local emergency department or urgent care.   If you have a non-emergency medical problem during routine business hours, please contact your provider's office and ask to speak with a nurse.   For questions related to your [facility], please call: [phone] or visit the homepage here: [email or web address]  If you would like to schedule transportation through your [facility], please call the following number at least 2 days in advance of your appointment: [phone]   Rides for urgent appointments can also be made after hours by calling Member Services.  Call the Behavioral Health Crisis Line at [phone], at any time, 24 hours a day, 7 days a week. If you are in danger or need immediate medical attention call 911.  If you would like help to quit smoking, call 1-800-QUIT-NOW (225-202-1815) OR Espaol: 1-855-Djelo-Ya (6-440-347-4259) o para ms informacin haga clic aqu or Text READY to 563-875 to register via text  Travis Benson - following are the goals we discussed in your visit today:   Goals Addressed             This Visit's Progress    VBCI RN Care Plan   On track    Problems:  Care Coordination needs related to Financial Strain  Chronic Disease Management support and education needs related to HTN and history of R AKA No Advanced Directives in place  Goal: Over the next 30 days the Patient will collaborate with the care management team towards completion of advanced directives   as evidenced by completed directives uploaded to  EMR  demonstrate Ongoing adherence to prescribed treatment plan for HTN as evidenced by reported home readings below goal of <140/90 take all medications exactly as prescribed and will call provider for medication related questions as evidenced by medication adherence    Schedule dental and ophthalmology appointments as evidenced by patient report  Interventions:   Evaluation of current treatment plan related to HTN and history of R AKA, Financial constraints related to housing and food self-management and patient's adherence to plan as established by provider. Discussed plans with patient for ongoing care management follow up and provided patient with direct contact information for care management team Advised patient to continue to consider surgery for rotator cuff if he feels it is limiting his every day activities. Discussed exploring options for personal care services through his insurance to help with ADLs during surgery recovery if needed  Social Work referral for assistance creating advanced directives, possible personal care services if he gets surgery, and SDOH needs Discussed plans with patient for ongoing care management follow up and provided patient with direct contact information for care management team Call placed to dental provider where referral was sent ([facility]). Awaiting return call Call placed to ophthalmologist provider where referral was sent (formerly known as [facility])   Hypertension Interventions: Last practice recorded BP readings:  BP Readings from Last 3 Encounters:  05/06/24 131/77  04/15/24 132/77  05/27/23 128/78   Most recent eGFR/CrCl:  Lab Results  Component Value Date   EGFR 62 04/15/2024    No components found for: "CRCL"  Evaluation of current treatment plan  related to hypertension self management and patient's adherence to plan as established by provider Reviewed medications with patient and discussed importance  of compliance Discussed plans with patient for ongoing care management follow up and provided patient with direct contact information for care management team Advised patient, providing education and rationale, to monitor blood pressure daily and record, calling PCP for findings outside established parameters  Patient Self-Care Activities:  Attend all scheduled provider appointments Call provider office for new concerns or questions  Perform all self care activities independently  Take medications as prescribed   Work with the social worker to address care coordination needs and will continue to work with the clinical team to address health care and disease management related needs check blood pressure 3 times per week keep a blood pressure log take blood pressure log to all doctor appointments take medications for blood pressure exactly as prescribed  Plan:  Telephone follow up appointment with care management team member scheduled for:  05/20/24 at 3 PM The patient will call ophthalmology office as advised to schedule appointment.  Referral placed for BSW. Scheduled 05/10/24 at 3 PM with Valora Gear             Patient verbalizes understanding of instructions and care plan provided today and agrees to view in MyChart. Active MyChart status and patient understanding of how to access instructions and care plan via MyChart confirmed with patient.     The Patient                                              will call opthalmology office at [phone]* as advised to schedule appointment.  RN Care Manager will continue with efforts to reach dentist office Social Worker will call patient for initial visit 05/10/24 at 3 PM.  Telephone follow up appointment with Managed Medicaid care management team member scheduled for: 05/20/24 at 3 PM  Travis Fish, RN MSN [place]  VBCI Population Health RN Care Manager Direct Dial: [phone]  Fax: [phone]   Following is a  copy of your plan of care:  There are no care plans that you recently modified to display for this patient.

## 2024-05-07 NOTE — Patient Outreach (Signed)
 Complex Care Management   Visit Note  05/07/2024  Name:  Travis Benson MRN: [id number] DOB: 12/27/1961  Situation: Referral received for Complex Care Management related to SDOH Barriers:  Food insecurity and HTN, history of R AKA I obtained verbal consent from Patient.  Visit completed with Kae Oram  on the phone  Background:   Past Medical History:  Diagnosis Date   Arthritis    neck   Coronary artery disease    Familial hypercholesterolemia 12/26/2021   History of hepatitis C    Hypertension    Left scapula fracture 03/02/2019   PAD (peripheral artery disease) (HCC) 12/26/2021    Assessment: Patient Reported Symptoms:  Cognitive Cognitive Status: Able to follow simple commands, Alert and oriented to person, place, and time, Normal speech and language skills Cognitive/Intellectual Conditions Management [RPT]: None reported or documented in medical history or problem list      Neurological Neurological Review of Symptoms: No symptoms reported    HEENT HEENT Symptoms Reported: Mouth or teeth pain (L sided mouth pain, unable to chew hard foods) HEENT Conditions: Tooth problem(s), Vision problem(s) Tooth Problems: pain (L sided) Vision Problems:  (Blurry vision) HEENT Management Strategies: Coping strategies HEENT Comment: Patient has not heard from practices regarding referrals for dentist and opthalmology placed 04/15/24. Called and left message for [facility] where referral was sent, waiting to hear back. Called and spoke with representative at [facility]. They have receieved referral but have not called patient to schedule yet. Note that new patient visits are scheduled out until December. Will provide patient with office number so that he can go ahead and schedule visit. Tooth problem(s), Vision problem(s)  Cardiovascular Cardiovascular Symptoms Reported: No symptoms reported Does patient have  uncontrolled Hypertension?: No Cardiovascular Conditions: Coronary artery disease, Hypertension Cardiovascular Management Strategies: Medication therapy  Respiratory Respiratory Symptoms Reported: No symptoms reported    Endocrine Patient reports the following symptoms related to hypoglycemia or hyperglycemia : No symptoms reported Is patient diabetic?: No    Gastrointestinal Gastrointestinal Symptoms Reported: No symptoms reported (Last BM today) Gastrointestinal Conditions: Reflux/heartburn Gastrointestinal Management Strategies: Medication therapy Nutrition Risk Screen (CP): No indicators present  Genitourinary Genitourinary Symptoms Reported: No symptoms reported Additional Genitourinary Details: Patient reports urinary incontinence has eased up since starting Tamsulosin  Genitourinary Management Strategies: Medication therapy  Integumentary Integumentary Symptoms Reported: No symptoms reported    Musculoskeletal   Musculoskeletal Conditions: Other, Amputation, Mobility limited (R AKA following accident 5 years ago) Other Musculoskeletal Conditions: Torn L rotator cuff x5 years. Reports he is unable to raise his arm above shoulder height, limited strength. He has been told he needed surgery but put it off because he doesn't want to have to rely on his wife for the 4-6 week recovery period. He reports that he still sees ortho regularly. Musculoskeletal Management Strategies: Medical device Falls in the past year?: Yes Number of falls in past year: 2 or more Was there an injury with Fall?: No Fall Risk Category Calculator: 2 Patient Fall Risk Level: Moderate Fall Risk Patient at Risk for Falls Due to: Other (Comment) (R AKA with prosthesis) Fall risk Follow up: Falls evaluation completed, Education provided  Psychosocial Psychosocial Symptoms Reported: No symptoms reported     Quality of Family Relationships: helpful, involved, supportive Do you feel physically threatened by others?:  No      04/15/2024    3:27 PM  Depression screen PHQ 2/9  Decreased Interest 0  Down, Depressed,  Hopeless 0  PHQ - 2 Score 0  Altered sleeping 0  Tired, decreased energy 0  Change in appetite 0  Feeling bad or failure about yourself  0  Trouble concentrating 0  Moving slowly or fidgety/restless 0  Suicidal thoughts 0  PHQ-9 Score 0  Difficult doing work/chores Not difficult at all    Vitals:   05/06/24 1523  BP: 131/77    Medications Reviewed Today     Reviewed by Valaria Garland, RN (Registered Nurse) on 05/06/24 at 1529  Med List Status: <None>   Medication Order Taking? Sig Documenting Provider Last Dose Status Informant  aspirin  EC 81 MG EC tablet [id number]  Take 1 tablet (81 mg total) by mouth daily.  Patient not taking: Reported on 04/15/2024   Focht, Jessica L, [place]  Active Spouse/Significant Other  Blood Pressure Monitor DEVI [id number]  Use to check blood pressure daily Vernell Goldsmith, MD  Active   Evolocumab  (REPATHA  SURECLICK) 140 MG/ML SOAJ [id number]  Inject 140 mg into the skin every 14 (fourteen) days. Hassan Links, MD  Active   ezetimibe  (ZETIA ) 10 MG tablet [id number]  Take 1 tablet (10 mg total) by mouth daily. Vernell Goldsmith, MD  Active   Misc. Devices MISC [id number]  Single Loftstrand crutches.  Diagnosis unstable gait. Newlin, Enobong, MD  Active   pantoprazole  (PROTONIX ) 40 MG tablet [id number]  Take 1 tablet (40 mg total) by mouth daily. Vernell Goldsmith, MD  Active   rosuvastatin  (CRESTOR ) 20 MG tablet [id number]  Take 1 tablet (20 mg total) by mouth daily. Vernell Goldsmith, MD  Active   tamsulosin  (FLOMAX ) 0.4 MG CAPS capsule [id number]  Take 1 capsule (0.4 mg total) by mouth daily. Vernell Goldsmith, MD  Active   valsartan -hydrochlorothiazide  (DIOVAN -HCT) 80-12.5 MG tablet [id number]  Take 1 tablet by mouth daily. Vernell Goldsmith, MD  Active             Recommendation:   Referral to: BSW for assistance with advanced directives, SDOH  needs, and possible personal care services if patient proceeds with rotator cuff surgery Patient will call ophthalmology office to schedule an appointment RNCM is waiting for return call from dentist office to confirm referral was received  Follow Up Plan:   Telephone follow up appointment date/time:  05/20/24 at 3 PM Telephone initial appointment with BSW 05/10/24 at 3 PM  Theodora Fish, RN MSN [place]  [facility] RN Care Manager Direct Dial: [phone]  Fax: [phone]

## 2024-05-10 ENCOUNTER — Other Ambulatory Visit: Payer: Self-pay

## 2024-05-10 NOTE — Patient Instructions (Signed)
 Visit Information  Mr. Alm was given information about Medicaid Managed Care team care coordination services as a part of their [facility] Community Plan Medicaid benefit. Romaro Fama verbally consented to engagement with the [facility] Managed Care team.   If you are experiencing a medical emergency, please call 911 or report to your local emergency department or urgent care.   If you have a non-emergency medical problem during routine business hours, please contact your provider's office and ask to speak with a nurse.   For questions related to your [facility], please call: [phone] or visit the homepage here: [email or web address]  If you would like to schedule transportation through your [facility], please call the following number at least 2 days in advance of your appointment: [phone]   Rides for urgent appointments can also be made after hours by calling Member Services.  Call the Behavioral Health Crisis Line at [phone], at any time, 24 hours a day, 7 days a week. If you are in danger or need immediate medical attention call 911.  If you would like help to quit smoking, call 1-800-QUIT-NOW (806 137 4785) OR Espaol: 1-855-Djelo-Ya (4-403-474-2595) o para ms informacin haga clic aqu or Text READY to 638-756 to register via text  Mr. Sustaita - following are the goals we discussed in your visit today:   Goals Addressed             This Visit's Progress    BSW VBCI Social Work Care Plan       Problems:   Food Insecurity   CSW Clinical Goal(s):   Over the next 15 days the Patient will explore community resource options for unmet needs related to Financial Strain .  Interventions:  Social Determinants of Health in Patient with food and utilties: SDOH assessments completed: Food Insecurity  Evaluation of current treatment plan related to unmet  needs Food resources: provided food resources via mail  Patient Goals/Self-Care Activities:  Patient will contact food pantries for assistance with food  Plan:   SW will follow up on 05/21/24 at 3:00pm         Social Worker will follow up on 05/21/24 at 3:00.   Valora Gear, Florestine Hurl, MHA [place]  [facility] Social Worker, Population Health [phone]   Following is a copy of your plan of care:  There are no care plans that you recently modified to display for this patient.

## 2024-05-10 NOTE — Patient Outreach (Signed)
 Complex Care Management   Visit Note  05/10/2024  Name:  Travis Benson MRN: [id number] DOB: [birth year]  Situation: Referral received for Complex Care Management related to SDOH Barriers:  Food insecurity I obtained verbal consent from Patient.  Visit completed with patient  on the phone  Background:   Past Medical History:  Diagnosis Date   Arthritis    neck   Coronary artery disease    Familial hypercholesterolemia 12/26/2021   History of hepatitis C    Hypertension    Left scapula fracture 03/02/2019   PAD (peripheral artery disease) (HCC) 12/26/2021    Assessment: SW completed a telephone outreach with patient, he is not working, not receiving any income. Patient states wife pays most of the bills and they are receiving foodstamps. Patient and sw agreed for resources for food and unities to be mailed.   Recommendation:   No recommendations at this time.  Follow Up Plan:   Telephone follow-up 05/21/24 at 3:00pm  Valora Gear, BSW, MHA [place]  Value Based [facility] Social Worker, Population Health [phone]

## 2024-05-12 ENCOUNTER — Telehealth: Payer: Self-pay | Admitting: Critical Care Medicine

## 2024-05-12 NOTE — Telephone Encounter (Signed)
 Called & spoke to the patient. Verified name & DOB. Patient's appointment scheduled for 06/11/2024 at 2:00 pm. Patient acknowledged and had no further questions.   Copied from CRM [id number]. Topic: Appointments - Scheduling Inquiry for Clinic >> May 12, 2024  2:16 PM Antwanette L wrote: Reason for CRM: Patient is calling to r/s appt on 05/13/24 with Kandra Orn Ausdall. Please call patient at [phone]

## 2024-05-13 ENCOUNTER — Ambulatory Visit: Admitting: Critical Care Medicine

## 2024-05-13 ENCOUNTER — Ambulatory Visit: Admitting: Pharmacist

## 2024-05-20 ENCOUNTER — Other Ambulatory Visit: Payer: Self-pay

## 2024-05-20 NOTE — Patient Outreach (Signed)
 Complex Care Management   Visit Note  05/20/2024  Name:  Travis Benson MRN: [id number] DOB: May 20, 1961  Situation: Referral received for Complex Care Management related to HTN and history of R AKA I obtained verbal consent from Patient.  Visit completed with Kae Oram  on the phone  Background:   Past Medical History:  Diagnosis Date   Arthritis    neck   Coronary artery disease    Familial hypercholesterolemia 12/26/2021   History of hepatitis C    Hypertension    Left scapula fracture 03/02/2019   PAD (peripheral artery disease) (HCC) 12/26/2021    Assessment: Patient Reported Symptoms:  Cognitive Cognitive Status: Able to follow simple commands, Alert and oriented to person, place, and time, Normal speech and language skills Cognitive/Intellectual Conditions Management [RPT]: None reported or documented in medical history or problem list      Neurological Neurological Review of Symptoms: Not assessed    HEENT HEENT Symptoms Reported: Mouth or teeth pain (L sided mouth pain, unable to chew hard foods. Patient reports that 2 of his back teeth are loose and tender around the area.) HEENT Conditions: Tooth problem(s), Vision problem(s) Tooth Problems: pain (L sided pain, patient does feel that pain is getting worse) Vision Problems:  (Blurry vision, stable per patient) HEENT Management Strategies: Coping strategies (Gurggling with warm salt water ) HEENT Comment: Patient reports dentist did reach out. As of now he is scheduled 12/10/24 unless something opens up sooner. He has not heard from eye doctor nor has he called them as advised by [facility]. Provided phone number to contact. Tooth problem(s), Vision problem(s)  Cardiovascular Cardiovascular Symptoms Reported: Not assessed    Respiratory Respiratory Symptoms Reported: Not assesed    Endocrine Patient reports the following symptoms related to hypoglycemia or hyperglycemia : Not assessed    Gastrointestinal  Gastrointestinal Symptoms Reported: Not assessed      Genitourinary Genitourinary Symptoms Reported: No symptoms reported Additional Genitourinary Details: Patient denies episodes of urinary incontinence. He continues to take Tamsulosion as prescribed. Genitourinary Management Strategies: Medication therapy  Integumentary Integumentary Symptoms Reported: Not assessed    Musculoskeletal Musculoskelatal Symptoms Reviewed: No symptoms reported Musculoskeletal Conditions: Amputation, Mobility limited, Other (R AKA following accident 5 years ago) Other Musculoskeletal Conditions: Torn L rotator cuff x5 years. Patient is uanble to raise his arm above shoulder height, limited strength. He has been doing shoulder exercises previously provided by therapy. Musculoskeletal Management Strategies: Medical device, Exercise Falls in the past year?: Yes Number of falls in past year: 2 or more (No falls since previous CMRN visit) Was there an injury with Fall?: No Fall Risk Category Calculator: 2 Patient Fall Risk Level: Moderate Fall Risk Patient at Risk for Falls Due to: Other (Comment) (R AKA with prosthesis) Fall risk Follow up: Falls evaluation completed, Education provided  Psychosocial Psychosocial Symptoms Reported: Not assessed            04/15/2024    3:27 PM  Depression screen PHQ 2/9  Decreased Interest 0  Down, Depressed, Hopeless 0  PHQ - 2 Score 0  Altered sleeping 0  Tired, decreased energy 0  Change in appetite 0  Feeling bad or failure about yourself  0  Trouble concentrating 0  Moving slowly or fidgety/restless 0  Suicidal thoughts 0  PHQ-9 Score 0  Difficult doing work/chores Not difficult at all    There were no vitals filed for this visit.  Medications Reviewed Today     Reviewed by Valaria Garland, RN (Registered  Nurse) on 05/20/24 at 1513  Med List Status: <None>   Medication Order Taking? Sig Documenting Provider Last Dose Status Informant  aspirin  EC 81  MG EC tablet [id number] No Take 1 tablet (81 mg total) by mouth daily.  Patient not taking: Reported on 04/15/2024   Josem Nick, [place] Not Taking Active Spouse/Significant Other  Blood Pressure Monitor DEVI [id number] No Use to check blood pressure daily Vernell Goldsmith, MD Taking Active   Evolocumab  (REPATHA  SURECLICK) 140 MG/ML SOAJ [id number] No Inject 140 mg into the skin every 14 (fourteen) days. Hassan Links, MD Taking Active   ezetimibe  (ZETIA ) 10 MG tablet [id number] No Take 1 tablet (10 mg total) by mouth daily. Vernell Goldsmith, MD Taking Active   Misc. Devices MISC [id number] No Single [place] crutches.  Diagnosis unstable gait. Newlin, Enobong, MD Taking Active   pantoprazole  (PROTONIX ) 40 MG tablet [id number] No Take 1 tablet (40 mg total) by mouth daily. Vernell Goldsmith, MD Taking Active   rosuvastatin  (CRESTOR ) 20 MG tablet [id number] No Take 1 tablet (20 mg total) by mouth daily. Vernell Goldsmith, MD Taking Active   tamsulosin  (FLOMAX ) 0.4 MG CAPS capsule [id number] No Take 1 capsule (0.4 mg total) by mouth daily. Vernell Goldsmith, MD Taking Active   valsartan -hydrochlorothiazide  (DIOVAN -HCT) 80-12.5 MG tablet [id number] No Take 1 tablet by mouth daily. Vernell Goldsmith, MD Taking Active             Recommendation:   Specialty provider follow-up patient to call ophthalmology office to schedule appointment  Follow Up Plan:   Telephone follow up appointment date/time:  06/03/24 at 3 PM  Theodora Fish, RN MSN [place]  [facility] Health RN Care Manager Direct Dial: [phone]  Fax: [phone]

## 2024-05-20 NOTE — Patient Instructions (Signed)
 Visit Information  Travis Benson was given information about Medicaid Managed Care team care coordination services as a part of their [facility] Community Plan Medicaid benefit. Travis Benson verbally consented to engagement with the [facility] Managed Care team.   If you are experiencing a medical emergency, please call 911 or report to your local emergency department or urgent care.   If you have a non-emergency medical problem during routine business hours, please contact your provider's office and ask to speak with a nurse.   For questions related to your [facility], please call: [phone] or visit the homepage here: [email or web address]  If you would like to schedule transportation through your [facility], please call the following number at least 2 days in advance of your appointment: [phone]   Rides for urgent appointments can also be made after hours by calling Member Services.  Call the Behavioral Health Crisis Line at [phone], at any time, 24 hours a day, 7 days a week. If you are in danger or need immediate medical attention call 911.  If you would like help to quit smoking, call 1-800-QUIT-NOW (403-318-8718) OR Espaol: 1-855-Djelo-Ya (7-253-664-4034) o para ms informacin haga clic aqu or Text READY to 742-595 to register via text  Travis Benson - following are the goals we discussed in your visit today:   Goals Addressed             This Visit's Progress    VBCI RN Care Plan   On track    Problems:  Care Coordination needs related to Financial Strain  Chronic Disease Management support and education needs related to HTN and history of R AKA No Advanced Directives in place  Goal: Over the next 30 days the Patient will collaborate with the care management team towards completion of advanced directives   as evidenced by completed directives uploaded to  EMR  demonstrate Ongoing adherence to prescribed treatment plan for HTN as evidenced by reported home readings below goal of <140/90 take all medications exactly as prescribed and will call provider for medication related questions as evidenced by medication adherence    Schedule ophthalmology appointments as evidenced by patient report  Interventions:   Evaluation of current treatment plan related to HTN and history of R AKA, Financial constraints related to housing and food self-management and patient's adherence to plan as established by provider. Discussed plans with patient for ongoing care management follow up and provided patient with direct contact information for care management team Discussed plans with patient for ongoing care management follow up and provided patient with direct contact information for care management team Provided patient with ophthalmologist phone number to request appointment - [phone] Continue to work with social worker to address creating advanced directives, possible personal care services if he gets surgery, and SDOH needs  Hypertension Interventions: Last practice recorded BP readings:  BP Readings from Last 3 Encounters:  05/06/24 131/77  04/15/24 132/77  05/27/23 128/78   Most recent eGFR/CrCl:  Lab Results  Component Value Date   EGFR 62 04/15/2024    No components found for: "CRCL"  Evaluation of current treatment plan related to hypertension self management and patient's adherence to plan as established by provider Reviewed medications with patient and discussed importance of compliance Discussed plans with patient for ongoing care management follow up and provided patient with direct contact information for care management team Advised patient, providing education and rationale, to monitor blood pressure daily and record, calling PCP  for findings outside established parameters  Patient Self-Care Activities:  Attend all scheduled  provider appointments Call provider office for new concerns or questions  Perform all self care activities independently  Take medications as prescribed   Work with the social worker to address care coordination needs and will continue to work with the clinical team to address health care and disease management related needs check blood pressure 3 times per week keep a blood pressure log take blood pressure log to all doctor appointments take medications for blood pressure exactly as prescribed  Plan:  Telephone follow up appointment with care management team member scheduled for:  06/03/24 at 3 PM The patient will call ophthalmology office as advised to schedule appointment.               Patient verbalizes understanding of instructions and care plan provided today and agrees to view in MyChart. Active MyChart status and patient understanding of how to access instructions and care plan via MyChart confirmed with patient.     Telephone follow up appointment with Managed Medicaid care management team member scheduled for: 06/03/24 at 3 PM  Theodora Fish, RN MSN [place]  VBCI Population Health RN Care Manager Direct Dial: [phone]  Fax: [phone]   Following is a copy of your plan of care:  There are no care plans that you recently modified to display for this patient.

## 2024-05-21 ENCOUNTER — Other Ambulatory Visit: Payer: Self-pay

## 2024-05-21 NOTE — Patient Outreach (Signed)
 Complex Care Management   Visit Note  05/21/2024  Name:  Travis Benson MRN: [id number] DOB: August 21, 1961  Situation: Referral received for Complex Care Management related to SDOH Barriers:  Food insecurity I obtained verbal consent from Patient.  Visit completed with patient   on the phone  Background:   Past Medical History:  Diagnosis Date   Arthritis    neck   Coronary artery disease    Familial hypercholesterolemia 12/26/2021   History of hepatitis C    Hypertension    Left scapula fracture 03/02/2019   PAD (peripheral artery disease) (HCC) 12/26/2021    Assessment: SW completed a telephone outreach with patient, he states he does not know if food resources were receieved, he will check with his wife and have her check the mail. Patient states he will contact SW to let her know if they were received. No other resources are needed at this time. SW will schedule another follow up to check on resources.   SDOH Interventions    Flowsheet Row Patient Outreach from 05/21/2024 in [place] POPULATION HEALTH DEPARTMENT Patient Outreach from 05/06/2024 in [facility] HEALTH POPULATION HEALTH DEPARTMENT Office Visit from 04/15/2024 in [facility] Health Comm Health [place] - A Dept Of [place] Office Visit from 04/03/2023 in [facility] Health Comm Health [place] - A Dept Of Tommas Fragmin. [facility]  SDOH Interventions      Food Insecurity Interventions Community Resources Provided Other (Comment), Intervention Not Indicated  [Utilizes food banks and food stamps] Intervention Not Indicated Intervention Not Indicated  Housing Interventions -- AMB Referral Intervention Not Indicated Intervention Not Indicated  Transportation Interventions -- Intervention Not Indicated Intervention Not Indicated Intervention Not Indicated  Utilities Interventions -- Intervention Not Indicated Intervention Not Indicated Intervention Not Indicated  Alcohol Usage Interventions -- -- Intervention Not  Indicated (Score <7) Intervention Not Indicated (Score <7)  Financial Strain Interventions -- -- Intervention Not Indicated Intervention Not Indicated  Physical Activity Interventions -- -- Intervention Not Indicated --  Stress Interventions -- -- Intervention Not Indicated Intervention Not Indicated  Social Connections Interventions -- -- -- Intervention Not Indicated  Health Literacy Interventions -- -- Intervention Not Indicated --       Recommendation:   No recommendations at this time.  Follow Up Plan:   SW will follow up on 06/04/24 at 1:00pm  Valora Gear, BSW, [facility]  Value Based [facility] Social Worker, Population Health [phone]

## 2024-05-21 NOTE — Patient Instructions (Signed)
 Visit Information  Travis Benson was given information about Medicaid Managed Care team care coordination services as a part of their [facility] Community Plan Medicaid benefit. Travis Benson verbally consented to engagement with the [facility] Managed Care team.   If you are experiencing a medical emergency, please call 911 or report to your local emergency department or urgent care.   If you have a non-emergency medical problem during routine business hours, please contact your provider's office and ask to speak with a nurse.   For questions related to your [facility], please call: [phone] or visit the homepage here: [email or web address]  If you would like to schedule transportation through your [facility], please call the following number at least 2 days in advance of your appointment: [phone]   Rides for urgent appointments can also be made after hours by calling Member Services.  Call the Behavioral Health Crisis Line at [phone], at any time, 24 hours a day, 7 days a week. If you are in danger or need immediate medical attention call 911.  If you would like help to quit smoking, call 1-800-QUIT-NOW (484-209-8041) OR Espaol: 1-855-Djelo-Ya (6-440-347-4259) o para ms informacin haga clic aqu or Text READY to 563-875 to register via text  Mr. Covelli - following are the goals we discussed in your visit today:   Goals Addressed             This Visit's Progress    BSW VBCI Social Work Care Plan       Problems:   Food Insecurity   CSW Clinical Goal(s):   Over the next 15 days the Patient will explore community resource options for unmet needs related to Financial Strain .  Interventions:  Social Determinants of Health in Patient with food and utilties: SDOH assessments completed: Food Insecurity  Evaluation of current treatment plan related to unmet  needs Food resources: provided food resources via mail Patient not sure if resources for food have been received.  Patient Goals/Self-Care Activities:  Patient will contact food pantries for assistance with food. Patient will contact SW back to let her know if resources have been received  Plan:   The patient has been provided with contact information for the care management team and has been advised to call with any health related questions or concerns.           Social Worker will follow pup on 06/04/24.  Valora Gear, Florestine Hurl, MHA [place]  [facility] Social Worker, Population Health [phone]   Following is a copy of your plan of care:  There are no care plans that you recently modified to display for this patient.

## 2024-06-02 ENCOUNTER — Other Ambulatory Visit: Payer: Self-pay

## 2024-06-03 ENCOUNTER — Other Ambulatory Visit: Payer: Self-pay

## 2024-06-03 NOTE — Patient Outreach (Signed)
 Complex Care Management   Visit Note  06/03/2024  Name:  Travis Benson MRN: [id number] DOB: 1961/09/04  Situation: Referral received for Complex Care Management related to SDOH Barriers:  Food insecurity  and history of HTN and R AKA I obtained verbal consent from Patient.  Visit completed with Kae Oram  on the phone  Background:   Past Medical History:  Diagnosis Date   Arthritis    neck   Coronary artery disease    Familial hypercholesterolemia 12/26/2021   History of hepatitis C    Hypertension    Left scapula fracture 03/02/2019   PAD (peripheral artery disease) (HCC) 12/26/2021    Assessment: Patient Reported Symptoms:  Cognitive Cognitive Status: Able to follow simple commands, Alert and oriented to person, place, and time, Normal speech and language skills Cognitive/Intellectual Conditions Management [RPT]: None reported or documented in medical history or problem list      Neurological Neurological Review of Symptoms: Not assessed    HEENT HEENT Symptoms Reported: Mouth or teeth pain HEENT Conditions: Vision problem(s), Tooth problem(s) Tooth Problems: pain, loose (Tooth on L side of mouth is getting more loose and feels like a piece of it is broken.) Vision Problems: blindness/vision loss (Vision loss, patient is currently only wearing reading glasses. Uses magnifying glass.) HEENT Management Strategies: Routine screening HEENT Comment: patient has not called eye doctor as advised at previous visit. Encouraged to do so and ensured he still has contact information. Patient is requesting phone number for dentist. He reports that tooth on L side of his mouth seems to be getting more loose and causing more pain/discomfort. He would like to get an earlier appointment (appointment not until 12/10/24). Provided with office phone number and advised to let care team know if he runs into any issues. Vision problem(s), Tooth problem(s)  Cardiovascular Cardiovascular  Symptoms Reported: Not assessed    Respiratory Respiratory Symptoms Reported: Not assesed    Endocrine Patient reports the following symptoms related to hypoglycemia or hyperglycemia : Not assessed    Gastrointestinal Gastrointestinal Symptoms Reported: Not assessed      Genitourinary Genitourinary Symptoms Reported: Not assessed    Integumentary Integumentary Symptoms Reported: Not assessed    Musculoskeletal Musculoskelatal Symptoms Reviewed: Not assessed        Psychosocial Psychosocial Symptoms Reported: Not assessed            04/15/2024    3:27 PM  Depression screen PHQ 2/9  Decreased Interest 0  Down, Depressed, Hopeless 0  PHQ - 2 Score 0  Altered sleeping 0  Tired, decreased energy 0  Change in appetite 0  Feeling bad or failure about yourself  0  Trouble concentrating 0  Moving slowly or fidgety/restless 0  Suicidal thoughts 0  PHQ-9 Score 0  Difficult doing work/chores Not difficult at all    There were no vitals filed for this visit.  Medications Reviewed Today     Reviewed by Valaria Garland, RN (Registered Nurse) on 06/03/24 at 1508  Med List Status: <None>   Medication Order Taking? Sig Documenting Provider Last Dose Status Informant  aspirin  EC 81 MG EC tablet [id number] No Take 1 tablet (81 mg total) by mouth daily.  Patient not taking: Reported on 04/15/2024   Josem Nick, [place] Not Taking Active Spouse/Significant Other  Blood Pressure Monitor DEVI [id number] No Use to check blood pressure daily Vernell Goldsmith, MD Taking Active   Evolocumab  (REPATHA  SURECLICK) 140 MG/ML SOAJ [id number] No Inject 140 mg into the  skin every 14 (fourteen) days. Hassan Links, MD Taking Active   ezetimibe  (ZETIA ) 10 MG tablet [id number] No Take 1 tablet (10 mg total) by mouth daily. Vernell Goldsmith, MD Taking Active   Misc. Devices MISC [id number] No Single Loftstrand crutches.  Diagnosis unstable gait. Newlin, Enobong, MD Taking Active   pantoprazole   (PROTONIX ) 40 MG tablet [id number] No Take 1 tablet (40 mg total) by mouth daily. Vernell Goldsmith, MD Taking Active   rosuvastatin  (CRESTOR ) 20 MG tablet [id number] No Take 1 tablet (20 mg total) by mouth daily. Vernell Goldsmith, MD Taking Active   tamsulosin  (FLOMAX ) 0.4 MG CAPS capsule [id number] No Take 1 capsule (0.4 mg total) by mouth daily. Vernell Goldsmith, MD Taking Active   valsartan -hydrochlorothiazide  (DIOVAN -HCT) 80-12.5 MG tablet [id number] No Take 1 tablet by mouth daily. Vernell Goldsmith, MD Taking Active             Recommendation:   Specialty provider follow-up dentist and opthalmology Continue Current Plan of Care  Follow Up Plan:   Telephone follow up appointment date/time:  06/21/24 at 3 PM  Theodora Fish, RN MSN [place]  [facility] Health RN Care Manager Direct Dial: [phone]  Fax: [phone]

## 2024-06-03 NOTE — Patient Instructions (Signed)
 Visit Information  Travis Benson was given information about Medicaid Managed Care team care coordination services as a part of their [facility] Community Plan Medicaid benefit. Travis Benson verbally consented to engagement with the [facility] Managed Care team.   If you are experiencing a medical emergency, please call 911 or report to your local emergency department or urgent care.   If you have a non-emergency medical problem during routine business hours, please contact your provider's office and ask to speak with a nurse.   For questions related to your [facility], please call: [phone] or visit the homepage here: [email or web address]  If you would like to schedule transportation through your [facility], please call the following number at least 2 days in advance of your appointment: [phone]   Rides for urgent appointments can also be made after hours by calling Member Services.  Call the Behavioral Health Crisis Line at [phone], at any time, 24 hours a day, 7 days a week. If you are in danger or need immediate medical attention call 911.  If you would like help to quit smoking, call 1-800-QUIT-NOW (970-493-1889) OR Espaol: 1-855-Djelo-Ya (3-474-259-5638) o para ms informacin haga clic aqu or Text READY to 756-433 to register via text  Travis Benson - following are the goals we discussed in your visit today:   Goals Addressed             This Visit's Progress    VBCI RN Care Plan   On track    Problems:  Care Coordination needs related to Financial Strain  Chronic Disease Management support and education needs related to HTN and history of R AKA No Advanced Directives in place  Goal: Over the next 30 days the Patient will collaborate with the care management team towards completion of advanced directives   as evidenced by completed directives uploaded to  EMR  demonstrate Ongoing adherence to prescribed treatment plan for HTN as evidenced by reported home readings below goal of <140/90 take all medications exactly as prescribed and will call provider for medication related questions as evidenced by medication adherence    Schedule ophthalmology appointment as evidenced by patient report Schedule earlier dental appointment (currently scheduled 12/10/24)  Interventions:   Evaluation of current treatment plan related to HTN and history of R AKA, Financial constraints related to housing and food self-management and patient's adherence to plan as established by provider. Discussed plans with patient for ongoing care management follow up and provided patient with direct contact information for care management team Discussed plans with patient for ongoing care management follow up and provided patient with direct contact information for care management team Provided patient with ophthalmologist phone number to request appointment - [phone] Provided patient with dentist phone number to request earlier appointment - [phone]  Continue to work with Child psychotherapist to address creating advanced directives, possible personal care services if he gets surgery, and SDOH needs  Hypertension Interventions: Last practice recorded BP readings:  BP Readings from Last 3 Encounters:  05/06/24 131/77  04/15/24 132/77  05/27/23 128/78   Most recent eGFR/CrCl:  Lab Results  Component Value Date   EGFR 62 04/15/2024    No components found for: "CRCL"  Evaluation of current treatment plan related to hypertension self management and patient's adherence to plan as established by provider Reviewed medications with patient and discussed importance of compliance Discussed plans with patient for ongoing care management follow up and provided patient with direct  contact information for care management team Advised patient, providing education and rationale, to  monitor blood pressure daily and record, calling PCP for findings outside established parameters  Patient Self-Care Activities:  Attend all scheduled provider appointments Call provider office for new concerns or questions  Perform all self care activities independently  Take medications as prescribed   Work with the social worker to address care coordination needs and will continue to work with the clinical team to address health care and disease management related needs check blood pressure 3 times per week keep a blood pressure log take blood pressure log to all doctor appointments take medications for blood pressure exactly as prescribed  Plan:  Telephone follow up appointment with care management team member scheduled for:  06/21/24 at 3 PM The patient will call dentist and ophthalmology office as advised to schedule appointment.              Patient verbalizes understanding of instructions and care plan provided today and agrees to view in MyChart. Active MyChart status and patient understanding of how to access instructions and care plan via MyChart confirmed with patient.     The Patient                                              will call [facility]* as advised to request an earlier appointment.  Telephone follow up appointment with Managed Medicaid care management team member scheduled for: 06/21/24 at 3 PM  Theodora Fish, RN MSN [place]  VBCI Population Health RN Care Manager Direct Dial: [phone]  Fax: [phone]   Following is a copy of your plan of care:  There are no care plans that you recently modified to display for this patient.

## 2024-06-04 ENCOUNTER — Other Ambulatory Visit: Payer: Self-pay

## 2024-06-04 NOTE — Patient Outreach (Signed)
 Complex Care Management   Visit Note  06/04/2024  Name:  Travis Benson MRN: [id number] DOB: 07-Apr-1961  Situation: Referral received for Complex Care Management related to SDOH Barriers:  Food insecurity I obtained verbal consent from Patient.  Visit completed with patient  on the phone  Background:   Past Medical History:  Diagnosis Date   Arthritis    neck   Coronary artery disease    Familial hypercholesterolemia 12/26/2021   History of hepatitis C    Hypertension    Left scapula fracture 03/02/2019   PAD (peripheral artery disease) (HCC) 12/26/2021    Assessment: SW completed a telephone outreach with patient, he states he did not receive the reosurces SW and patient agreed for resources to be resent via mail and email on file.  SDOH Interventions    Flowsheet Row Patient Outreach from 05/21/2024 in [place] POPULATION HEALTH DEPARTMENT Patient Outreach from 05/06/2024 in [facility] HEALTH POPULATION HEALTH DEPARTMENT Office Visit from 04/15/2024 in [facility] Comm [facility] [place] - A Dept Of [place] Office Visit from 04/03/2023 in [facility] Health Comm Health [place] - A Dept Of Tommas [facility]ag[facility]. [facility]  SDOH Interventions      Food Insecurity Interventions Community Resources Provided Other (Comment), Intervention Not Indicated  [Utilizes food banks and food stamps] Intervention Not Indicated Intervention Not Indicated  Housing Interventions -- AMB Referral Intervention Not Indicated Intervention Not Indicated  Transportation Interventions -- Intervention Not Indicated Intervention Not Indicated Intervention Not Indicated  Utilities Interventions -- Intervention Not Indicated Intervention Not Indicated Intervention Not Indicated  Alcohol Usage Interventions -- -- Intervention Not Indicated (Score <7) Intervention Not Indicated (Score <7)  Financial Strain Interventions -- -- Intervention Not Indicated Intervention Not Indicated  Physical Activity  Interventions -- -- Intervention Not Indicated --  Stress Interventions -- -- Intervention Not Indicated Intervention Not Indicated  Social Connections Interventions -- -- -- Intervention Not Indicated  Health Literacy Interventions -- -- Intervention Not Indicated --         Recommendation:   No recommendations at this time  Follow Up Plan:   Telephone follow-up on 06/18/24 130  Valora Gear, [place], [place]  [facility] Social Worker, Population Health [phone]

## 2024-06-04 NOTE — Patient Instructions (Signed)
 Visit Information  Travis Benson was given information about Medicaid Managed Care team care coordination services as a part of their [facility] Community Plan Medicaid benefit. Travis Benson verbally consented to engagement with the [facility] Managed Care team.   If you are experiencing a medical emergency, please call 911 or report to your local emergency department or urgent care.   If you have a non-emergency medical problem during routine business hours, please contact your provider's office and ask to speak with a nurse.   For questions related to your [facility], please call: [phone] or visit the homepage here: [email or web address]  If you would like to schedule transportation through your [facility], please call the following number at least 2 days in advance of your appointment: [phone]   Rides for urgent appointments can also be made after hours by calling Member Services.  Call the Behavioral Health Crisis Line at [phone], at any time, 24 hours a day, 7 days a week. If you are in danger or need immediate medical attention call 911.  If you would like help to quit smoking, call 1-800-QUIT-NOW (563-157-9305) OR Espaol: 1-855-Djelo-Ya (0-347-425-9563) o para ms informacin haga clic aqu or Text READY to 875-643 to register via text  Travis Benson - following are the goals we discussed in your visit today:   Goals Addressed             This Visit's Progress    BSW VBCI Social Work Care Plan       Problems:   Food Insecurity   CSW Clinical Goal(s):   Over the next 15 days the Patient will explore community resource options for unmet needs related to Financial Strain .  Interventions:  Social Determinants of Health in Patient with food and utilties: SDOH assessments completed: Food Insecurity  Evaluation of current treatment plan related to unmet  needs Food resources: provided food resources via mail Patient not sure if resources for food have been received. SW resent resources to patient via mail and email on file.  Patient Goals/Self-Care Activities:  Patient will contact food pantries for assistance with food.   Plan:   SW will follow up on          Social Worker will follow up 06/18/24.   Travis Benson, Travis Benson, MHA [place]  [facility] Social Worker, Population Health [phone]   Following is a copy of your plan of care:  There are no care plans that you recently modified to display for this patient.

## 2024-06-06 NOTE — Progress Notes (Incomplete)
   S:     No chief complaint on file.  63 y.o. male who presents for hypertension evaluation, education, and management.  PMH is significant for HTN, CAD, PAD.    Patient was referred and last seen by Primary Care Provider, Dr. Brent Cambric, on 04/15/2024. BP was 1132/77 mmHg. Valsartan -HCTZ was increased to 60-25 mg daily.   Today, patient arrives in good spirits and presents without assistance. Denies dizziness, headache, blurred vision, swelling. ***  Patient reports hypertension is longstanding.   Family/Social history:   Fhx: kidney failure Tobacco: never smoker  Alcohol: none  Insurance: [place] Medicaid   Medication adherence reported. Patient has taken BP medications today. ***  Current antihypertensives include: valsartan -HCTZ 160-25 mg daily   Antihypertensives tried in the past include: amlodipine , lisinopril    Reported home BP readings: none ***  Patient reported dietary habits:  -Compliant with sodium restriction.  -Denies drinking excessive amounts of caffeine   Patient-reported exercise habits:  -None reported  O:  There were no vitals filed for this visit.  Last 3 Office BP readings: BP Readings from Last 3 Encounters:  05/06/24 131/77  04/15/24 132/77  05/27/23 128/78    BMET    Component Value Date/Time   NA 139 04/15/2024 1611   K 4.3 04/15/2024 1611   CL 103 04/15/2024 1611   CO2 21 04/15/2024 1611   GLUCOSE 92 04/15/2024 1611   GLUCOSE 98 11/19/2019 0723   BUN 13 04/15/2024 1611   CREATININE 1.29 (H) 04/15/2024 1611   CREATININE 0.96 08/24/2019 1040   CALCIUM  9.6 04/15/2024 1611   GFRNONAA 56 (L) 02/20/2021 1522   GFRAA 64 02/20/2021 1522    Renal function: CrCl cannot be calculated (Patient's most recent lab result is older than the maximum 21 days allowed.).  Clinical ASCVD: Yes  The ASCVD Risk score (Arnett DK, et al., 2019) failed to calculate for the following reasons:   The valid total cholesterol range is 130 to 320 mg/dL  Patient  is participating in a Managed Medicaid Plan:  Yes    A/P: Hypertension longstanding currently at goal *** on current medications. BP goal < 130/80 mmHg. Medication adherence appears appropriate.  -Continued valsartan -HCTZ at current dose.  -Patient educated on purpose, proper use, and potential adverse effects of valsartan , HCTZ.  -Counseled on lifestyle modifications for blood pressure control including reduced dietary sodium, increased exercise, adequate sleep. -Encouraged patient to check BP at home and bring log of readings to next visit. Counseled on proper use of home BP cuff.   Results reviewed and written information provided.    Written patient instructions provided. Patient verbalized understanding of treatment plan.  Total time in face to face counseling 30 minutes.    Follow-up:  PCP on 10/13/2024  Juleen Oakland, PharmD PGY1 Pharmacy Resident

## 2024-06-07 ENCOUNTER — Ambulatory Visit: Admitting: Pharmacist

## 2024-06-10 ENCOUNTER — Encounter (HOSPITAL_BASED_OUTPATIENT_CLINIC_OR_DEPARTMENT_OTHER): Payer: Self-pay

## 2024-06-10 ENCOUNTER — Other Ambulatory Visit: Payer: Self-pay

## 2024-06-10 ENCOUNTER — Emergency Department (HOSPITAL_BASED_OUTPATIENT_CLINIC_OR_DEPARTMENT_OTHER)
Admission: EM | Admit: 2024-06-10 | Discharge: 2024-06-10 | Disposition: A | Discharge status: Home / Self Care | Attending: Emergency Medicine | Admitting: Emergency Medicine

## 2024-06-10 DIAGNOSIS — Z79899 Other long term (current) drug therapy: Secondary | ICD-10-CM | POA: Diagnosis not present

## 2024-06-10 DIAGNOSIS — Z7982 Long term (current) use of aspirin: Secondary | ICD-10-CM | POA: Insufficient documentation

## 2024-06-10 DIAGNOSIS — I2510 Atherosclerotic heart disease of native coronary artery without angina pectoris: Secondary | ICD-10-CM | POA: Diagnosis not present

## 2024-06-10 DIAGNOSIS — I10 Essential (primary) hypertension: Secondary | ICD-10-CM | POA: Diagnosis not present

## 2024-06-10 DIAGNOSIS — H578A1 Foreign body sensation, right eye: Secondary | ICD-10-CM | POA: Diagnosis present

## 2024-06-10 MED ORDER — FLUORESCEIN SODIUM 1 MG OP STRP
1.0000 | ORAL_STRIP | Freq: Once | OPHTHALMIC | Status: AC
  Administered 2024-06-10: 1 via OPHTHALMIC
  Filled 2024-06-10: qty 1

## 2024-06-10 MED ORDER — TETRACAINE HCL 0.5 % OP SOLN
2.0000 [drp] | Freq: Once | OPHTHALMIC | Status: AC
  Administered 2024-06-10: 2 [drp] via OPHTHALMIC
  Filled 2024-06-10: qty 4

## 2024-06-10 NOTE — Discharge Instructions (Signed)
 Give ophthalmology a call in the morning approximately by 9:00.  Let them know you are seen in the emergency department with this foreign body sensation our evaluation negative.  They will see you in follow-up.  Return for any new or worse symptoms.

## 2024-06-10 NOTE — ED Provider Notes (Addendum)
 [place] [facility]ERGENCY [facility]PART[facility] Provider Note   CSN: [id number] Arrival date & time: 06/10/24  2010     Patient presents with: Foreign Body in [facility] Travis Benson is a 63 y.o. male.   Patient with a foreign body sensation in the right eye x 2 days.  Does not wear contacts.  No headache no nausea vomiting.  Patient only wears reading glasses.  Not aware of anything getting in his eye no grinding no welding.  No visual change.  Past medical history significant for hypertension coronary artery disease peripheral artery disease.  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  Has never used tobacco products.       Prior to Admission medications   Medication Sig Start Date End Date Taking? Authorizing Provider  aspirin  EC 81 MG EC tablet Take 1 tablet (81 mg total) by mouth daily. Patient not taking: Reported on 04/15/2024 04/29/19   Josem Nick, [place]  Blood Pressure Monitor DEVI Use to check blood pressure daily 06/26/22   Vernell Goldsmith, MD  Evolocumab  (REPATHA  SURECLICK) 140 MG/ML SOAJ Inject 140 mg into the skin every 14 (fourteen) days. 05/06/24   Hassan Links, MD  ezetimibe  (ZETIA ) 10 MG tablet Take 1 tablet (10 mg total) by mouth daily. 04/15/24   Vernell Goldsmith, MD  Misc. Devices MISC Single Loftstrand crutches.  Diagnosis unstable gait. 12/10/21   Newlin, Enobong, MD  pantoprazole  (PROTONIX ) 40 MG tablet Take 1 tablet (40 mg total) by mouth daily. 04/15/24   Vernell Goldsmith, MD  rosuvastatin  (CRESTOR ) 20 MG tablet Take 1 tablet (20 mg total) by mouth daily. 04/15/24   Vernell Goldsmith, MD  tamsulosin  (FLOMAX ) 0.4 MG CAPS capsule Take 1 capsule (0.4 mg total) by mouth daily. 04/15/24   Vernell Goldsmith, MD  valsartan -hydrochlorothiazide  (DIOVAN -HCT) 80-12.5 MG tablet Take 1 tablet by mouth daily. 04/16/24   Vernell Goldsmith, MD    Allergies: Contrast media [iodinated contrast media]    Review of Systems   Constitutional:  Negative for chills and fever.  HENT:  Negative for ear pain and sore throat.   Eyes:  Negative for photophobia, pain, discharge, redness, itching and visual disturbance.  Respiratory:  Negative for cough and shortness of breath.   Cardiovascular:  Negative for chest pain and palpitations.  Gastrointestinal:  Negative for abdominal pain and vomiting.  Genitourinary:  Negative for dysuria and hematuria.  Musculoskeletal:  Negative for arthralgias and back pain.  Skin:  Negative for color change and rash.  Neurological:  Negative for seizures and syncope.  All other systems reviewed and are negative.   Updated Vital Signs BP (!) 141/80 (BP Location: Left Arm)   Pulse 88   Temp 98.3 F (36.8 C)   Resp 20   Wt 112.9 kg   SpO2 98%   BMI 33.77 kg/m   Physical Exam Vitals and nursing note reviewed.  Constitutional:      General: He is not in acute distress.    Appearance: He is well-developed.  HENT:     Head: Normocephalic and atraumatic.   Eyes:     General: No scleral icterus.       Right eye: No discharge.        Left eye: No discharge.     Extraocular Movements: Extraocular movements intact.     Conjunctiva/sclera: Conjunctivae normal.     Pupils: Pupils are equal, round, and reactive to light.  Comments: Right eye had at approximately the 9 o'clock position with the sclera with a about a 1 mm bump no erythema nonmobile.  Will stain the cornea and used the black light for further evaluation.  Anterior chamber normal.   Cardiovascular:     Rate and Rhythm: Normal rate and regular rhythm.     Heart sounds: No murmur heard. Pulmonary:     Effort: Pulmonary effort is normal. No respiratory distress.     Breath sounds: Normal breath sounds.  Abdominal:     Palpations: Abdomen is soft.     Tenderness: There is no abdominal tenderness.   Musculoskeletal:        General: No swelling.     Cervical back: Neck supple.     Comments: Right lower  extremity   Skin:    General: Skin is warm and dry.     Capillary Refill: Capillary refill takes less than 2 seconds.   Neurological:     General: No focal deficit present.     Mental Status: He is alert and oriented to person, place, and time.   Psychiatric:        Mood and Affect: Mood normal.     (all labs ordered are listed, but only abnormal results are displayed) Labs Reviewed - No data to display  EKG: None  Radiology: No results found.   Procedures   Medications Ordered in the ED  fluorescein ophthalmic strip 1 strip (1 strip Right Eye Given 06/10/24 2128)  tetracaine (PONTOCAINE) 0.5 % ophthalmic solution 2 drop (2 drops Right Eye Given 06/10/24 2128)                                    Medical Decision Making Risk Prescription drug management.   Will evaluate Woods lamp and fluorescein staining.  Fluorescein staining corneas completely clear.  Anterior chambers clear as well.  Will have patient follow-up with on-call ophthalmology.  Do not see anything concerning for glaucoma or corneal ulcer or abrasion.  No significant eye pain.  No significant discharge.  Final diagnoses:  Foreign body sensation, right eye    ED Discharge Orders     None          Nicklas Barns, MD 06/10/24 2116    Nicklas Barns, MD 06/10/24 2212

## 2024-06-10 NOTE — ED Triage Notes (Signed)
 Pt arrives with c/o foreign body in his right eye. Pt unsure of what exactly is in his eye. Pt reports irration and redness.

## 2024-06-11 ENCOUNTER — Ambulatory Visit: Admitting: Pharmacist

## 2024-06-14 ENCOUNTER — Other Ambulatory Visit: Payer: Self-pay

## 2024-06-14 MED ORDER — AMOXICILLIN 500 MG PO CAPS
500.0000 mg | ORAL_CAPSULE | Freq: Three times a day (TID) | ORAL | 0 refills | Status: AC
  Filled 2024-06-14: qty 21, 7d supply, fill #0

## 2024-06-18 ENCOUNTER — Other Ambulatory Visit: Payer: Self-pay

## 2024-06-18 NOTE — Patient Outreach (Signed)
 Complex Care Management   Visit Note  06/18/2024  Name:  Travis Benson MRN: [id number] DOB: 06/11/1961  Situation: Referral received for Complex Care Management related to SDOH Barriers:  Food insecurity I obtained verbal consent from Patient.  Visit completed with patient  on the phone  Background:   Past Medical History:  Diagnosis Date   Arthritis    neck   Coronary artery disease    Familial hypercholesterolemia 12/26/2021   History of hepatitis C    Hypertension    Left scapula fracture 03/02/2019   PAD (peripheral artery disease) (HCC) 12/26/2021    Assessment: SW completed a telephone outreach with patient, he states he did receive the resources SW sent. Patient states his Medicaid recently changed to [facility] and he has appealed it. SW provided patient with her contact information for any future needs.   SDOH Interventions    Flowsheet Row Patient Outreach from 05/21/2024 in [place] POPULATION HEALTH DEPARTMENT Patient Outreach from 05/06/2024 in [facility] HEALTH POPULATION HEALTH DEPARTMENT Office Visit from 04/15/2024 in [facility] [place] - A Dept Of [place] Office Visit from 04/03/2023 in [facility] Health Comm Health [place] - A Dept Of [facility]min. [facility]  SDOH Interventions      Food Insecurity Interventions Community Resources Provided Other (Comment), Intervention Not Indicated  [Utilizes food banks and food stamps] Intervention Not Indicated Intervention Not Indicated  Housing Interventions -- AMB Referral Intervention Not Indicated Intervention Not Indicated  Transportation Interventions -- Intervention Not Indicated Intervention Not Indicated Intervention Not Indicated  Utilities Interventions -- Intervention Not Indicated Intervention Not Indicated Intervention Not Indicated  Alcohol Usage Interventions -- -- Intervention Not Indicated (Score <7) Intervention Not Indicated (Score <7)  Financial Strain Interventions --  -- Intervention Not Indicated Intervention Not Indicated  Physical Activity Interventions -- -- Intervention Not Indicated --  Stress Interventions -- -- Intervention Not Indicated Intervention Not Indicated  Social Connections Interventions -- -- -- Intervention Not Indicated  Health Literacy Interventions -- -- Intervention Not Indicated --    Recommendation:   No recommendations at this time.  Follow Up Plan:   Patient has met all care management goals. Care Management case will be closed. Patient has been provided contact information should new needs arise.   Valora Gear, Florestine Hurl, MHA [place]  [facility] Social Worker, Population Health [phone]

## 2024-06-18 NOTE — Patient Instructions (Signed)
 Visit Information  Mr. Courtwright was given information about Medicaid Managed Care team care coordination services as a part of their [facility] Community Plan Medicaid benefit. Yasiel Goyne verbally consented to engagement with the [facility] Managed Care team.   If you are experiencing a medical emergency, please call 911 or report to your local emergency department or urgent care.   If you have a non-emergency medical problem during routine business hours, please contact your provider's office and ask to speak with a nurse.   For questions related to your [facility], please call: [phone] or visit the homepage here: [email or web address]  If you would like to schedule transportation through your [facility], please call the following number at least 2 days in advance of your appointment: [phone]   Rides for urgent appointments can also be made after hours by calling Member Services.  Call the Behavioral Health Crisis Line at [phone], at any time, 24 hours a day, 7 days a week. If you are in danger or need immediate medical attention call 911.  If you would like help to quit smoking, call 1-800-QUIT-NOW (517-347-4447) OR Espaol: 1-855-Djelo-Ya (7-619-509-3267) o para ms informacin haga clic aqu or Text READY to 124-580 to register via text  Mr. Bushway - following are the goals we discussed in your visit today:   Goals Addressed             This Visit's Progress    COMPLETED: BSW VBCI Social Work Care Plan       Problems:   Food Insecurity   CSW Clinical Goal(s):   Over the next 15 days the Patient will explore community resource options for unmet needs related to Financial Strain .  Interventions:  Social Determinants of Health in Patient with food and utilties: SDOH assessments completed: Food Insecurity  Evaluation of current treatment plan related  to unmet needs Food resources: provided food resources via mail Patient not sure if resources for food have been received. SW resent resources to patient via mail and email on file.  Patient Goals/Self-Care Activities:  Patient will contact food pantries for assistance with food.   Plan:   The patient has been provided with contact information for the care management team and has been advised to call with any health related questions or concerns.           The  Patient                                              has been provided with contact information for the Managed Medicaid care management team and has been advised to call with any health related questions or concerns.   Valora Gear, Florestine Hurl, MHA [place]  [facility] Social Worker, Population Health [phone]   Following is a copy of your plan of care:  There are no care plans that you recently modified to display for this patient.

## 2024-06-21 ENCOUNTER — Telehealth: Payer: Self-pay

## 2024-06-21 NOTE — Patient Outreach (Signed)
 Attempted x2 to reach patient for CCM follow-up. No answer, voicemail box not set up. Will continue with outreach attempts.  Rosaline Finlay, RN MSN [place]  [facility]BCI Population Health RN Care Manager Direct Dial: [phone]  Fax: [phone]

## 2024-06-23 NOTE — Patient Outreach (Signed)
 Attempted to reach patient for CCM follow-up. No answer, voicemail box not set up. Will continue with outreach attempts.   Rosaline Finlay, RN MSN [place]  VBCI Population Health RN Care Manager Direct Dial: [phone]  Fax: [phone]

## 2024-06-25 ENCOUNTER — Encounter: Payer: Self-pay | Admitting: Cardiology

## 2024-06-25 NOTE — Patient Outreach (Signed)
 Attempted to reach patient for CCM follow-up. No answer, voicemail box not set up. Three unsuccessful attempts have been made to reach patient. No further attempts will be made at this time. Patient disenrolled from CCM. He is welcome to call the care team for any future needs.  Rosaline Finlay, RN MSN [place]  [facility]BCI Population Health RN Care Manager Direct Dial: [phone]  Fax: [phone]

## 2024-07-07 ENCOUNTER — Ambulatory Visit: Admitting: Cardiology

## 2024-08-13 ENCOUNTER — Ambulatory Visit: Admitting: Cardiology

## 2024-10-13 ENCOUNTER — Ambulatory Visit: Admitting: Family Medicine
